# Patient Record
Sex: Male | Born: 1935 | Race: Black or African American | Hispanic: No | Smoking: Current some day smoker
Health system: Southern US, Community
[De-identification: ages and names within clinical notes are randomized; demographics above are authoritative.]

## PROBLEM LIST (undated history)

## (undated) DIAGNOSIS — E611 Iron deficiency: Secondary | ICD-10-CM

## (undated) DIAGNOSIS — J449 Chronic obstructive pulmonary disease, unspecified: Secondary | ICD-10-CM

## (undated) DIAGNOSIS — D649 Anemia, unspecified: Secondary | ICD-10-CM

## (undated) DIAGNOSIS — F32A Depression, unspecified: Secondary | ICD-10-CM

## (undated) DIAGNOSIS — M109 Gout, unspecified: Secondary | ICD-10-CM

## (undated) DIAGNOSIS — F329 Major depressive disorder, single episode, unspecified: Secondary | ICD-10-CM

## (undated) DIAGNOSIS — N189 Chronic kidney disease, unspecified: Secondary | ICD-10-CM

## (undated) DIAGNOSIS — I10 Essential (primary) hypertension: Secondary | ICD-10-CM

## (undated) HISTORY — DX: Chronic kidney disease, unspecified: N18.9

## (undated) HISTORY — DX: Gout, unspecified: M10.9

## (undated) HISTORY — DX: Essential (primary) hypertension: I10

## (undated) HISTORY — PX: THYROIDECTOMY: SHX17

## (undated) HISTORY — PX: NO PAST SURGERIES: SHX2092

## (undated) HISTORY — DX: Iron deficiency: E61.1

---

## 2005-07-08 ENCOUNTER — Ambulatory Visit: Payer: Self-pay | Admitting: Family Medicine

## 2012-05-19 DIAGNOSIS — Z720 Tobacco use: Secondary | ICD-10-CM | POA: Insufficient documentation

## 2012-05-19 DIAGNOSIS — I10 Essential (primary) hypertension: Secondary | ICD-10-CM | POA: Insufficient documentation

## 2013-08-14 ENCOUNTER — Emergency Department: Payer: Self-pay | Admitting: Emergency Medicine

## 2014-01-16 DIAGNOSIS — N183 Chronic kidney disease, stage 3 unspecified: Secondary | ICD-10-CM | POA: Insufficient documentation

## 2014-01-16 DIAGNOSIS — M109 Gout, unspecified: Secondary | ICD-10-CM | POA: Insufficient documentation

## 2014-02-28 ENCOUNTER — Ambulatory Visit: Payer: Self-pay | Admitting: Ophthalmology

## 2014-02-28 DIAGNOSIS — I10 Essential (primary) hypertension: Secondary | ICD-10-CM

## 2014-02-28 LAB — POTASSIUM: Potassium: 4 mmol/L (ref 3.5–5.1)

## 2014-03-19 ENCOUNTER — Ambulatory Visit: Payer: Self-pay | Admitting: Ophthalmology

## 2014-06-26 ENCOUNTER — Ambulatory Visit: Payer: Self-pay | Admitting: Ophthalmology

## 2014-06-26 LAB — POTASSIUM: POTASSIUM: 4 mmol/L (ref 3.5–5.1)

## 2014-06-26 LAB — HEMOGLOBIN: HGB: 11.5 g/dL — AB (ref 13.0–18.0)

## 2014-07-09 ENCOUNTER — Ambulatory Visit: Payer: Self-pay | Admitting: Ophthalmology

## 2014-12-08 ENCOUNTER — Emergency Department: Payer: Self-pay | Admitting: Emergency Medicine

## 2014-12-08 LAB — COMPREHENSIVE METABOLIC PANEL
Albumin: 3 g/dL — ABNORMAL LOW
Alkaline Phosphatase: 94 U/L
Anion Gap: 8 (ref 7–16)
BUN: 48 mg/dL — ABNORMAL HIGH
Bilirubin,Total: 0.7 mg/dL
CO2: 29 mmol/L
Calcium, Total: 8.1 mg/dL — ABNORMAL LOW
Chloride: 99 mmol/L — ABNORMAL LOW
Creatinine: 3.17 mg/dL — ABNORMAL HIGH
EGFR (African American): 21 — ABNORMAL LOW
GFR CALC NON AF AMER: 18 — AB
Glucose: 95 mg/dL
Potassium: 4.3 mmol/L
SGOT(AST): 28 U/L
SGPT (ALT): 16 U/L — ABNORMAL LOW
Sodium: 136 mmol/L
TOTAL PROTEIN: 7.2 g/dL

## 2014-12-08 LAB — CBC WITH DIFFERENTIAL/PLATELET
BASOS ABS: 0.1 10*3/uL (ref 0.0–0.1)
Basophil %: 0.9 %
EOS ABS: 0 10*3/uL (ref 0.0–0.7)
Eosinophil %: 0.4 %
HCT: 35.5 % — AB (ref 40.0–52.0)
HGB: 11.4 g/dL — AB (ref 13.0–18.0)
Lymphocyte #: 0.9 10*3/uL — ABNORMAL LOW (ref 1.0–3.6)
Lymphocyte %: 12 %
MCH: 28.3 pg (ref 26.0–34.0)
MCHC: 32 g/dL (ref 32.0–36.0)
MCV: 89 fL (ref 80–100)
Monocyte #: 0.5 x10 3/mm (ref 0.2–1.0)
Monocyte %: 6.6 %
NEUTROS ABS: 6.1 10*3/uL (ref 1.4–6.5)
NEUTROS PCT: 80.1 %
Platelet: 226 10*3/uL (ref 150–440)
RBC: 4.01 10*6/uL — AB (ref 4.40–5.90)
RDW: 14.4 % (ref 11.5–14.5)
WBC: 7.6 10*3/uL (ref 3.8–10.6)

## 2014-12-08 LAB — PRO B NATRIURETIC PEPTIDE: B-Type Natriuretic Peptide: 1048 pg/mL — ABNORMAL HIGH

## 2014-12-12 DIAGNOSIS — N184 Chronic kidney disease, stage 4 (severe): Secondary | ICD-10-CM | POA: Insufficient documentation

## 2014-12-26 DIAGNOSIS — I119 Hypertensive heart disease without heart failure: Secondary | ICD-10-CM | POA: Insufficient documentation

## 2015-01-04 NOTE — Op Note (Signed)
PATIENT NAME:  Eric Yu, Eric Yu MR#:  [id number] DATE OF BIRTH:  1934/07/14  DATE OF PROCEDURE:  07/09/2014  PREOPERATIVE DIAGNOSIS:  Nuclear sclerotic cataract of the right eye.   POSTOPERATIVE DIAGNOSIS:  Nuclear sclerotic cataract of the right eye.   OPERATIVE PROCEDURE:  Cataract extraction by phacoemulsification with implant of intraocular lens to right eye.   SURGEON:  Galen ManilaWilliam Adalynd Donahoe, MD.   ANESTHESIA:  1. Managed anesthesia care.  2. Topical tetracaine drops followed by 2% Xylocaine jelly applied in the preoperative holding area.   COMPLICATIONS:  None.   TECHNIQUE:   Stop and chop.   DESCRIPTION OF PROCEDURE:  The patient was examined and consented in the preoperative holding area where the aforementioned topical anesthesia was applied to the right eye and then brought back to the Operating Room where the right eye was prepped and draped in the usual sterile ophthalmic fashion and a lid speculum was placed. A paracentesis was created with the side port blade and the anterior chamber was filled with viscoelastic. A near clear corneal incision was performed with the steel keratome. A continuous curvilinear capsulorrhexis was performed with a cystotome followed by the capsulorrhexis forceps. Hydrodissection and hydrodelineation were carried out with BSS on a blunt cannula. The lens was removed in a stop and chop technique and the remaining cortical material was removed with the irrigation-aspiration handpiece. The capsular bag was inflated with viscoelastic and the Tecnis ZCB00 23.5-diopter lens, serial number [id number] was placed in the capsular bag without complication. The remaining viscoelastic was removed from the eye with the irrigation-aspiration handpiece. The wounds were hydrated. The anterior chamber was flushed with Miostat and the eye was inflated to physiologic pressure. 0.1 mL of cefuroxime concentration 10 mg/mL was placed in the anterior chamber. The wounds were found to be water  tight. The eye was dressed with Vigamox. The patient was given protective glasses to wear throughout the day and a shield with which to sleep tonight. The patient was also given drops with which to begin a drop regimen today and will follow up with me in one day.    ____________________________ Jerilee FieldWilliam L. Antwoin Lackey, MD wlp:nb D: 07/09/2014 15:37:14 ET T: 07/10/2014 03:43:19 ET JOB#: [id number]  cc: Andrew Soria L. Zaydan Papesh, MD, <Dictator> Jerilee FieldWILLIAM L Corderro Koloski MD ELECTRONICALLY SIGNED 07/10/2014 14:13

## 2015-01-04 NOTE — Op Note (Signed)
PATIENT NAME:  Eric Yu, Eric Yu MR#:  [id number] DATE OF BIRTH:  1934/04/09  DATE OF PROCEDURE:  03/19/2014  PREOPERATIVE DIAGNOSIS: Visually significant cataract of the left eye.   POSTOPERATIVE DIAGNOSIS: Visually significant cataract of the left eye.   OPERATIVE PROCEDURE: Cataract extraction by phacoemulsification with implant of intraocular lens to left eye.   SURGEON: Galen ManilaWilliam Kevonta Phariss, MD.   ANESTHESIA:  1. Managed anesthesia care.  2. Topical tetracaine drops followed by 2% Xylocaine jelly applied in the preoperative holding area.   COMPLICATIONS: None.   TECHNIQUE:  Stop and chop.  DESCRIPTION OF PROCEDURE: The patient was examined and consented in the preoperative holding area where the aforementioned topical anesthesia was applied to the left eye and then brought back to the Operating Room where the left eye was prepped and draped in the usual sterile ophthalmic fashion and a lid speculum was placed. A paracentesis was created with the side port blade and the anterior chamber was filled with viscoelastic. A near clear corneal incision was performed with the steel keratome. A continuous curvilinear capsulorrhexis was performed with a cystotome followed by the capsulorrhexis forceps. Hydrodissection and hydrodelineation were carried out with BSS on a blunt cannula. The lens was removed in a stop and chop technique and the remaining cortical material was removed with the irrigation-aspiration handpiece. The capsular bag was inflated with viscoelastic and the Alcon SN60WF Tecnis ZCB00 24.0-diopter lens, serial number [id number], was placed in the capsular bag without complication. The remaining viscoelastic was removed from the eye with the irrigation-aspiration handpiece. The wounds were hydrated. The anterior chamber was flushed with Miostat and the eye was inflated to physiologic pressure. 0.1 mL of cefuroxime concentration 10 mg/mL was placed in the anterior chamber. The wounds were found to  be water tight. The eye was dressed with Vigamox. The patient was given protective glasses to wear throughout the day and a shield with which to sleep tonight. The patient was also given drops with which to begin a drop regimen today and will follow-up with me in one day.     ____________________________ Jerilee FieldWilliam L. Kaidan Harpster, MD wlp:cg D: 03/19/2014 22:39:44 ET T: 03/20/2014 01:45:54 ET JOB#: [id number]  cc: Massimo Hartland L. Sanoe Hazan, MD, <Dictator> Jerilee FieldWILLIAM L Alabama Doig MD ELECTRONICALLY SIGNED 04/10/2014 17:05

## 2015-01-06 DIAGNOSIS — IMO0002 Reserved for concepts with insufficient information to code with codable children: Secondary | ICD-10-CM | POA: Insufficient documentation

## 2015-01-08 DIAGNOSIS — S37009A Unspecified injury of unspecified kidney, initial encounter: Secondary | ICD-10-CM | POA: Insufficient documentation

## 2015-05-22 ENCOUNTER — Other Ambulatory Visit: Payer: Self-pay | Admitting: Internal Medicine

## 2015-05-22 DIAGNOSIS — N289 Disorder of kidney and ureter, unspecified: Secondary | ICD-10-CM

## 2015-05-28 ENCOUNTER — Ambulatory Visit
Admission: RE | Admit: 2015-05-28 | Discharge: 2015-05-28 | Disposition: A | Discharge status: Home / Self Care | Payer: Medicare HMO | Source: Ambulatory Visit | Attending: Internal Medicine | Admitting: Internal Medicine

## 2015-05-28 DIAGNOSIS — R7989 Other specified abnormal findings of blood chemistry: Secondary | ICD-10-CM | POA: Diagnosis not present

## 2015-05-28 DIAGNOSIS — N289 Disorder of kidney and ureter, unspecified: Secondary | ICD-10-CM | POA: Insufficient documentation

## 2015-07-29 ENCOUNTER — Ambulatory Visit (INDEPENDENT_AMBULATORY_CARE_PROVIDER_SITE_OTHER): Payer: Medicare HMO | Admitting: Urology

## 2015-07-29 ENCOUNTER — Encounter: Payer: Self-pay | Admitting: Urology

## 2015-07-29 VITALS — BP 170/74 | HR 102 | Ht 68.0 in | Wt 165.8 lb

## 2015-07-29 DIAGNOSIS — N4889 Other specified disorders of penis: Secondary | ICD-10-CM | POA: Diagnosis not present

## 2015-07-29 DIAGNOSIS — N489 Disorder of penis, unspecified: Secondary | ICD-10-CM

## 2015-07-29 MED ORDER — CEPHALEXIN 500 MG PO CAPS: 500.0000 mg | ORAL_CAPSULE | Freq: Three times a day (TID) | ORAL | Status: AC

## 2015-07-29 MED ORDER — CEPHALEXIN 500 MG PO CAPS: 500.0000 mg | ORAL_CAPSULE | Freq: Four times a day (QID) | ORAL | Status: DC

## 2015-07-29 NOTE — Progress Notes (Signed)
81 year old male who presents today to clinic for evaluation and management of a penile lesion.   The patient was referred by Dr. Einar CrowMarshall Anderson, M.D.   The patient states that his lesion has been present for the past 7 days.  It is located on the dorsal aspect the corona with involvement of the foreskin in that area. The area has not enlarged over the past week.  He was started on fluconazole for 5 days and had little improvement in the size and character of the lesion. The patient denies any associated pain. He denies any purulence. He denies any associated trauma. The patient states that prior to this the area was normal in appearance. Denies any fevers or chills. Denies a progressive voiding symptoms.  The patient denies any inguinal lymph adenopathy or associated inguinal pain.The patient does have urinary incontinence baseline.  The patient also has chronic kidney disease. He is a nondiabetic.  No current outpatient prescriptions on file prior to visit.   No current facility-administered medications on file prior to visit.   Past Medical History  Diagnosis Date  . Hypertension   . Gout   . Iron deficiency   . Chronic kidney disease    Past Surgical History  Procedure Laterality Date  . No past surgeries    . Thyroidectomy     Social History  Substance Use Topics  . Smoking status: Current Every Day Smoker  . Smokeless tobacco: Never Used  . Alcohol Use: No   Family History  Problem Relation Age of Onset  . Cancer Sister   . Hypertension Sister   . Urolithiasis Neg Hx   . Prostate cancer Neg Hx   . Kidney disease Neg Hx   . Kidney cancer Neg Hx    NAD Filed Vitals:   07/29/15 1431  Height: 5\' 8"  (1.727 m)  Weight: 165 lb 12.8 oz (75.206 kg)   alert and oriented 3  Nonlabored breathing  No significant lower extremities swelling ,  Extremities well-perfused  abdomen is soft, nontender, nondistended  No inguinal lymphadenopathy  Patient has proximally 1 cm lesion  on the left side  On the dorsal tip of the phallus encroaching on the glans and extending into the foreskin. The skin around is indurated. There is some fibrinous  Exudate around the ulcerative lesion.   This is an isolated lesion, there are no additional lesions on the phallus within the genitalia. Scrotum is otherwise normal.  Extremities are symmetric without pitting edema  Skin is otherwise unremarkable  Neurologically, the patient appears to be intact grossly  The patient has a non-labile mood with a bright affect.  Imp:  The patient appears to have an infection along the distal shaft of the penis involving the foreskin and extending into the corona of the glans penis. He experienced no improvement on fluconazole. According to the patient, this is a new lesion. Recommendation: I recommended that the patient be treated with one week of Keflex every 8 hours. In addition, I instructed the patient on cleaning the area well twice daily and then applying Neosporin to the effected area. I'll plan to follow up with the patient 10 days. At that point, if  the patient's lesion is no better, he may need a biopsy or circumcision of the area. This would be both curative and diagnostic of any penile neoplasm.   Cc: Dr. Einar CrowMarshall Anderson, M.D.

## 2015-08-13 ENCOUNTER — Ambulatory Visit: Payer: Medicare HMO

## 2016-04-26 ENCOUNTER — Emergency Department: Payer: Medicare HMO

## 2016-04-26 ENCOUNTER — Inpatient Hospital Stay
Admission: EM | Admit: 2016-04-26 | Discharge: 2016-05-03 | DRG: 682 | Disposition: A | Discharge status: Home Health | Payer: Medicare HMO | Attending: Internal Medicine | Admitting: Internal Medicine

## 2016-04-26 ENCOUNTER — Encounter
Admission: EM | Disposition: A | Discharge status: Home Health | Payer: Self-pay | Source: Home / Self Care | Attending: Internal Medicine

## 2016-04-26 DIAGNOSIS — D631 Anemia in chronic kidney disease: Secondary | ICD-10-CM | POA: Diagnosis present

## 2016-04-26 DIAGNOSIS — I248 Other forms of acute ischemic heart disease: Secondary | ICD-10-CM | POA: Diagnosis present

## 2016-04-26 DIAGNOSIS — N179 Acute kidney failure, unspecified: Secondary | ICD-10-CM

## 2016-04-26 DIAGNOSIS — N186 End stage renal disease: Secondary | ICD-10-CM | POA: Diagnosis present

## 2016-04-26 DIAGNOSIS — M109 Gout, unspecified: Secondary | ICD-10-CM | POA: Diagnosis present

## 2016-04-26 DIAGNOSIS — Z8249 Family history of ischemic heart disease and other diseases of the circulatory system: Secondary | ICD-10-CM | POA: Diagnosis not present

## 2016-04-26 DIAGNOSIS — Z992 Dependence on renal dialysis: Secondary | ICD-10-CM | POA: Diagnosis not present

## 2016-04-26 DIAGNOSIS — I472 Ventricular tachycardia: Secondary | ICD-10-CM | POA: Diagnosis present

## 2016-04-26 DIAGNOSIS — Z87891 Personal history of nicotine dependence: Secondary | ICD-10-CM | POA: Diagnosis not present

## 2016-04-26 DIAGNOSIS — E872 Acidosis: Secondary | ICD-10-CM | POA: Diagnosis present

## 2016-04-26 DIAGNOSIS — N189 Chronic kidney disease, unspecified: Secondary | ICD-10-CM

## 2016-04-26 DIAGNOSIS — N2581 Secondary hyperparathyroidism of renal origin: Secondary | ICD-10-CM | POA: Diagnosis present

## 2016-04-26 DIAGNOSIS — R45851 Suicidal ideations: Secondary | ICD-10-CM | POA: Diagnosis not present

## 2016-04-26 DIAGNOSIS — G4700 Insomnia, unspecified: Secondary | ICD-10-CM | POA: Diagnosis present

## 2016-04-26 DIAGNOSIS — E8770 Fluid overload, unspecified: Secondary | ICD-10-CM | POA: Diagnosis present

## 2016-04-26 DIAGNOSIS — I120 Hypertensive chronic kidney disease with stage 5 chronic kidney disease or end stage renal disease: Principal | ICD-10-CM | POA: Diagnosis present

## 2016-04-26 DIAGNOSIS — E871 Hypo-osmolality and hyponatremia: Secondary | ICD-10-CM | POA: Diagnosis present

## 2016-04-26 DIAGNOSIS — Z79899 Other long term (current) drug therapy: Secondary | ICD-10-CM

## 2016-04-26 DIAGNOSIS — IMO0001 Reserved for inherently not codable concepts without codable children: Secondary | ICD-10-CM

## 2016-04-26 DIAGNOSIS — R531 Weakness: Secondary | ICD-10-CM

## 2016-04-26 DIAGNOSIS — E875 Hyperkalemia: Secondary | ICD-10-CM | POA: Diagnosis present

## 2016-04-26 DIAGNOSIS — F322 Major depressive disorder, single episode, severe without psychotic features: Secondary | ICD-10-CM | POA: Diagnosis present

## 2016-04-26 HISTORY — PX: PERIPHERAL VASCULAR CATHETERIZATION: SHX172C

## 2016-04-26 LAB — RENAL FUNCTION PANEL
ANION GAP: 13 (ref 5–15)
Albumin: 3.4 g/dL — ABNORMAL LOW (ref 3.5–5.0)
BUN: 82 mg/dL — ABNORMAL HIGH (ref 6–20)
CHLORIDE: 100 mmol/L — AB (ref 101–111)
CO2: 21 mmol/L — AB (ref 22–32)
Calcium: 8.9 mg/dL (ref 8.9–10.3)
Creatinine, Ser: 7.45 mg/dL — ABNORMAL HIGH (ref 0.61–1.24)
GFR calc non Af Amer: 6 mL/min — ABNORMAL LOW (ref >60)
GFR, EST AFRICAN AMERICAN: 7 mL/min — AB (ref >60)
Glucose, Bld: 83 mg/dL (ref 65–99)
Phosphorus: 5.4 mg/dL — ABNORMAL HIGH (ref 2.5–4.6)
Potassium: 4.8 mmol/L (ref 3.5–5.1)
Sodium: 134 mmol/L — ABNORMAL LOW (ref 135–145)

## 2016-04-26 LAB — CBC
HCT: 29.8 % — ABNORMAL LOW (ref 40.0–52.0)
HEMATOCRIT: 30.1 % — AB (ref 40.0–52.0)
HEMOGLOBIN: 10 g/dL — AB (ref 13.0–18.0)
HEMOGLOBIN: 9.8 g/dL — AB (ref 13.0–18.0)
MCH: 28 pg (ref 26.0–34.0)
MCH: 27.7 pg (ref 26.0–34.0)
MCHC: 33.3 g/dL (ref 32.0–36.0)
MCHC: 32.9 g/dL (ref 32.0–36.0)
MCV: 84 fL (ref 80.0–100.0)
MCV: 84.3 fL (ref 80.0–100.0)
PLATELETS: 239 10*3/uL (ref 150–440)
Platelets: 235 10*3/uL (ref 150–440)
RBC: 3.59 MIL/uL — ABNORMAL LOW (ref 4.40–5.90)
RBC: 3.54 MIL/uL — AB (ref 4.40–5.90)
RDW: 16.8 % — AB (ref 11.5–14.5)
RDW: 16.8 % — ABNORMAL HIGH (ref 11.5–14.5)
WBC: 4.6 10*3/uL (ref 3.8–10.6)
WBC: 7 10*3/uL (ref 3.8–10.6)

## 2016-04-26 LAB — BASIC METABOLIC PANEL
ANION GAP: 15 (ref 5–15)
BUN: 118 mg/dL — ABNORMAL HIGH (ref 6–20)
CHLORIDE: 96 mmol/L — AB (ref 101–111)
CO2: 19 mmol/L — AB (ref 22–32)
Calcium: 8.9 mg/dL (ref 8.9–10.3)
Creatinine, Ser: 9.43 mg/dL — ABNORMAL HIGH (ref 0.61–1.24)
GFR calc Af Amer: 5 mL/min — ABNORMAL LOW (ref >60)
GFR, EST NON AFRICAN AMERICAN: 5 mL/min — AB (ref >60)
GLUCOSE: 102 mg/dL — AB (ref 65–99)
POTASSIUM: 5.2 mmol/L — AB (ref 3.5–5.1)
Sodium: 130 mmol/L — ABNORMAL LOW (ref 135–145)

## 2016-04-26 LAB — URINALYSIS COMPLETE WITH MICROSCOPIC (ARMC ONLY)
BILIRUBIN URINE: NEGATIVE
Bacteria, UA: NONE SEEN
Glucose, UA: NEGATIVE mg/dL
Hgb urine dipstick: NEGATIVE
KETONES UR: NEGATIVE mg/dL
LEUKOCYTES UA: NEGATIVE
Nitrite: NEGATIVE
PH: 5 (ref 5.0–8.0)
PROTEIN: 100 mg/dL — AB
SPECIFIC GRAVITY, URINE: 1.008 (ref 1.005–1.030)
Squamous Epithelial / LPF: NONE SEEN

## 2016-04-26 LAB — TROPONIN I: Troponin I: 0.09 ng/mL (ref <0.03)

## 2016-04-26 LAB — CREATININE, SERUM
Creatinine, Ser: 7.45 mg/dL — ABNORMAL HIGH (ref 0.61–1.24)
GFR calc Af Amer: 7 mL/min — ABNORMAL LOW (ref >60)
GFR calc non Af Amer: 6 mL/min — ABNORMAL LOW (ref >60)

## 2016-04-26 LAB — GLUCOSE, CAPILLARY: Glucose-Capillary: 99 mg/dL (ref 65–99)

## 2016-04-26 SURGERY — DIALYSIS/PERMA CATHETER INSERTION
Anesthesia: Moderate Sedation

## 2016-04-26 MED ORDER — NIFEDIPINE ER OSMOTIC RELEASE 30 MG PO TB24
30.0000 mg | ORAL_TABLET | Freq: Every day | ORAL | Status: DC
  Administered 2016-04-26 – 2016-05-03 (×7): 30 mg via ORAL
  Filled 2016-04-26 (×10): qty 1

## 2016-04-26 MED ORDER — SODIUM CHLORIDE 0.9 % IV SOLN: 250.0000 mL | INTRAVENOUS | Status: DC | PRN

## 2016-04-26 MED ORDER — HEPARIN SODIUM (PORCINE) 5000 UNIT/ML IJ SOLN
5000.0000 [IU] | Freq: Three times a day (TID) | INTRAMUSCULAR | Status: DC
  Administered 2016-04-26 – 2016-05-02 (×17): 5000 [IU] via SUBCUTANEOUS
  Filled 2016-04-26 (×17): qty 1

## 2016-04-26 MED ORDER — ALTEPLASE 2 MG IJ SOLR
2.0000 mg | Freq: Once | INTRAMUSCULAR | Status: DC | PRN
  Filled 2016-04-26: qty 2

## 2016-04-26 MED ORDER — SODIUM CHLORIDE 0.9% FLUSH
3.0000 mL | Freq: Two times a day (BID) | INTRAVENOUS | Status: DC
  Administered 2016-04-26 – 2016-05-03 (×12): 3 mL via INTRAVENOUS

## 2016-04-26 MED ORDER — SENNOSIDES-DOCUSATE SODIUM 8.6-50 MG PO TABS: 1.0000 | ORAL_TABLET | Freq: Every evening | ORAL | Status: DC | PRN

## 2016-04-26 MED ORDER — ACETAMINOPHEN 650 MG RE SUPP: 650.0000 mg | Freq: Four times a day (QID) | RECTAL | Status: DC | PRN

## 2016-04-26 MED ORDER — ACETAMINOPHEN 325 MG PO TABS: 650.0000 mg | ORAL_TABLET | Freq: Four times a day (QID) | ORAL | Status: DC | PRN

## 2016-04-26 MED ORDER — HYDRALAZINE HCL 50 MG PO TABS
100.0000 mg | ORAL_TABLET | Freq: Three times a day (TID) | ORAL | Status: DC
  Administered 2016-04-26 – 2016-05-03 (×16): 100 mg via ORAL
  Filled 2016-04-26 (×17): qty 2

## 2016-04-26 MED ORDER — SODIUM CHLORIDE 0.9 % IV SOLN: INTRAVENOUS | Status: DC

## 2016-04-26 MED ORDER — HEPARIN SODIUM (PORCINE) 10000 UNIT/ML IJ SOLN
INTRAMUSCULAR | Status: AC
Start: 2016-04-26 — End: 2016-04-26
  Filled 2016-04-26: qty 1

## 2016-04-26 MED ORDER — ALLOPURINOL 100 MG PO TABS
100.0000 mg | ORAL_TABLET | Freq: Every day | ORAL | Status: DC
  Administered 2016-04-26 – 2016-05-03 (×6): 100 mg via ORAL
  Filled 2016-04-26 (×6): qty 1

## 2016-04-26 MED ORDER — LISINOPRIL 20 MG PO TABS
20.0000 mg | ORAL_TABLET | Freq: Every day | ORAL | Status: DC
  Administered 2016-04-26 – 2016-04-27 (×2): 20 mg via ORAL
  Filled 2016-04-26 (×2): qty 1

## 2016-04-26 MED ORDER — ONDANSETRON HCL 4 MG PO TABS: 4.0000 mg | ORAL_TABLET | Freq: Four times a day (QID) | ORAL | Status: DC | PRN

## 2016-04-26 MED ORDER — SODIUM CHLORIDE 0.9 % IV SOLN
Freq: Once | INTRAVENOUS | Status: AC
  Administered 2016-04-26: 11:00:00 via INTRAVENOUS

## 2016-04-26 MED ORDER — FENTANYL CITRATE (PF) 100 MCG/2ML IJ SOLN
INTRAMUSCULAR | Status: DC | PRN
  Administered 2016-04-26: 50 ug via INTRAVENOUS

## 2016-04-26 MED ORDER — VITAMIN D 1000 UNITS PO TABS
1000.0000 [IU] | ORAL_TABLET | Freq: Every day | ORAL | Status: DC
  Administered 2016-04-26 – 2016-05-03 (×6): 1000 [IU] via ORAL
  Filled 2016-04-26 (×6): qty 1

## 2016-04-26 MED ORDER — SODIUM CHLORIDE 0.9 % IV SOLN: 100.0000 mL | INTRAVENOUS | Status: DC | PRN

## 2016-04-26 MED ORDER — AMLODIPINE BESYLATE 10 MG PO TABS
10.0000 mg | ORAL_TABLET | Freq: Every day | ORAL | Status: DC
  Administered 2016-04-26 – 2016-05-03 (×7): 10 mg via ORAL
  Filled 2016-04-26 (×7): qty 1

## 2016-04-26 MED ORDER — PENTAFLUOROPROP-TETRAFLUOROETH EX AERO
1.0000 "application " | INHALATION_SPRAY | CUTANEOUS | Status: DC | PRN
  Filled 2016-04-26: qty 30

## 2016-04-26 MED ORDER — ASPIRIN EC 81 MG PO TBEC
81.0000 mg | DELAYED_RELEASE_TABLET | Freq: Every day | ORAL | Status: DC
  Administered 2016-04-26 – 2016-05-03 (×6): 81 mg via ORAL
  Filled 2016-04-26 (×6): qty 1

## 2016-04-26 MED ORDER — LIDOCAINE HCL (PF) 1 % IJ SOLN
5.0000 mL | INTRAMUSCULAR | Status: DC | PRN
  Filled 2016-04-26: qty 5

## 2016-04-26 MED ORDER — SODIUM CHLORIDE 0.9 % IV SOLN
INTRAVENOUS | Status: DC
  Administered 2016-04-26: 14:00:00 via INTRAVENOUS

## 2016-04-26 MED ORDER — ALBUTEROL SULFATE (2.5 MG/3ML) 0.083% IN NEBU
2.5000 mg | INHALATION_SOLUTION | Freq: Every day | RESPIRATORY_TRACT | Status: DC
  Administered 2016-04-26 – 2016-05-03 (×7): 2.5 mg via RESPIRATORY_TRACT
  Filled 2016-04-26 (×8): qty 3

## 2016-04-26 MED ORDER — ACETAMINOPHEN 325 MG PO TABS: 325.0000 mg | ORAL_TABLET | Freq: Every day | ORAL | Status: DC

## 2016-04-26 MED ORDER — LIDOCAINE-PRILOCAINE 2.5-2.5 % EX CREA
1.0000 "application " | TOPICAL_CREAM | CUTANEOUS | Status: DC | PRN
  Filled 2016-04-26: qty 5

## 2016-04-26 MED ORDER — FERROUS SULFATE 325 (65 FE) MG PO TABS
325.0000 mg | ORAL_TABLET | Freq: Every day | ORAL | Status: DC
  Administered 2016-04-26 – 2016-05-03 (×7): 325 mg via ORAL
  Filled 2016-04-26 (×7): qty 1

## 2016-04-26 MED ORDER — FENTANYL CITRATE (PF) 100 MCG/2ML IJ SOLN
INTRAMUSCULAR | Status: AC
  Filled 2016-04-26: qty 2

## 2016-04-26 MED ORDER — CEFUROXIME SODIUM 1.5 G IJ SOLR: 1.5000 g | Freq: Three times a day (TID) | INTRAMUSCULAR | Status: DC

## 2016-04-26 MED ORDER — CEFUROXIME SODIUM 1.5 G IJ SOLR
1.5000 g | Freq: Three times a day (TID) | INTRAMUSCULAR | Status: DC
  Administered 2016-04-26: 1.5 g via INTRAVENOUS

## 2016-04-26 MED ORDER — ONDANSETRON HCL 4 MG/2ML IJ SOLN
4.0000 mg | Freq: Four times a day (QID) | INTRAMUSCULAR | Status: DC | PRN
  Administered 2016-04-27: 4 mg via INTRAVENOUS
  Filled 2016-04-26: qty 2

## 2016-04-26 MED ORDER — HEPARIN SODIUM (PORCINE) 1000 UNIT/ML DIALYSIS
1000.0000 [IU] | INTRAMUSCULAR | Status: DC | PRN
  Filled 2016-04-26: qty 1

## 2016-04-26 MED ORDER — CLONIDINE HCL 0.1 MG PO TABS
0.2000 mg | ORAL_TABLET | Freq: Two times a day (BID) | ORAL | Status: DC
  Administered 2016-04-26 – 2016-05-03 (×13): 0.2 mg via ORAL
  Filled 2016-04-26 (×13): qty 2

## 2016-04-26 MED ORDER — MIDAZOLAM HCL 2 MG/2ML IJ SOLN
INTRAMUSCULAR | Status: DC | PRN
  Administered 2016-04-26: 2 mg via INTRAVENOUS

## 2016-04-26 MED ORDER — MIDAZOLAM HCL 2 MG/2ML IJ SOLN
INTRAMUSCULAR | Status: AC
  Filled 2016-04-26: qty 4

## 2016-04-26 MED ORDER — SODIUM CHLORIDE 0.9% FLUSH: 3.0000 mL | INTRAVENOUS | Status: DC | PRN

## 2016-04-26 SURGICAL SUPPLY — 9 items
BIOPATCH RED 1 DISK 7.0 (GAUZE/BANDAGES/DRESSINGS) ×2 IMPLANT
BIOPATCH RED 1IN DISK 7.0MM (GAUZE/BANDAGES/DRESSINGS) ×1
CATH PALINDROME RT-P 15FX19CM (CATHETERS) ×3 IMPLANT
DERMABOND ADVANCED (GAUZE/BANDAGES/DRESSINGS) ×2
DERMABOND ADVANCED .7 DNX12 (GAUZE/BANDAGES/DRESSINGS) ×1 IMPLANT
PACK ANGIOGRAPHY (CUSTOM PROCEDURE TRAY) ×3 IMPLANT
SUT MNCRL AB 4-0 PS2 18 (SUTURE) ×3 IMPLANT
SUT PROLENE 0 CT 1 30 (SUTURE) ×3 IMPLANT
TOWEL OR 17X26 4PK STRL BLUE (TOWEL DISPOSABLE) ×3 IMPLANT

## 2016-04-26 NOTE — ED Notes (Signed)
Pt's sister is stepping out and left her cell number: [phone] Jeanelle MallingErma Lloyd

## 2016-04-26 NOTE — Progress Notes (Signed)
END OF HD 

## 2016-04-26 NOTE — ED Notes (Signed)
Pt transported to specials. 

## 2016-04-26 NOTE — Progress Notes (Signed)
HD START 

## 2016-04-26 NOTE — ED Notes (Signed)
Pt transported to dialysis

## 2016-04-26 NOTE — Op Note (Signed)
OPERATIVE NOTE    PRE-OPERATIVE DIAGNOSIS: 1. ESRD  POST-OPERATIVE DIAGNOSIS: same as above  PROCEDURE: 1. Ultrasound guidance for vascular access to the right internal jugular vein 2. Fluoroscopic guidance for placement of catheter 3. Placement of a 19 cm tip to cuff tunneled hemodialysis catheter via the right internal jugular vein  SURGEON: Festus BarrenJason Clarabel Marion, MD  ANESTHESIA:  Local with Moderate conscious sedation for approximately 20 minutes using 2 mg of Versed and 50 mcg of Fentanyl  ESTIMATED BLOOD LOSS: 20 cc  FLUORO TIME: 0.1 minutes  CONTRAST: 0 cc  FINDING(S): 1.  Patent right internal jugular vein  SPECIMEN(S):  None  INDICATIONS:   Eric Yu is a 82 y.o. male who presents with uremic symptoms and known CKD now progressed to ESRD.  He needs to start HD today.  The patient needs long term dialysis access for their ESRD, and a Permcath is necessary.  Risks and benefits are discussed and informed consent is obtained.    DESCRIPTION: After obtaining full informed written consent, the patient was brought back to the vascular suited. The patient's right neck and chest were sterilely prepped and draped in a sterile surgical field was created. Moderate conscious sedation was administered during a face to face encounter with the patient throughout the procedure with my supervision of the RN administering medicines and monitoring the patient's vital signs, pulse oximetry, telemetry and mental status throughout from the start of the procedure until the patient was taken to the recovery room.  The right internal jugular vein was visualized with ultrasound and found to be patent. It was then accessed under direct ultrasound guidance and a permanent image was recorded. A wire was placed. After skin nick and dilatation, the peel-away sheath was placed over the wire. I then turned my attention to an area under the clavicle. Approximately 1-2 fingerbreadths below the clavicle a small  counterincision was created and tunneled from the subclavicular incision to the access site. Using fluoroscopic guidance, a 19 centimeter tip to cuff tunneled hemodialysis catheter was selected, and tunneled from the subclavicular incision to the access site. It was then placed through the peel-away sheath and the peel-away sheath was removed. Using fluoroscopic guidance the catheter tips were parked in the right atrium. The appropriate distal connectors were placed. It withdrew blood well and flushed easily with heparinized saline and a concentrated heparin solution was then placed. It was secured to the chest wall with 2 Prolene sutures. The access incision was closed single 4-0 Monocryl. A 4-0 Monocryl pursestring suture was placed around the exit site. Sterile dressings were placed. The patient tolerated the procedure well and was taken to the recovery room in stable condition.  COMPLICATIONS: None  CONDITION: Stable  Faizah Kandler  04/26/2016, 2:29 PM

## 2016-04-26 NOTE — Progress Notes (Signed)
POST HD VITALS 

## 2016-04-26 NOTE — ED Notes (Signed)
[facility], pt was started on Lasix and a Nicotine patch, pt woke up this morning stating he " felt not right", pt unable to verbalize symptoms, pt denies pain

## 2016-04-26 NOTE — ED Provider Notes (Addendum)
[facility] Emergency Department Provider Note        Time seen: ----------------------------------------- 9:23 AM on 04/26/2016 -----------------------------------------    I have reviewed the triage vital signs and the nursing notes.   HISTORY  Chief Complaint Weakness    HPI Eric Yu is a 82 y.o. male who presents to ER being brought by EMS from home complaining of generalized ill feeling.Patient states he's had poor by mouth intake, has had some shortness of breath and diarrhea. He states recently his doctor has changed some of his medicines. He is not sure which. He denies fevers, chills, chest pain, vomiting or other complaints at this time.   Past Medical History:  Diagnosis Date  . Chronic kidney disease   . Gout   . Hypertension   . Iron deficiency     Patient Active Problem List   Diagnosis Date Noted  . Injury of kidney 01/08/2015  . Blood loss, postoperative 01/06/2015  . Hypertensive left ventricular hypertrophy 12/26/2014  . Chronic kidney disease, stage IV (severe) (HCC) 12/12/2014  . Chronic kidney disease (CKD), stage III (moderate) 01/16/2014  . Gout 01/16/2014  . Benign hypertension 05/19/2012  . Current tobacco use 05/19/2012    Past Surgical History:  Procedure Laterality Date  . NO PAST SURGERIES    . THYROIDECTOMY      Allergies Review of patient's allergies indicates no known allergies.  Social History Social History  Substance Use Topics  . Smoking status: Former Smoker    Quit date: 04/16/2016  . Smokeless tobacco: Never Used     Comment: pt is currently using nicotine patches  . Alcohol use No    Review of Systems Constitutional: Negative for fever. Cardiovascular: Negative for chest pain. Respiratory: Positive shortness of breath Gastrointestinal: Negative for abdominal pain, Positive for diarrhea Genitourinary: Negative for dysuria. Musculoskeletal: Negative for back pain. Skin: Negative for  rash. Neurological: Negative for headaches, Positive for weakness  10-point ROS otherwise negative.  ____________________________________________   PHYSICAL EXAM:  VITAL SIGNS: ED Triage Vitals  Enc Vitals Group     BP 04/26/16 0909 (!) 180/57     Pulse Rate 04/26/16 0909 61     Resp 04/26/16 0909 18     Temp 04/26/16 0909 97.9 F (36.6 C)     Temp Source 04/26/16 0909 Oral     SpO2 04/26/16 0909 96 %     Weight 04/26/16 0911 159 lb (72.1 kg)     Height 04/26/16 0911 5\' 8"  (1.727 m)     Head Circumference --      Peak Flow --      Pain Score --      Pain Loc --      Pain Edu? --      Excl. in GC? --     Constitutional: Alert and oriented. No acute distress Eyes: Conjunctivae are normal. PERRL. Normal extraocular movements. ENT   Head: Normocephalic and atraumatic.   Nose: No congestion/rhinnorhea.   Mouth/Throat: Mucous membranes are moist.   Neck: No stridor. Cardiovascular: Normal rate, regular rhythm. No murmurs, rubs, or gallops. Respiratory: Normal respiratory effort without tachypnea nor retractions. Breath sounds are clear and equal bilaterally. No wheezes/rales/rhonchi. Gastrointestinal: Soft and nontender. Normal bowel sounds Musculoskeletal: Nontender with normal range of motion in all extremities. No lower extremity tenderness nor edema. Neurologic:  Normal speech and language. No gross focal neurologic deficits are appreciated. Generalized weakness, nothing focal. Skin:  Skin is warm, dry and intact. No rash noted.  Psychiatric: Mood and affect are normal. Speech and behavior are normal.  ____________________________________________  EKG: Interpreted by me.Sinus rhythm with a rate of 59 bpm, first-degree AV block, normal QRS size, normal QT interval. Possible old anterior infarct, nonspecific T wave inversion  ____________________________________________  ED COURSE:  Pertinent labs & imaging results that were available during my care of the  patient were reviewed by me and considered in my medical decision making (see chart for details). Clinical Course  Patient with vague weakness complaint. We will check basic labs and imaging.  Procedures ____________________________________________   LABS (pertinent positives/negatives)  Labs Reviewed  BASIC METABOLIC PANEL - Abnormal; Notable for the following:       Result Value   Sodium 130 (*)    Potassium 5.2 (*)    Chloride 96 (*)    CO2 19 (*)    Glucose, Bld 102 (*)    BUN 118 (*)    Creatinine, Ser 9.43 (*)    GFR calc non Af Amer 5 (*)    GFR calc Af Amer 5 (*)    All other components within normal limits  CBC - Abnormal; Notable for the following:    RBC 3.59 (*)    Hemoglobin 10.0 (*)    HCT 30.1 (*)    RDW 16.8 (*)    All other components within normal limits  GLUCOSE, CAPILLARY  URINALYSIS COMPLETEWITH MICROSCOPIC ([facility] ONLY)  TROPONIN I  CBG MONITORING, ED    RADIOLOGY Images were viewed by me  CT head, chest x-ray IMPRESSION: Nodular density overlying the right mid lung. Further evaluation by means of CT of the chest is recommended. IMPRESSION: Nodular density overlying the right mid lung. Further evaluation by means of CT of the chest is recommended.  ____________________________________________  FINAL ASSESSMENT AND PLAN  Weakness, Acute on chronic renal failure, Elevated troponin  Plan: Patient with labs and imaging as dictated above. Patient presents with weakness likely from his renal failure. I will discuss with nephrology and the hospitalist for admission. Patient will likely need dialysis if this does not improve. I have started him on a gentle saline infusion. Patient's troponin elevation is likely due to renal failure. He has not had chest pain.   Emily FilbertWilliams, Dana Dorner E, MD   Note: This dictation was prepared with Dragon dictation. Any transcriptional errors that result from this process are unintentional    Emily FilbertJonathan E Salinda Snedeker,  MD 04/26/16 1105    Emily FilbertJonathan E Brettany Sydney, MD 04/26/16 1116

## 2016-04-26 NOTE — ED Triage Notes (Signed)
Pt comes into the ED via EMS from home with c/o generalized feeling of just not feeling well today.. Denies any pain , pt is in NAD, SR noted with PVC..Marland Kitchen

## 2016-04-26 NOTE — Consult Note (Signed)
[facility] Vascular Consult Note  MRN : [id number]  Eric Yu is a 82 y.o. (08-14-1934) male who presents with chief complaint of  Chief Complaint  Patient presents with  . Weakness  .  History of Present Illness: I am asked by Dr. Cherylann Ratel to see the patient for dialysis access.  The patient presents with shortness of breath and mental status changes. He has known chronic kidney disease but has now progressed to what appears to be end-stage renal failure. His creatinine clearance is only 5 and his BUN is just over 100. He has chronic hypertension as an underlying cause and is quite hypertensive today. He has shortness of breath even at rest and this has progressed steadily over several days. He is also become more confused and lethargic. He has no fever or chills. He does not have chest pain.  Current Facility-Administered Medications  Medication Dose Route Frequency Provider Last Rate Last Dose  . 0.9 %  sodium chloride infusion   Intravenous Continuous Annice Needy, MD      . cefUROXime (ZINACEF) injection 1.5 g  1.5 g Intravenous Q8H Annice Needy, MD   1.5 g at 04/26/16 1401  . fentaNYL (SUBLIMAZE) injection    PRN Annice Needy, MD   50 mcg at 04/26/16 1401  . midazolam (VERSED) injection    PRN Annice Needy, MD   2 mg at 04/26/16 1402    Past Medical History:  Diagnosis Date  . Chronic kidney disease   . Gout   . Hypertension   . Iron deficiency     Past Surgical History:  Procedure Laterality Date  . NO PAST SURGERIES    . THYROIDECTOMY      Social History Social History  Substance Use Topics  . Smoking status: Former Smoker    Quit date: 04/16/2016  . Smokeless tobacco: Never Used     Comment: pt is currently using nicotine patches  . Alcohol use No  No IV drug use  Family History Family History  Problem Relation Age of Onset  . Cancer Sister   . Hypertension Sister   . Urolithiasis Neg Hx   . Prostate cancer Neg Hx   . Kidney disease Neg  Hx   . Kidney cancer Neg Hx   No bleeding or clotting disorders  No Known Allergies   REVIEW OF SYSTEMS (Negative unless checked)  Constitutional: [] Weight loss  [] Fever  [] Chills Cardiac: [] Chest pain   [] Chest pressure   [] Palpitations   [x] Shortness of breath when laying flat   [] Shortness of breath at rest   [x] Shortness of breath with exertion. Vascular:  [] Pain in legs with walking   [] Pain in legs at rest   [] Pain in legs when laying flat   [] Claudication   [] Pain in feet when walking  [] Pain in feet at rest  [] Pain in feet when laying flat   [] History of DVT   [] Phlebitis   [x] Swelling in legs   [] Varicose veins   [] Non-healing ulcers Pulmonary:   [] Uses home oxygen   [] Productive cough   [] Hemoptysis   [] Wheeze  [] COPD   [] Asthma Neurologic:  [] Dizziness  [] Blackouts   [] Seizures   [] History of stroke   [] History of TIA  [] Aphasia   [] Temporary blindness   [] Dysphagia   [] Weakness or numbness in arms   [] Weakness or numbness in legs Musculoskeletal:  [] Arthritis   [] Joint swelling   [] Joint pain   [] Low back pain Hematologic:  [] Easy  bruising  [] Easy bleeding   [] Hypercoagulable state   [] Anemic  [] Hepatitis Gastrointestinal:  [] Blood in stool   [] Vomiting blood  [] Gastroesophageal reflux/heartburn   [] Difficulty swallowing. Genitourinary:  [x] Chronic kidney disease   [] Difficult urination  [] Frequent urination  [] Burning with urination   [] Blood in urine Skin:  [] Rashes   [] Ulcers   [] Wounds Psychological:  [] History of anxiety   []  History of major depression.  Physical Examination  Vitals:   04/26/16 1220 04/26/16 1230 04/26/16 1300 04/26/16 1332  BP:  (!) 164/62 (!) 163/65 (!) 161/60  Pulse: (!) 55 (!) 54 (!) 56 (!) 57  Resp: 14 15 16 15   Temp:    97.9 F (36.6 C)  TempSrc:    Oral  SpO2: 97% 97% 94% 96%  Weight:      Height:       Body mass index is 24.18 kg/m. Gen:  WD/WN, NAD Head: [place]/AT, No temporalis wasting. Prominent temp pulse not noted. Ear/Nose/Throat:  Hearing grossly intact, nares w/o erythema or drainage, oropharynx w/o Erythema/Exudate Eyes: PERRLA, EOMI.  Neck: Supple, no nuchal rigidity.  No JVD.  Pulmonary:  Good air movement, Diminished with crackles bilaterally. Using some accessory muscles with inspiration Cardiac: RRR, normal S1, S2 Vascular:  Vessel Right Left  Radial Palpable Palpable                                   Gastrointestinal: soft, non-tender/non-distended. No guarding/reflex. No masses, surgical incisions, or scars. Musculoskeletal: M/S 5/5 throughout.  Extremities without ischemic changes.  No deformity or atrophy. Mild to moderate lower extremity edema. Neurologic: CN 2-12 intact. Pain and light touch intact in extremities.  Symmetrical.  Speech is fluent. Motor exam as listed above. Psychiatric: Difficult to assess but he has a little bit somnolent. He does respond some questioning appropriately. Dermatologic: No rashes or ulcers noted.  No cellulitis or open wounds. Lymph : No Cervical, Axillary, or Inguinal lymphadenopathy.     CBC Lab Results  Component Value Date   WBC 4.6 04/26/2016   HGB 10.0 (L) 04/26/2016   HCT 30.1 (L) 04/26/2016   MCV 84.0 04/26/2016   PLT 239 04/26/2016    BMET    Component Value Date/Time   NA 130 (L) 04/26/2016 0919   NA 136 12/08/2014 1207   K 5.2 (H) 04/26/2016 0919   K 4.3 12/08/2014 1207   CL 96 (L) 04/26/2016 0919   CL 99 (L) 12/08/2014 1207   CO2 19 (L) 04/26/2016 0919   CO2 29 12/08/2014 1207   GLUCOSE 102 (H) 04/26/2016 0919   GLUCOSE 95 12/08/2014 1207   BUN 118 (H) 04/26/2016 0919   BUN 48 (H) 12/08/2014 1207   CREATININE 9.43 (H) 04/26/2016 0919   CREATININE 3.17 (H) 12/08/2014 1207   CALCIUM 8.9 04/26/2016 0919   CALCIUM 8.1 (L) 12/08/2014 1207   GFRNONAA 5 (L) 04/26/2016 0919   GFRNONAA 18 (L) 12/08/2014 1207   GFRAA 5 (L) 04/26/2016 0919   GFRAA 21 (L) 12/08/2014 1207   Estimated Creatinine Clearance: 6.1 mL/min (by C-G formula based  on SCr of 9.43 mg/dL).  COAG No results found for: INR, PROTIME  Radiology Dg Chest 1 View  Result Date: 04/26/2016 CLINICAL DATA:  Weakness EXAM: CHEST 1 VIEW COMPARISON:  12/08/2014 FINDINGS: Cardiac shadow is again enlarged. Mild aortic calcifications are noted. The lungs are well aerated bilaterally. There is a 15 mm nodular appearing density overlying  the right mid lung. This likely represents a calcified granuloma although further workup is recommended. No bony abnormality is seen. IMPRESSION: Nodular density overlying the right mid lung. Further evaluation by means of CT of the chest is recommended. Electronically Signed   By: Alcide CleverMark  Lukens M.D.   On: 04/26/2016 09:58   Ct Head Wo Contrast  Result Date: 04/26/2016 CLINICAL DATA:  Generalized weakness EXAM: CT HEAD WITHOUT CONTRAST TECHNIQUE: Contiguous axial images were obtained from the base of the skull through the vertex without intravenous contrast. COMPARISON:  None. FINDINGS: Bony calvarium is intact. Diffuse atrophic changes are noted. Scattered areas of chronic white matter ischemic change are noted. Prior lacunar infarct is noted within the right thalamus. No acute hemorrhage, acute infarction or space-occupying mass lesion is noted. IMPRESSION: Chronic atrophic in ischemic changes. Electronically Signed   By: Alcide CleverMark  Lukens M.D.   On: 04/26/2016 10:19     Assessment/Plan 1. Renal failure. Now with end-stage renal disease and need for dialysis today. He has volume overload and uremic symptoms with a BUN just over 100. Discussed the case with Dr. Cherylann RatelLateef from nephrology and we will place a PermCath today. Discussed with the patient either a temporary catheter today and PermCath placement in 2-3 days versus immediate PermCath placement and he prefers the latter. Also discussed that he will need outpatient workup for long-term dialysis access versus placement while he is in the hospital if he is stable enough. 2. Hypertension. Significant.  Likely an underlying cause of his renal failure. Should be improved with dialysis. 3. Anemia of chronic disease. Likely from his underlying renal insufficiency and chronic illness. Procrit with dialysis and iron supplements as needed.   Mckennon Zwart, MD  04/26/2016 2:02 PM

## 2016-04-26 NOTE — Progress Notes (Signed)
PRE HD ASSESSMENT 

## 2016-04-26 NOTE — H&P (Signed)
[facility] [place] [facility]   PATIENT NAME: Eric Yu    MR#:  [id number]  DATE OF BIRTH:  12/06/1933  DATE OF ADMISSION:  04/26/2016  PRIMARY CARE PHYSICIAN: Lauro RegulusANDERSON,MARSHALL W., MD   REQUESTING/REFERRING PHYSICIAN: Dr. Mayford KnifeWilliams  CHIEF COMPLAINT:    Generalized weakness HISTORY OF PRESENT ILLNESS:  Eric Yu  is a 82 y.o. male with a known history of Chronic kidney disease stage IV with baseline creatinine of 5 who presents with above complaint. Patient reports over the past few days he has not been feeling well. He denies shortness of breath however does state that he has been more confused than usual. He denies lower extremity edema or crease in abdominal girth. He denies chest pain, nausea or vomiting. In the emergency room creatinine is noted to be 9.43.   PAST MEDICAL HISTORY:   Past Medical History:  Diagnosis Date  . Chronic kidney disease   . Gout   . Hypertension   . Iron deficiency     PAST SURGICAL HISTORY:   Past Surgical History:  Procedure Laterality Date  . NO PAST SURGERIES    . THYROIDECTOMY      SOCIAL HISTORY:   Social History  Substance Use Topics  . Smoking status: Former Smoker    Quit date: 04/16/2016  . Smokeless tobacco: Never Used     Comment: pt is currently using nicotine patches  . Alcohol use No    FAMILY HISTORY:   Family History  Problem Relation Age of Onset  . Cancer Sister   . Hypertension Sister   . Urolithiasis Neg Hx   . Prostate cancer Neg Hx   . Kidney disease Neg Hx   . Kidney cancer Neg Hx     DRUG ALLERGIES:  No Known Allergies  REVIEW OF SYSTEMS:   Review of Systems  Constitutional: Positive for malaise/fatigue. Negative for chills and fever.  HENT: Negative.  Negative for ear discharge, ear pain, hearing loss, nosebleeds and sore throat.   Eyes: Negative.  Negative for blurred vision and pain.  Respiratory: Negative.  Negative for cough, hemoptysis, shortness of breath and  wheezing.   Cardiovascular: Negative.  Negative for chest pain, palpitations and leg swelling.  Gastrointestinal: Negative.  Negative for abdominal pain, blood in stool, diarrhea, nausea and vomiting.  Genitourinary: Negative.  Negative for dysuria.  Musculoskeletal: Negative.  Negative for back pain.  Skin: Negative.   Neurological: Positive for weakness. Negative for dizziness, tremors, speech change, focal weakness, seizures and headaches.  Endo/Heme/Allergies: Negative.  Does not bruise/bleed easily.  Psychiatric/Behavioral: Negative.  Negative for depression, hallucinations and suicidal ideas.    MEDICATIONS AT HOME:   Prior to Admission medications   Medication Sig Start Date End Date Taking? Authorizing Provider  acetaminophen (TYLENOL) 325 MG tablet Take 650 mg by mouth. 01/10/15   Historical Provider, MD  allopurinol (ZYLOPRIM) 100 MG tablet Take 100 mg by mouth.    Historical Provider, MD  cloNIDine (CATAPRES) 0.3 MG tablet Take 0.3 mg by mouth. 04/09/15   Historical Provider, MD  ferrous sulfate 325 (65 FE) MG tablet Take 325 mg by mouth daily with breakfast.    Historical Provider, MD  lisinopril (PRINIVIL,ZESTRIL) 20 MG tablet Take 20 mg by mouth. 07/04/14   Historical Provider, MD  NIFEdipine (PROCARDIA-XL/ADALAT CC) 60 MG 24 hr tablet  12/19/13   Historical Provider, MD  torsemide (DEMADEX) 20 MG tablet Take 20 mg by mouth. 05/20/15   Historical Provider, MD  VITAL SIGNS:  Blood pressure (!) 162/63, pulse (!) 55, temperature 97.9 F (36.6 C), temperature source Oral, resp. rate 14, height 5\' 8"  (1.727 m), weight 72.1 kg (159 lb), SpO2 97 %.  PHYSICAL EXAMINATION:   Physical Exam  Constitutional: He is oriented to person, place, and time and well-developed, well-nourished, and in no distress. No distress.  HENT:  Head: Normocephalic.  Pulsating carotid artery  Eyes: No scleral icterus.  Neck: Normal range of motion. Neck supple. No JVD present. No tracheal deviation  present.  Cardiovascular: Normal rate, regular rhythm and normal heart sounds.  Exam reveals no gallop and no friction rub.   No murmur heard. Pulmonary/Chest: Effort normal and breath sounds normal. No respiratory distress. He has no wheezes. He has no rales. He exhibits no tenderness.  Abdominal: Soft. Bowel sounds are normal. He exhibits no distension and no mass. There is no tenderness. There is no rebound and no guarding.  Musculoskeletal: Normal range of motion. He exhibits no edema.  Neurological: He is alert and oriented to person, place, and time.  Skin: Skin is warm. No rash noted. No erythema.  Psychiatric: Affect and judgment normal.      LABORATORY PANEL:   CBC  Recent Labs Lab 04/26/16 0919  WBC 4.6  HGB 10.0*  HCT 30.1*  PLT 239   ------------------------------------------------------------------------------------------------------------------  Chemistries   Recent Labs Lab 04/26/16 0919  NA 130*  K 5.2*  CL 96*  CO2 19*  GLUCOSE 102*  BUN 118*  CREATININE 9.43*  CALCIUM 8.9   ------------------------------------------------------------------------------------------------------------------  Cardiac Enzymes  Recent Labs Lab 04/26/16 0919  TROPONINI 0.09*   ------------------------------------------------------------------------------------------------------------------  RADIOLOGY:  Dg Chest 1 View  Result Date: 04/26/2016 CLINICAL DATA:  Weakness EXAM: CHEST 1 VIEW COMPARISON:  12/08/2014 FINDINGS: Cardiac shadow is again enlarged. Mild aortic calcifications are noted. The lungs are well aerated bilaterally. There is a 15 mm nodular appearing density overlying the right mid lung. This likely represents a calcified granuloma although further workup is recommended. No bony abnormality is seen. IMPRESSION: Nodular density overlying the right mid lung. Further evaluation by means of CT of the chest is recommended. Electronically Signed   By: Alcide Clever M.D.   On: 04/26/2016 09:58   Ct Head Wo Contrast  Result Date: 04/26/2016 CLINICAL DATA:  Generalized weakness EXAM: CT HEAD WITHOUT CONTRAST TECHNIQUE: Contiguous axial images were obtained from the base of the skull through the vertex without intravenous contrast. COMPARISON:  None. FINDINGS: Bony calvarium is intact. Diffuse atrophic changes are noted. Scattered areas of chronic white matter ischemic change are noted. Prior lacunar infarct is noted within the right thalamus. No acute hemorrhage, acute infarction or space-occupying mass lesion is noted. IMPRESSION: Chronic atrophic in ischemic changes. Electronically Signed   By: Alcide Clever M.D.   On: 04/26/2016 10:19    EKG:  Sinus rhythm heart rate 59 for severe AV block no ST elevation. There is old anterior infarct  IMPRESSION AND PLAN:   82 year old male with chronic kidney disease stage IV seen by [facility] nephrology presents with generalized weakness and found to have acute on chronic kidney injury.  1. Acute on chronic kidney failure: Patient has now approached dialysis. Patient will have permacath placed and will start hemodialysis. Discussed case with Dr. Cherylann Ratel.  2. Hyponatremia: Stop torsemide. 3. Hyperkalemia: This is in the setting of increased creatinine.  4. Elevated troponin: This is due to poor renal clearance and unlikely ACS. Follow troponins.  5. Essential hypertension: Continue clonidine,  lisinopril and nifedipine.   All the records are reviewed and case discussed with ED provider. Management plans discussed with the patient and he in agreement  CODE STATUS: FULL  TOTAL TIME TAKING CARE OF THIS PATIENT: 50 minutes.    Iyanla Eilers M.D on 04/26/2016 at 12:25 PM  Between 7am to 6pm - Pager - [phone]  After 6pm go to [email or web address] - password [email or web address]  [facility]  Office  [phone]  CC: Primary care physician; Lauro RegulusANDERSON,MARSHALL W., MD

## 2016-04-26 NOTE — Progress Notes (Signed)
Post HD Assessment 

## 2016-04-26 NOTE — H&P (Signed)
  [facility] History & Physical Update  The patient was interviewed and re-examined.  The patient's previous History and Physical has been reviewed and is unchanged.  There is no change in the plan of care. We plan to proceed with the scheduled procedure.  DEW,JASON, MD  04/26/2016, 1:58 PM

## 2016-04-26 NOTE — Consult Note (Signed)
[facility] CONSULT NOTE    Date: 04/26/2016                  Patient Name:  Eric Yu  MRN: [id number]  DOB: November 01, 1933  Age / Sex: 82 y.o., male         PCP: Lauro Regulus., MD                 Service Requesting Consult: Emergency Department                 Reason for Consult: Suspected ESRD            History of Present Illness: Patient is a 82 y.o. male with a PMHx of Hypertension, gout, chronic kidney disease stage V, anemia of chronic kidney disease, secondary hyperparathyroidism, metabolic acidosis who was admitted to [facility] on 04/26/2016 for evaluation of end-stage renal disease. He was at home with malaise today. He notified his wife of the symptoms and wanted to come in for evaluation. He also has had increasing shortness of breath with minimal exertion. He states that he is maintaining appetite and denies persistent nausea or vomiting.  He is very closely followed by [facility] nephrology. They have been trying to get him into place at tunnel dialysis catheter to initiate dialysis however patient was unable to make it in. He does not have a dialysis access in place. He has significant azotemia with a BUN of 118 and creatinine of 9.4. There is also evidence for hyperkalemia with a serum potassium of 5.2 and metabolic acidosis with a serum bicarbonate of 19. He also has associated anemia of chronic kidney disease with a hemoglobin of 10.0. I discussed the case in depth with the patient and his family members. They're willing to proceed with renal replacement therapy. I've also discussed the case with Dr. Cherly Hensen of [facility] nephrology who was agreeable with this plan.   Medications: Outpatient medications:  (Not in a hospital admission)  Current medications: Current Facility-Administered Medications  Medication Dose Route Frequency Provider Last Rate Last Dose  . 0.9 %  sodium chloride infusion   Intravenous Once [name], MD       Current Outpatient  Prescriptions  Medication Sig Dispense Refill  . acetaminophen (TYLENOL) 325 MG tablet Take 650 mg by mouth.    Marland Kitchen allopurinol (ZYLOPRIM) 100 MG tablet Take 100 mg by mouth.    . cloNIDine (CATAPRES) 0.3 MG tablet Take 0.3 mg by mouth.    . ferrous sulfate 325 (65 FE) MG tablet Take 325 mg by mouth daily with breakfast.    . lisinopril (PRINIVIL,ZESTRIL) 20 MG tablet Take 20 mg by mouth.    Marland Kitchen NIFEdipine (PROCARDIA-XL/ADALAT CC) 60 MG 24 hr tablet     . torsemide (DEMADEX) 20 MG tablet Take 20 mg by mouth.        Allergies: No Known Allergies    Past Medical History: Past Medical History:  Diagnosis Date  . Chronic kidney disease   . Gout   . Hypertension   . Iron deficiency      Past Surgical History: Past Surgical History:  Procedure Laterality Date  . NO PAST SURGERIES    . THYROIDECTOMY       Family History: Family History  Problem Relation Age of Onset  . Cancer Sister   . Hypertension Sister   . Urolithiasis Neg Hx   . Prostate cancer Neg Hx   . Kidney disease Neg Hx   . Kidney  cancer Neg Hx      Social History: Social History   Social History  . Marital status: Widowed    Spouse name: N/A  . Number of children: N/A  . Years of education: N/A   Occupational History  . Not on file.   Social History Main Topics  . Smoking status: Former Smoker    Quit date: 04/16/2016  . Smokeless tobacco: Never Used     Comment: pt is currently using nicotine patches  . Alcohol use No  . Drug use: No  . Sexual activity: Not Currently   Other Topics Concern  . Not on file   Social History Narrative  . No narrative on file     Review of Systems: Review of Systems  Constitutional: Positive for malaise/fatigue. Negative for chills, fever and weight loss.  HENT: Negative for ear pain, hearing loss and tinnitus.   Eyes: Negative for blurred vision and double vision.  Respiratory: Positive for shortness of breath. Negative for cough, hemoptysis and sputum  production.   Cardiovascular: Positive for orthopnea and leg swelling. Negative for chest pain and palpitations.  Gastrointestinal: Negative for heartburn, nausea and vomiting.  Genitourinary: Negative for dysuria, frequency and urgency.  Musculoskeletal: Negative for myalgias and neck pain.  Skin: Positive for rash. Negative for itching.  Neurological: Positive for focal weakness and weakness. Negative for dizziness and headaches.  Endo/Heme/Allergies: Negative for polydipsia.  Psychiatric/Behavioral: Negative for depression. The patient is not nervous/anxious.      Vital Signs: Blood pressure (!) 169/56, pulse (!) 56, temperature 97.9 F (36.6 C), temperature source Oral, resp. rate 16, height 5\' 8"  (1.727 m), weight 72.1 kg (159 lb), SpO2 93 %.  Weight trends: Filed Weights   04/26/16 0911  Weight: 72.1 kg (159 lb)    Physical Exam: General: NAD,   Head: Normocephalic, atraumatic.  Eyes: Anicteric, EOMI  Nose: Mucous membranes moist, not inflammed, nonerythematous.  Throat: Oropharynx nonerythematous, no exudate appreciated.   Neck: Supple, trachea midline.  Lungs:  Normal respiratory effort. Clear to auscultation BL without crackles or wheezes.  Heart: RRR. S1 and S2 normal without gallop, murmur, or rubs.  Abdomen:  BS normoactive. Soft, Nondistended, non-tender.  No masses or organomegaly.  Extremities: No pretibial edema.  Neurologic: A&O X3, Motor strength is 5/5 in the all 4 extremities  Skin: No visible rashes, scars.    Lab results: Basic Metabolic Panel:  Recent Labs Lab 04/26/16 0919  NA 130*  K 5.2*  CL 96*  CO2 19*  GLUCOSE 102*  BUN 118*  CREATININE 9.43*  CALCIUM 8.9    Liver Function Tests: No results for input(s): AST, ALT, ALKPHOS, BILITOT, PROT, ALBUMIN in the last 168 hours. No results for input(s): LIPASE, AMYLASE in the last 168 hours. No results for input(s): AMMONIA in the last 168 hours.  CBC:  Recent Labs Lab 04/26/16 0919   WBC 4.6  HGB 10.0*  HCT 30.1*  MCV 84.0  PLT 239    Cardiac Enzymes:  Recent Labs Lab 04/26/16 0919  TROPONINI 0.09*    BNP: Invalid input(s): POCBNP  CBG:  Recent Labs Lab 04/26/16 0948  GLUCAP 99    Microbiology: No results found for this or any previous visit.  Coagulation Studies: No results for input(s): LABPROT, INR in the last 72 hours.  Urinalysis:  Recent Labs  04/26/16 0919  COLORURINE STRAW*  LABSPEC 1.008  PHURINE 5.0  GLUCOSEU NEGATIVE  HGBUR NEGATIVE  BILIRUBINUR NEGATIVE  KETONESUR NEGATIVE  PROTEINUR 100*  NITRITE NEGATIVE  LEUKOCYTESUR NEGATIVE      Imaging: Dg Chest 1 View  Result Date: 04/26/2016 CLINICAL DATA:  Weakness EXAM: CHEST 1 VIEW COMPARISON:  12/08/2014 FINDINGS: Cardiac shadow is again enlarged. Mild aortic calcifications are noted. The lungs are well aerated bilaterally. There is a 15 mm nodular appearing density overlying the right mid lung. This likely represents a calcified granuloma although further workup is recommended. No bony abnormality is seen. IMPRESSION: Nodular density overlying the right mid lung. Further evaluation by means of CT of the chest is recommended. Electronically Signed   By: Alcide CleverMark  Lukens M.D.   On: 04/26/2016 09:58   Ct Head Wo Contrast  Result Date: 04/26/2016 CLINICAL DATA:  Generalized weakness EXAM: CT HEAD WITHOUT CONTRAST TECHNIQUE: Contiguous axial images were obtained from the base of the skull through the vertex without intravenous contrast. COMPARISON:  None. FINDINGS: Bony calvarium is intact. Diffuse atrophic changes are noted. Scattered areas of chronic white matter ischemic change are noted. Prior lacunar infarct is noted within the right thalamus. No acute hemorrhage, acute infarction or space-occupying mass lesion is noted. IMPRESSION: Chronic atrophic in ischemic changes. Electronically Signed   By: Alcide CleverMark  Lukens M.D.   On: 04/26/2016 10:19      Assessment & Plan: Pt is a 82 y.o.  male with a PMHx of Hypertension, gout, chronic kidney disease stage V, anemia of chronic kidney disease, secondary hyperparathyroidism, metabolic acidosis who was [facility] 04[facility]26[facility]2016 for evaluation of end-stage renal disease.   1. End-stage renal disease [facility]. It appears that the patient has approached end-stage renal disease. [facility]ly [facility]hang of [facility] has been attempting to place a tunneled dialysis catheter and the patient however family had some [facility]. Therefore they brought him here. I have discussed the case with the patient, family, and Dr. Cherly Hensenhang. We will proceed with renal replacement therapy initiation. We will have a temperature dialysis catheter placed given the azotemia now and transition him to a PermCath. He will need more longer term access as well.  2. Anemia of chronic kidney disease. Hemoglobin currently 10.0. We may need to consider starting Procrit but will follow hemoglobin trend for now.  3. Secondary hyperparathyroidism. We will check intact PTH and phosphorus with dialysis.  4. Metabolic acidosis. Serum bicarbonate currently 19. This should be corrected with the initiation of hemodialysis.  5. Hypertension.  The patient was maintained on amlodipine, carvedilol, clonidine, and lisinopril as an outpatient. We recommend restarting these medications.

## 2016-04-26 NOTE — Progress Notes (Signed)
PRE HD INFO 

## 2016-04-27 ENCOUNTER — Encounter: Payer: Self-pay | Admitting: Vascular Surgery

## 2016-04-27 LAB — BASIC METABOLIC PANEL WITH GFR
Anion gap: 12 (ref 5–15)
BUN: 87 mg/dL — ABNORMAL HIGH (ref 6–20)
CO2: 23 mmol/L (ref 22–32)
Calcium: 8.6 mg/dL — ABNORMAL LOW (ref 8.9–10.3)
Chloride: 101 mmol/L (ref 101–111)
Creatinine, Ser: 7.62 mg/dL — ABNORMAL HIGH (ref 0.61–1.24)
GFR calc Af Amer: 7 mL/min — ABNORMAL LOW
GFR calc non Af Amer: 6 mL/min — ABNORMAL LOW
Glucose, Bld: 86 mg/dL (ref 65–99)
Potassium: 4.8 mmol/L (ref 3.5–5.1)
Sodium: 136 mmol/L (ref 135–145)

## 2016-04-27 LAB — HEPATITIS B SURFACE ANTIGEN: Hepatitis B Surface Ag: NEGATIVE

## 2016-04-27 LAB — HEPATITIS B SURFACE ANTIBODY,QUALITATIVE: Hep B S Ab: NONREACTIVE

## 2016-04-27 LAB — MRSA PCR SCREENING: MRSA by PCR: NEGATIVE

## 2016-04-27 MED ORDER — NICOTINE 21 MG/24HR TD PT24
21.0000 mg | MEDICATED_PATCH | Freq: Every day | TRANSDERMAL | Status: DC
  Administered 2016-04-27 – 2016-05-03 (×7): 21 mg via TRANSDERMAL
  Filled 2016-04-27 (×7): qty 1

## 2016-04-27 MED ORDER — LISINOPRIL 20 MG PO TABS
20.0000 mg | ORAL_TABLET | Freq: Every day | ORAL | Status: DC
  Administered 2016-04-28 – 2016-05-02 (×5): 20 mg via ORAL
  Filled 2016-04-27 (×5): qty 1

## 2016-04-27 NOTE — Progress Notes (Signed)
PRE HD INFO 

## 2016-04-27 NOTE — Progress Notes (Signed)
Post hd assessment 

## 2016-04-27 NOTE — Progress Notes (Signed)
PRE HD ASSESSMENT 

## 2016-04-27 NOTE — Care Management (Signed)
Notified Ivor ReiningKim Riddle HD liaison of new HD

## 2016-04-27 NOTE — Progress Notes (Signed)
Initial Nutrition Assessment  DOCUMENTATION CODES:   Not applicable  INTERVENTION:  -Pt is edentulous, family planning on bringing dentures from home. Recommend chopped meats in the interim.  -If po intake inadequate on follow-up, recommend addition of nutritional supplement -Provided written and basic verbal introduction to dietary guidelines for patient's starting on dialysis with focus on protein and sodium intake. Also briefly reviewed phosphorus and potassium restriction. Encouraged pt to follow-up with outpatient RD at dialysis center. If able, will provide education to pt's sister and pt's friend as they are ones who cook for the pt   NUTRITION DIAGNOSIS:   Food and nutrition related knowledge deficit related to  (dialysis diet) as evidenced by  (new initiation of dialysis).  GOAL:   Patient will meet greater than or equal to 90% of their needs  MONITOR:   PO intake, Labs, Weight trends  REASON FOR ASSESSMENT:   Malnutrition Screening Tool    ASSESSMENT:    82 yo male admitted with acute on CKD V requiring initiation of dialysis, permcath placed and pt received first dialysis session on 8/14, 2nd session 8/15  Pt reports good appetite at home, eating at least 3 meals per day. Pt unsure if he has had any changes in weight recently. Pt reports his sister and his friend cook all of his meals. Pt reports he uses a cane to ambulate and gets around his house fairly well.  Pt is edentulous, has dentures but not currently. Family planning on bringing in. Pt could not eat pork chop sent on lunch tray today, writer called down for meat loaf instead  Nutrition-Focused physical exam completed. Findings are no to mild fat depletion, no to mildmuscle depletion, and no edema.    Past Medical History:  Diagnosis Date  . Chronic kidney disease   . Gout   . Hypertension   . Iron deficiency      Diet Order:  Diet renal with fluid restriction Fluid restriction: 1200 mL Fluid; Room  service appropriate? Yes; Fluid consistency: Thin  Energy Intake: recorded po intake 100% at breakfast this AM  Skin:  Reviewed, no issues  Last BM:  8/13   Labs: phosphorus 5.4, potassium wdl (5.2 on admission), sodium wdl (130 on admission, BUN 87, Creatinine 7.62  Meds: reviewed  Height:   Ht Readings from Last 1 Encounters:  04/26/16 5\' 8"  (1.727 m)    Weight:   Wt Readings from Last 1 Encounters:  04/27/16 157 lb 13.6 oz (71.6 kg)    BMI:  Body mass index is 24 kg/m.  Estimated Nutritional Needs:   Kcal:  2100-2500 kcals   Protein:  86-108 g  Fluid:  1000 mL plus UOP  EDUCATION NEEDS:   Education needs addressed  Romelle Starcherate Azai Gaffin MS, RD, LDN [phone] Pager  [phone] Weekend/On-Call Pager

## 2016-04-27 NOTE — Progress Notes (Signed)
HD START 

## 2016-04-27 NOTE — Progress Notes (Signed)
[employer] [facility] [place] [facility]   PATIENT NAME: Eric Yu    MR#:  [id number]  DATE OF BIRTH:  1934-07-09  SUBJECTIVE:  CHIEF COMPLAINT:   Chief Complaint  Patient presents with  . Weakness   Came with weakness, have Ac on CH renal failure- now ESRD. Started on HD 04/26/16. Feels better.  REVIEW OF SYSTEMS:  CONSTITUTIONAL: No fever, fatigue or weakness.  EYES: No blurred or double vision.  EARS, NOSE, AND THROAT: No tinnitus or ear pain.  RESPIRATORY: No cough, shortness of breath, wheezing or hemoptysis.  CARDIOVASCULAR: No chest pain, orthopnea, edema.  GASTROINTESTINAL: No nausea, vomiting, diarrhea or abdominal pain.  GENITOURINARY: No dysuria, hematuria.  ENDOCRINE: No polyuria, nocturia,  HEMATOLOGY: No anemia, easy bruising or bleeding SKIN: No rash or lesion. MUSCULOSKELETAL: No joint pain or arthritis.   NEUROLOGIC: No tingling, numbness, weakness.  PSYCHIATRY: No anxiety or depression.   ROS  DRUG ALLERGIES:  No Known Allergies  VITALS:  Blood pressure (!) 150/70, pulse 96, temperature 97.7 F (36.5 C), temperature source Oral, resp. rate 20, height 5\' 8"  (1.727 m), weight 71.6 kg (157 lb 13.6 oz), SpO2 99 %.  PHYSICAL EXAMINATION:  GENERAL:  82 y.o.-year-old patient lying in the bed with no acute distress.  EYES: Pupils equal, round, reactive to light and accommodation. No scleral icterus. Extraocular muscles intact.  HEENT: Head atraumatic, normocephalic. Oropharynx and nasopharynx clear.  NECK:  Supple, no jugular venous distention. No thyroid enlargement, no tenderness.  LUNGS: Normal breath sounds bilaterally, no wheezing, rales,rhonchi or crepitation. No use of accessory muscles of respiration. Right upper Chest dialysis catheter present CARDIOVASCULAR: S1, S2 normal. No murmurs, rubs, or gallops.  ABDOMEN: Soft, nontender, nondistended. Bowel sounds present. No organomegaly or mass.  EXTREMITIES: No pedal edema, cyanosis, or  clubbing.  NEUROLOGIC: Cranial nerves II through XII are intact. Muscle strength 5/5 in all extremities. Sensation intact. Gait not checked.  PSYCHIATRIC: The patient is alert and oriented x 3.  SKIN: No obvious rash, lesion, or ulcer.   Physical Exam LABORATORY PANEL:   CBC  Recent Labs Lab 04/26/16 1917  WBC 7.0  HGB 9.8*  HCT 29.8*  PLT 235   ------------------------------------------------------------------------------------------------------------------  Chemistries   Recent Labs Lab 04/27/16 0450  NA 136  K 4.8  CL 101  CO2 23  GLUCOSE 86  BUN 87*  CREATININE 7.62*  CALCIUM 8.6*   ------------------------------------------------------------------------------------------------------------------  Cardiac Enzymes  Recent Labs Lab 04/26/16 0919  TROPONINI 0.09*   ------------------------------------------------------------------------------------------------------------------  RADIOLOGY:  Dg Chest 1 View  Result Date: 04/26/2016 CLINICAL DATA:  Weakness EXAM: CHEST 1 VIEW COMPARISON:  12/08/2014 FINDINGS: Cardiac shadow is again enlarged. Mild aortic calcifications are noted. The lungs are well aerated bilaterally. There is a 15 mm nodular appearing density overlying the right mid lung. This likely represents a calcified granuloma although further workup is recommended. No bony abnormality is seen. IMPRESSION: Nodular density overlying the right mid lung. Further evaluation by means of CT of the chest is recommended. Electronically Signed   By: Alcide CleverMark  Lukens M.D.   On: 04/26/2016 09:58   Ct Head Wo Contrast  Result Date: 04/26/2016 CLINICAL DATA:  Generalized weakness EXAM: CT HEAD WITHOUT CONTRAST TECHNIQUE: Contiguous axial images were obtained from the base of the skull through the vertex without intravenous contrast. COMPARISON:  None. FINDINGS: Bony calvarium is intact. Diffuse atrophic changes are noted. Scattered areas of chronic white matter ischemic  change are noted. Prior lacunar infarct is noted within the  right thalamus. No acute hemorrhage, acute infarction or space-occupying mass lesion is noted. IMPRESSION: Chronic atrophic in ischemic changes. Electronically Signed   By: Alcide CleverMark  Lukens M.D.   On: 04/26/2016 10:19    ASSESSMENT AND PLAN:   Active Problems:   Acute renal failure (ARF) (HCC)  1. ESRD: Patient has now approached dialysis. Discussed case with Dr. Cherylann RatelLateef. s/p permacath and HD started.  2. Hyponatremia: Stop torsemide. 3. Hyperkalemia: This is in the setting of increased creatinine.  4. Elevated troponin: This is due to poor renal clearance and unlikely ACS. Follow troponins.  5. Essential hypertension: Continue clonidine, lisinopril and nifedipine.   All the records are reviewed and case discussed with Care Management/Social Workerr. Management plans discussed with the patient, family and they are in agreement.  CODE STATUS: full.  TOTAL TIME TAKING CARE OF THIS PATIENT: 35 minutes.     POSSIBLE D/C IN 1-2 DAYS, DEPENDING ON CLINICAL CONDITION.   Altamese DillingVACHHANI, Lenaya Pietsch M.D on 04/27/2016   Between 7am to 6pm - Pager - [phone]  After 6pm go to [email or web address] - password [email or web address]  [facility]  Office  [phone]  CC: Primary care physician; Lauro RegulusANDERSON,MARSHALL W., MD  Note: This dictation was prepared with Dragon dictation along with smaller phrase technology. Any transcriptional errors that result from this process are unintentional.

## 2016-04-27 NOTE — Progress Notes (Signed)
POST HD VITALS 

## 2016-04-27 NOTE — Progress Notes (Signed)
[name]  ROUNDING NOTE   Subjective:  Patient seen and evaluated during second dialysis treatment. Vascular surgery decided to go ahead and place PermCath. He appears to be tolerating the procedure quite well thus far. Overall he feels better as compared to yesterday.   Objective:  Vital signs in last 24 hours:  Temp:  [97.9 F (36.6 C)-98.1 F (36.7 C)] 98.1 F (36.7 C) (08/15 1015) Pulse Rate:  [47-96] 93 (08/15 1050) Resp:  [12-20] 14 (08/15 1050) BP: (144-183)/(60-85) 149/75 (08/15 1050) SpO2:  [93 %-100 %] 99 % (08/15 1050) Weight:  [71.8 kg (158 lb 4.6 oz)] 71.8 kg (158 lb 4.6 oz) (08/15 1015)  Weight change:  Filed Weights   04/26/16 0911 04/27/16 1015  Weight: 72.1 kg (159 lb) 71.8 kg (158 lb 4.6 oz)    Intake/Output: I/O last 3 completed shifts: In: 3 [I.V.:3] Out: 0    Intake/Output this shift:  Total I/O In: 240 [P.O.:240] Out: 0   Physical Exam: General: No acute distress  Head: Normocephalic, atraumatic. Moist oral mucosal membranes  Eyes: Anicteric  Neck: Supple, trachea midline  Lungs:  Clear to auscultation, normal effort  Heart: S1S2 no rubs  Abdomen:  Soft, nontender,   Extremities: No peripheral edema.  Neurologic: Nonfocal, moving all four extremities  Skin: No lesions  Access: R IJ permcath placed 04/26/16    Basic Metabolic Panel:  Recent Labs Lab 04/26/16 0919 04/26/16 1917 04/27/16 0450  NA 130* 134* 136  K 5.2* 4.8 4.8  CL 96* 100* 101  CO2 19* 21* 23  GLUCOSE 102* 83 86  BUN 118* 82* 87*  CREATININE 9.43* 7.45*  7.45* 7.62*  CALCIUM 8.9 8.9 8.6*  PHOS  --  5.4*  --     Liver Function Tests:  Recent Labs Lab 04/26/16 1917  ALBUMIN 3.4*   No results for input(s): LIPASE, AMYLASE in the last 168 hours. No results for input(s): AMMONIA in the last 168 hours.  CBC:  Recent Labs Lab 04/26/16 0919 04/26/16 1917  WBC 4.6 7.0  HGB 10.0* 9.8*  HCT 30.1* 29.8*  MCV 84.0 84.3  PLT 239 235     Cardiac Enzymes:  Recent Labs Lab 04/26/16 0919  TROPONINI 0.09*    BNP: Invalid input(s): POCBNP  CBG:  Recent Labs Lab 04/26/16 0948  GLUCAP 99    Microbiology: Results for orders placed or performed during the hospital encounter of 04/26/16  MRSA PCR Screening     Status: None   Collection Time: 04/26/16  7:05 PM  Result Value Ref Range Status   MRSA by PCR NEGATIVE NEGATIVE Final    Comment:        The GeneXpert MRSA Assay (FDA approved for NASAL specimens only), is one component of a comprehensive MRSA colonization surveillance program. It is not intended to diagnose MRSA infection nor to guide or monitor treatment for MRSA infections.     Coagulation Studies: No results for input(s): LABPROT, INR in the last 72 hours.  Urinalysis:  Recent Labs  04/26/16 0919  COLORURINE STRAW*  LABSPEC 1.008  PHURINE 5.0  GLUCOSEU NEGATIVE  HGBUR NEGATIVE  BILIRUBINUR NEGATIVE  KETONESUR NEGATIVE  PROTEINUR 100*  NITRITE NEGATIVE  LEUKOCYTESUR NEGATIVE      Imaging: Dg Chest 1 View  Result Date: 04/26/2016 CLINICAL DATA:  Weakness EXAM: CHEST 1 VIEW COMPARISON:  12/08/2014 FINDINGS: Cardiac shadow is again enlarged. Mild aortic calcifications are noted. The lungs are well aerated bilaterally. There is a 15 mm nodular  appearing density overlying the right mid lung. This likely represents a calcified granuloma although further workup is recommended. No bony abnormality is seen. IMPRESSION: Nodular density overlying the right mid lung. Further evaluation by means of CT of the chest is recommended. Electronically Signed   By: Alcide CleverMark  Lukens M.D.   On: 04/26/2016 09:58   Ct Head Wo Contrast  Result Date: 04/26/2016 CLINICAL DATA:  Generalized weakness EXAM: CT HEAD WITHOUT CONTRAST TECHNIQUE: Contiguous axial images were obtained from the base of the skull through the vertex without intravenous contrast. COMPARISON:  None. FINDINGS: Bony calvarium is intact.  Diffuse atrophic changes are noted. Scattered areas of chronic white matter ischemic change are noted. Prior lacunar infarct is noted within the right thalamus. No acute hemorrhage, acute infarction or space-occupying mass lesion is noted. IMPRESSION: Chronic atrophic in ischemic changes. Electronically Signed   By: Alcide CleverMark  Lukens M.D.   On: 04/26/2016 10:19     Medications:     . albuterol  2.5 mg Inhalation Daily  . allopurinol  100 mg Oral Daily  . amLODipine  10 mg Oral Daily  . aspirin EC  81 mg Oral Daily  . cholecalciferol  1,000 Units Oral Daily  . cloNIDine  0.2 mg Oral BID  . ferrous sulfate  325 mg Oral Daily  . heparin  5,000 Units Subcutaneous Q8H  . hydrALAZINE  100 mg Oral TID  . lisinopril  20 mg Oral Daily  . NIFEdipine  30 mg Oral Daily  . sodium chloride flush  3 mL Intravenous Q12H   sodium chloride, sodium chloride, sodium chloride, acetaminophen **OR** acetaminophen, alteplase, heparin, lidocaine (PF), lidocaine-prilocaine, ondansetron **OR** ondansetron (ZOFRAN) IV, pentafluoroprop-tetrafluoroeth, senna-docusate, sodium chloride flush  Assessment/ Plan:  82 y.o. male with a PMHx of Hypertension, gout, chronic kidney disease stage V, anemia of chronic kidney disease, secondary hyperparathyroidism, metabolic acidosis who [facility] for evaluation of end-stage renal disease.   1. End-stage renal disease [facility]. It appears that the patient has approached end-stage renal disease. [facility] has been attempting to place a tunneled dialysis catheter, however the patient's family had some difficulty in getting [facility].  - Right internal jugular PermCath was placed yesterday. Patient is seen and evaluated during second dialysis treatment and he appears to be tolerating this well. He [facility] be [facility] as an [facility] and they have requested that patient be placed on [address]  2. Anemia of chronic kidney disease. Hemoglobin currently 9.8. Continue to monitor. We will consider Epogen as an outpatient.  3. Secondary hyperparathyroidism. Phosphorus currently 5.4. This should continue to come down with dialysis. 4. Metabolic acidosis. Serum bicarbonate now normalized at 23 with dialysis. Continue to monitor.  5. Hypertension.  Blood pressure control improved. Blood pressure currently 149/75. Continue hydralazine, lisinopril, nifedipine, and amlodipine.   LOS: 1 Clariza Sickman 8/15/201711:30 AM

## 2016-04-27 NOTE — Progress Notes (Signed)
END OF HD 

## 2016-04-28 DIAGNOSIS — F322 Major depressive disorder, single episode, severe without psychotic features: Secondary | ICD-10-CM

## 2016-04-28 LAB — PARATHYROID HORMONE, INTACT (NO CA): PTH: 310 pg/mL — AB (ref 15–65)

## 2016-04-28 LAB — HEPATITIS B CORE ANTIBODY, TOTAL: HEP B C TOTAL AB: POSITIVE — AB

## 2016-04-28 LAB — HEPATITIS B SURFACE ANTIGEN: HEP B S AG: NEGATIVE

## 2016-04-28 MED ORDER — LORAZEPAM 0.5 MG PO TABS
0.5000 mg | ORAL_TABLET | Freq: Once | ORAL | Status: AC
  Administered 2016-04-28: 0.5 mg via ORAL
  Filled 2016-04-28: qty 1

## 2016-04-28 MED ORDER — EPOETIN ALFA 10000 UNIT/ML IJ SOLN
4000.0000 [IU] | INTRAMUSCULAR | Status: DC
  Administered 2016-04-28: 4000 [IU] via INTRAVENOUS

## 2016-04-28 MED ORDER — FLUOXETINE HCL 10 MG PO CAPS
10.0000 mg | ORAL_CAPSULE | Freq: Every day | ORAL | Status: DC
  Administered 2016-04-28 – 2016-05-03 (×5): 10 mg via ORAL
  Filled 2016-04-28 (×8): qty 1

## 2016-04-28 MED ORDER — EPOETIN ALFA 10000 UNIT/ML IJ SOLN
4000.0000 [IU] | INTRAMUSCULAR | Status: DC
  Administered 2016-04-30 – 2016-05-03 (×2): 4000 [IU] via INTRAVENOUS
  Filled 2016-04-28: qty 1

## 2016-04-28 NOTE — Progress Notes (Signed)
[name]  ROUNDING NOTE   Subjective:  Patient with significantly depressed mood today. Today he states that he no longer wants to live like this. Currently a sitter is with the patient. Thus far he is tolerated initiation of dialysis well. He is due for his third dialysis treatment today. I have also discussed the case with Dr. Cherly Hensenhang his outpatient nephrologist.  Objective:  Vital signs in last 24 hours:  Temp:  [97.2 F (36.2 C)-97.7 F (36.5 C)] 97.2 F (36.2 C) (08/16 1150) Pulse Rate:  [85-98] 93 (08/16 1220) Resp:  [13-20] 18 (08/16 1220) BP: (149-170)/(68-84) 149/84 (08/16 1220) SpO2:  [96 %-99 %] 97 % (08/16 1220) Weight:  [69.6 kg (153 lb 7 oz)-71.6 kg (157 lb 13.6 oz)] 69.6 kg (153 lb 7 oz) (08/16 1150)  Weight change: -0.322 kg (-11.4 oz) Filed Weights   04/27/16 1015 04/27/16 1250 04/28/16 1150  Weight: 71.8 kg (158 lb 4.6 oz) 71.6 kg (157 lb 13.6 oz) 69.6 kg (153 lb 7 oz)    Intake/Output: I/O last 3 completed shifts: In: 243 [P.O.:240; I.V.:3] Out: 600 [Urine:100; Other:500]   Intake/Output this shift:  Total I/O In: 0  Out: 2 [Urine:1; Stool:1]  Physical Exam: General: No acute distress  Head: Normocephalic, atraumatic. Moist oral mucosal membranes  Eyes: Anicteric  Neck: Supple, trachea midline  Lungs:  Clear to auscultation, normal effort  Heart: S1S2 no rubs  Abdomen:  Soft, nontender,   Extremities: No peripheral edema.  Neurologic: Nonfocal, moving all four extremities, depressed mood  Skin: No lesions  Access: R IJ permcath placed 04/26/16    Basic Metabolic Panel:  Recent Labs Lab 04/26/16 0919 04/26/16 1917 04/27/16 0450  NA 130* 134* 136  K 5.2* 4.8 4.8  CL 96* 100* 101  CO2 19* 21* 23  GLUCOSE 102* 83 86  BUN 118* 82* 87*  CREATININE 9.43* 7.45*  7.45* 7.62*  CALCIUM 8.9 8.9 8.6*  PHOS  --  5.4*  --     Liver Function Tests:  Recent Labs Lab 04/26/16 1917  ALBUMIN 3.4*   No results for input(s):  LIPASE, AMYLASE in the last 168 hours. No results for input(s): AMMONIA in the last 168 hours.  CBC:  Recent Labs Lab 04/26/16 0919 04/26/16 1917  WBC 4.6 7.0  HGB 10.0* 9.8*  HCT 30.1* 29.8*  MCV 84.0 84.3  PLT 239 235    Cardiac Enzymes:  Recent Labs Lab 04/26/16 0919  TROPONINI 0.09*    BNP: Invalid input(s): POCBNP  CBG:  Recent Labs Lab 04/26/16 0948  GLUCAP 99    Microbiology: Results for orders placed or performed during the hospital encounter of 04/26/16  MRSA PCR Screening     Status: None   Collection Time: 04/26/16  7:05 PM  Result Value Ref Range Status   MRSA by PCR NEGATIVE NEGATIVE Final    Comment:        The GeneXpert MRSA Assay (FDA approved for NASAL specimens only), is one component of a comprehensive MRSA colonization surveillance program. It is not intended to diagnose MRSA infection nor to guide or monitor treatment for MRSA infections.     Coagulation Studies: No results for input(s): LABPROT, INR in the last 72 hours.  Urinalysis:  Recent Labs  04/26/16 0919  COLORURINE STRAW*  LABSPEC 1.008  PHURINE 5.0  GLUCOSEU NEGATIVE  HGBUR NEGATIVE  BILIRUBINUR NEGATIVE  KETONESUR NEGATIVE  PROTEINUR 100*  NITRITE NEGATIVE  LEUKOCYTESUR NEGATIVE      Imaging: No  results found.   Medications:     . albuterol  2.5 mg Inhalation Daily  . allopurinol  100 mg Oral Daily  . amLODipine  10 mg Oral Daily  . aspirin EC  81 mg Oral Daily  . cholecalciferol  1,000 Units Oral Daily  . cloNIDine  0.2 mg Oral BID  . ferrous sulfate  325 mg Oral Daily  . heparin  5,000 Units Subcutaneous Q8H  . hydrALAZINE  100 mg Oral TID  . lisinopril  20 mg Oral QHS  . nicotine  21 mg Transdermal Daily  . NIFEdipine  30 mg Oral Daily  . sodium chloride flush  3 mL Intravenous Q12H   sodium chloride, sodium chloride, sodium chloride, acetaminophen **OR** acetaminophen, alteplase, heparin, lidocaine (PF), lidocaine-prilocaine,  ondansetron **OR** ondansetron (ZOFRAN) IV, pentafluoroprop-tetrafluoroeth, senna-docusate, sodium chloride flush  Assessment/ Plan:  82 y.o. male with a PMHx of Hypertension, gout, chronic kidney disease stage V, anemia of chronic kidney disease, secondary hyperparathyroidism, metabolic acidosis who was [facility] for evaluation of end-stage renal disease.   1. End-stage renal disease [facility]. It appears that the patient has approached end-stage renal disease. [facility] of [facility] has been attempting to place a tunneled dialysis catheter, however the patient's family had some difficulty in getting the [facility].  - patient has done well with the initiation of dialysis.  However he appears to have significantly depressed mood and states that he does not want to live like this.  A one-to-one sitter has been provided for him at the moment.  I advised the patient that it may take some time for his condition to improve and to give the dialysis chance.  I've also discussed the case with his outpatient nephrologist Dr. Cherly Hensenhang.  She will be by tomorrow to talk with the patient.  The patient prefers to be placed at [facility].  2. Anemia of chronic kidney disease. we will start the patient on Epogen 4000 units IV with dialysis.  3. Secondary hyperparathyroidism.  Most recent phosphorus was 5.4 and is currently acceptable.  Hold off on adding binder therapy at the moment.  4. Metabolic acidosis. Improved with dialysis.  Continue to monitor serum bicarbonate.  5. Hypertension.  Blood pressure currently 149/84. Continue hydralazine, lisinopril, nifedipine, and amlodipine.   LOS: 2 Ustin Cruickshank 8/16/201712:30 PM

## 2016-04-28 NOTE — Progress Notes (Signed)
END OF HD 

## 2016-04-28 NOTE — Progress Notes (Signed)
PRE HD INFO 

## 2016-04-28 NOTE — Progress Notes (Signed)
[employer] [facility] [facility] [place] [facility]   PATIENT NAME: Eric HalsJohn Yu    MR#:  [id number]  DATE OF BIRTH:  [birth year]  SUBJECTIVE:   Depressed had SI earlier this am Does not want to live like this  REVIEW OF SYSTEMS:    Review of Systems  Constitutional: Negative.  Negative for chills, fever and malaise/fatigue.  HENT: Negative.  Negative for ear discharge, ear pain, hearing loss, nosebleeds and sore throat.   Eyes: Negative.  Negative for blurred vision and pain.  Respiratory: Negative.  Negative for cough, hemoptysis, shortness of breath and wheezing.   Cardiovascular: Negative.  Negative for chest pain, palpitations and leg swelling.  Gastrointestinal: Negative.  Negative for abdominal pain, blood in stool, diarrhea, nausea and vomiting.  Genitourinary: Negative.  Negative for dysuria.  Musculoskeletal: Negative.  Negative for back pain.  Skin: Negative.   Neurological: Negative for dizziness, tremors, speech change, focal weakness, seizures and headaches.  Endo/Heme/Allergies: Negative.  Does not bruise/bleed easily.  Psychiatric/Behavioral: Positive for depression and suicidal ideas. Negative for hallucinations.    Tolerating Diet:yes      DRUG ALLERGIES:  No Known Allergies  VITALS:  Blood pressure 140/74, pulse 90, temperature 97.2 F (36.2 C), temperature source Oral, resp. rate 18, height 5\' 8"  (1.727 m), weight 69.6 kg (153 lb 7 oz), SpO2 96 %.  PHYSICAL EXAMINATION:   Physical Exam  Constitutional: He is oriented to person, place, and time and well-developed, well-nourished, and in no distress. No distress.  HENT:  Head: Normocephalic.  Eyes: No scleral icterus.  Neck: Normal range of motion. Neck supple. No JVD present. No tracheal deviation present.  Cardiovascular: Normal rate and regular rhythm.  Exam reveals no gallop and no friction rub.   Murmur heard. Pulmonary/Chest: Effort normal and breath sounds normal. No respiratory distress. He  has no wheezes. He has no rales. He exhibits no tenderness.  Abdominal: Soft. Bowel sounds are normal. He exhibits no distension and no mass. There is no tenderness. There is no rebound and no guarding.  Musculoskeletal: Normal range of motion. He exhibits no edema.  Neurological: He is alert and oriented to person, place, and time.  Skin: Skin is warm. No rash noted. No erythema.  Psychiatric:  Flat depressed      LABORATORY PANEL:   CBC  Recent Labs Lab 04/26/16 1917  WBC 7.0  HGB 9.8*  HCT 29.8*  PLT 235   ------------------------------------------------------------------------------------------------------------------  Chemistries   Recent Labs Lab 04/27/16 0450  NA 136  K 4.8  CL 101  CO2 23  GLUCOSE 86  BUN 87*  CREATININE 7.62*  CALCIUM 8.6*   ------------------------------------------------------------------------------------------------------------------  Cardiac Enzymes  Recent Labs Lab 04/26/16 0919  TROPONINI 0.09*   ------------------------------------------------------------------------------------------------------------------  RADIOLOGY:  No results found.   ASSESSMENT AND PLAN:    82 year old male with a history of chronic kidney disease stage V now with end-stage renal disease [facility] and hypertension who presented for evaluation for ESRD.  1. ESRD: Patient has done well with initiation of dialysis. Today is his third dialysis today. He will have evaluation by psychiatry due to depression and suicidal thoughts. If he will continue dialysis then case management will assist with placement.   2. Depression with suicidal ideation: Corporate investment bankerContinue sitter and obtain psychiatry consult.  3. Anemia chronic disease: Patient started on Neupogen.  4. Essential hypertension: Continue hydralazine, lisinopril, nifedipine and Norvasc.  5. Elevated troponin: This is due to poor renal clearance and not ACS.  6.  Hyponatremia: This is improved  with dialysis and discontinuation of torsemide.   Management plans discussed with the patient and he is in agreement.  CODE STATUS: FULL  TOTAL TIME TAKING CARE OF THIS PATIENT: 30 minutes.     POSSIBLE D/C 2-3 days, DEPENDING ON CLINICAL CONDITION.   Norabelle Kondo M.D on 04/28/2016 at 1:15 PM  Between 7am to 6pm - Pager - [phone] After 6pm go to [email or web address] - password [email or web address]  [facility]  Office  [phone]  CC: Primary care physician; Lauro RegulusANDERSON,MARSHALL W., MD  Note: This dictation was prepared with Dragon dictation along with smaller phrase technology. Any transcriptional errors that result from this process are unintentional.

## 2016-04-28 NOTE — Consult Note (Signed)
[facility] Consult   Reason for Consult:  Consult for 82 year old man with end-stage renal disease now on dialysis. Concern about depression. Referring Physician:  Mody Patient Identification: Eric Yu MRN:  [id number] Principal Diagnosis: Severe major depression, single episode [facility]) Diagnosis:   Patient Active Problem List   Diagnosis Date Noted  . Severe major depression, single episode ([place]) [F32.2] 04/28/2016  . Acute renal failure (ARF) ([place]) [N17.9] 04/26/2016  . Injury of kidney [S37.009A] 01/08/2015  . Blood loss, postoperative [T88.8XXA] 01/06/2015  . Hypertensive left ventricular hypertrophy [I11.9] 12/26/2014  . Chronic kidney disease, stage IV (severe) ([place]) [N18.4] 12/12/2014  . Chronic kidney disease (CKD), stage III (moderate) [N18.3] 01/16/2014  . Gout [M10.9] 01/16/2014  . Benign hypertension [I10] 05/19/2012  . Current tobacco use [Z72.0] 05/19/2012    Total Time spent with patient: 1 hour  Subjective:   Eric Yu is a 82 y.o. male patient admitted with "I'm in pain".  HPI:  Patient interviewed. Chart reviewed. Information also from his sister and from the nursing staff. This 45 year old man was admitted to the hospital with worsening weakness and worsening renal function and is now on dialysis. History of hypertension history of gout. Patient is complaining of pain all over especially in his chest. Appetite is poor. Sleep is poor and frequently interrupted. He states that his mood is very bad. He has little optimism. Patient stated that he did have suicidal thoughts. Collateral information from nursing is that he has several times said that he would like to shoot himself and that he does in fact own a firearm at home. Patient tells me that he has had some visual hallucinations today of things moving around the room but he doesn't make very much of it. In any other psychotic symptoms. He denies any history of alcohol or drug abuse. He is not currently  on any psychiatric medicine for depression.  Social history: Lives by himself. No children. Sister was present and seems to be probably his closest relative. Retired. Seems to have not much going on at home.  Medical history: Long-standing end-stage renal disease but finally now needing to go on dialysis. History of hypertension and gout now some complications after surgery with some blood loss.  Substance abuse history: Denies any alcohol or drug abuse anytime recently denies any long-standing alcohol or drug problems  Past Psychiatric History: Evidently no prior psychiatric history. No psychiatric hospitalization. No reports of any prior suicide attempts. Doesn't appear to of ever been on psychiatric medicine for depression.  Risk to Self: Is patient at risk for suicide?: Yes Risk to Others:   Prior Inpatient Therapy:   Prior Outpatient Therapy:    Past Medical History:  Past Medical History:  Diagnosis Date  . Chronic kidney disease   . Gout   . Hypertension   . Iron deficiency     Past Surgical History:  Procedure Laterality Date  . NO PAST SURGERIES    . PERIPHERAL VASCULAR CATHETERIZATION N/A 04/26/2016   Procedure: Dialysis/Perma Catheter Insertion;  Surgeon: Algernon Huxley, MD;  Location: [facility];  Service: Cardiovascular;  Laterality: N/A;  . THYROIDECTOMY     Family History:  Family History  Problem Relation Age of Onset  . Cancer Sister   . Hypertension Sister   . Urolithiasis Neg Hx   . Prostate cancer Neg Hx   . Kidney disease Neg Hx   . Kidney cancer Neg Hx    Family Psychiatric  History: Family history is  noncontributory. Patient does not know of any family history of mental health problems Social History:  History  Alcohol Use No     History  Drug Use No    Social History   Social History  . Marital status: Widowed    Spouse name: N/A  . Number of children: N/A  . Years of education: N/A   Social History Main Topics  . Smoking status:  Former Smoker    Quit date: 04/16/2016  . Smokeless tobacco: Never Used     Comment: pt is currently using nicotine patches  . Alcohol use No  . Drug use: No  . Sexual activity: Not Currently   Other Topics Concern  . None   Social History Narrative  . None   Additional Social History:    Allergies:  No Known Allergies  Labs:  Results for orders placed or performed during the hospital encounter of 04/26/16 (from the past 48 hour(s))  Basic metabolic panel     Status: Abnormal   Collection Time: 04/27/16  4:50 AM  Result Value Ref Range   Sodium 136 135 - 145 mmol/L   Potassium 4.8 3.5 - 5.1 mmol/L   Chloride 101 101 - 111 mmol/L   CO2 23 22 - 32 mmol/L   Glucose, Bld 86 65 - 99 mg/dL   BUN 87 (H) 6 - 20 mg/dL   Creatinine, Ser 7.62 (H) 0.61 - 1.24 mg/dL   Calcium 8.6 (L) 8.9 - 10.3 mg/dL   GFR calc non Af Amer 6 (L) >60 mL/min   GFR calc Af Amer 7 (L) >60 mL/min    Comment: (NOTE) The eGFR has been calculated using the CKD EPI equation. This calculation has not been validated in all clinical situations. eGFR's persistently <60 mL/min signify possible Chronic Kidney Disease.    Anion gap 12 5 - 15    Current Facility-Administered Medications  Medication Dose Route Frequency Provider Last Rate Last Dose  . 0.9 %  sodium chloride infusion  250 mL Intravenous PRN Bettey Costa, MD      . acetaminophen (TYLENOL) tablet 650 mg  650 mg Oral Q6H PRN Bettey Costa, MD       Or  . acetaminophen (TYLENOL) suppository 650 mg  650 mg Rectal Q6H PRN Sital Mody, MD      . albuterol (PROVENTIL) (2.5 MG/3ML) 0.083% nebulizer solution 2.5 mg  2.5 mg Inhalation Daily Bettey Costa, MD   2.5 mg at 04/27/16 0725  . allopurinol (ZYLOPRIM) tablet 100 mg  100 mg Oral Daily Bettey Costa, MD   100 mg at 04/28/16 1839  . amLODipine (NORVASC) tablet 10 mg  10 mg Oral Daily Bettey Costa, MD   10 mg at 04/28/16 1837  . aspirin EC tablet 81 mg  81 mg Oral Daily Bettey Costa, MD   81 mg at 04/28/16 1839  .  cholecalciferol (VITAMIN D) tablet 1,000 Units  1,000 Units Oral Daily Bettey Costa, MD   1,000 Units at 04/28/16 1838  . cloNIDine (CATAPRES) tablet 0.2 mg  0.2 mg Oral BID Bettey Costa, MD   0.2 mg at 04/28/16 1837  . epoetin alfa (EPOGEN,PROCRIT) injection 4,000 Units  4,000 Units Intravenous Q M,W,F-HD Sital Mody, MD      . ferrous sulfate tablet 325 mg  325 mg Oral Daily Bettey Costa, MD   325 mg at 04/28/16 1838  . FLUoxetine (PROZAC) capsule 10 mg  10 mg Oral Daily Gonzella Lex, MD      .  heparin injection 5,000 Units  5,000 Units Subcutaneous Q8H Bettey Costa, MD   5,000 Units at 04/28/16 0610  . hydrALAZINE (APRESOLINE) tablet 100 mg  100 mg Oral TID Bettey Costa, MD   100 mg at 04/27/16 2111  . lisinopril (PRINIVIL,ZESTRIL) tablet 20 mg  20 mg Oral QHS Vaughan Basta, MD      . nicotine (NICODERM CQ - dosed in mg/24 hours) patch 21 mg  21 mg Transdermal Daily Gladstone Lighter, MD   21 mg at 04/28/16 1847  . NIFEdipine (PROCARDIA-XL/ADALAT CC) 24 hr tablet 30 mg  30 mg Oral Daily Bettey Costa, MD   30 mg at 04/28/16 1836  . ondansetron (ZOFRAN) tablet 4 mg  4 mg Oral Q6H PRN Bettey Costa, MD       Or  . ondansetron (ZOFRAN) injection 4 mg  4 mg Intravenous Q6H PRN Bettey Costa, MD   4 mg at 04/27/16 2022  . senna-docusate (Senokot-S) tablet 1 tablet  1 tablet Oral QHS PRN Bettey Costa, MD      . sodium chloride flush (NS) 0.9 % injection 3 mL  3 mL Intravenous Q12H Bettey Costa, MD   3 mL at 04/27/16 2110  . sodium chloride flush (NS) 0.9 % injection 3 mL  3 mL Intravenous PRN Bettey Costa, MD        Musculoskeletal: Strength & Muscle Tone: decreased Gait & Station: unable to stand Patient leans: N/A  Psychiatric Specialty Exam: Physical Exam  Nursing note and vitals reviewed. Constitutional: He appears well-developed. He appears cachectic.  HENT:  Head: Normocephalic and atraumatic.  Eyes: Conjunctivae are normal. Pupils are equal, round, and reactive to light.  Neck: Normal range of  motion.  Cardiovascular: Normal heart sounds.   Respiratory: He is in respiratory distress.  GI: Soft.  Musculoskeletal: Normal range of motion.  Neurological: He is alert.  Skin: Skin is warm and dry.  Psychiatric: His speech is delayed. He is slowed. Cognition and memory are normal. He expresses impulsivity. He exhibits a depressed mood. He expresses suicidal ideation. He expresses suicidal plans.    Review of Systems  Constitutional: Positive for malaise/fatigue and weight loss.  HENT: Negative.   Eyes: Negative.   Respiratory: Negative.   Cardiovascular: Positive for chest pain.  Gastrointestinal: Positive for nausea.  Musculoskeletal: Positive for myalgias.  Skin: Negative.   Neurological: Positive for weakness.  Psychiatric/Behavioral: Positive for depression, hallucinations and suicidal ideas. Negative for memory loss and substance abuse. The patient is nervous/anxious and has insomnia.     Blood pressure (!) 163/83, pulse 97, temperature 98.1 F (36.7 C), temperature source Oral, resp. rate 16, height 5' 8"  (1.727 m), weight 68.7 kg (151 lb 7.3 oz), SpO2 97 %.Body mass index is 23.03 kg/m.  General Appearance: Disheveled  Eye Contact:  Minimal  Speech:  Slow and Slurred  Volume:  Decreased  Mood:  Depressed, Dysphoric and Hopeless  Affect:  Constricted and Depressed  Thought Process:  Coherent  Orientation:  Full (Time, Place, and Person)  Thought Content:  Logical  Suicidal Thoughts:  Yes.  with intent/plan  Homicidal Thoughts:  No  Memory:  Immediate;   Good Recent;   Fair Remote;   Fair  Judgement:  Impaired  Insight:  Shallow  Psychomotor Activity:  Decreased  Concentration:  Concentration: Fair  Recall:  [employer] of Knowledge:  Fair  Language:  Fair  Akathisia:  No  Handed:  Right  AIMS (if indicated):     Assets:  Housing  ADL's:  Impaired  Cognition:  WNL  Sleep:        Treatment Plan Summary: Daily contact with patient to assess and evaluate  symptoms and progress in treatment, Medication management and Plan This 82 year old man with multiple medical problems now on dialysis is reporting active suicidal ideation. Affect is flat and blunted. Speech is slow and slurred and difficult to understand but it was clear that he was saying that he was having active suicidal thoughts. Appears very depressed. Major stresses that he reports are his pain and going on to dialysis. Patient should continue having the suicide precautions sitter. I am putting in an order to start fluoxetine 10 mg a day for depression. I will follow up daily. We will continue to assess as his course proceeds. If he continues to have this degree of suicidal ideation and he certainly should not go home although in his current medical condition it would be difficult to find a psychiatric bed for him.  Disposition: Supportive therapy provided about ongoing stressors.  Alethia Berthold, MD 04/28/2016 7:41 PM

## 2016-04-28 NOTE — Progress Notes (Signed)
Hd start 

## 2016-04-28 NOTE — Progress Notes (Signed)
POST HD ASSESSMENT 

## 2016-04-28 NOTE — Progress Notes (Signed)
Pt had VTach on monitor. Asymptomatic. CCMD called. RN and sitter present with pt.

## 2016-04-28 NOTE — Plan of Care (Signed)
Problem: Safety: Goal: Ability to remain free from injury will improve Outcome: Progressing Patient has safety sitter present

## 2016-04-28 NOTE — Progress Notes (Signed)
Pt stated to one of the NTs this AM that he felt like he was dying. I went to his room to ask him how he was feeling and he stated "Im ready to die". I asked him was he thinking of hurting himself and he replied "yea, If I had a gun right now I would kill myself". MD notified and orders placed for 1:1 sitter and psych consult.

## 2016-04-28 NOTE — Progress Notes (Signed)
PRE HD ASSESSMENT 

## 2016-04-29 ENCOUNTER — Inpatient Hospital Stay: Payer: Medicare HMO

## 2016-04-29 LAB — BASIC METABOLIC PANEL
Anion gap: 9 (ref 5–15)
BUN: 23 mg/dL — ABNORMAL HIGH (ref 6–20)
CALCIUM: 8.4 mg/dL — AB (ref 8.9–10.3)
CHLORIDE: 98 mmol/L — AB (ref 101–111)
CO2: 30 mmol/L (ref 22–32)
CREATININE: 3.55 mg/dL — AB (ref 0.61–1.24)
GFR, EST AFRICAN AMERICAN: 17 mL/min — AB (ref >60)
GFR, EST NON AFRICAN AMERICAN: 15 mL/min — AB (ref >60)
Glucose, Bld: 78 mg/dL (ref 65–99)
Potassium: 3.6 mmol/L (ref 3.5–5.1)
SODIUM: 137 mmol/L (ref 135–145)

## 2016-04-29 LAB — MAGNESIUM: MAGNESIUM: 1.8 mg/dL (ref 1.7–2.4)

## 2016-04-29 NOTE — Care Management Important Message (Signed)
Important Message  Patient Details  Name: Eric Yu MRN: [id number] Date of Birth: 11-09-1933   Medicare Important Message Given:  Yes    Chapman FitchBOWEN, Tocarra Gassen T, RN 04/29/2016, 4:15 PM

## 2016-04-29 NOTE — Progress Notes (Signed)
MD notified of sinus pause on tele.

## 2016-04-29 NOTE — Progress Notes (Signed)
MD notified of 8 beat SVT run. Orders to draw BMP and Magnesium level this am. Pt is asymptomatic. VSS. Will continue to monitor.

## 2016-04-29 NOTE — Progress Notes (Signed)
Md Dr. Imogene Burnhen notified of sinus pause.

## 2016-04-29 NOTE — Progress Notes (Signed)
[employer] - [place] [facility]   PATIENT NAME: Eric Yu    MR#:  [id number]  DATE OF BIRTH:  Jan 01, 1934  SUBJECTIVE:   Patient's mood is better this am denies SI  REVIEW OF SYSTEMS:    Review of Systems  Constitutional: Negative.  Negative for chills, fever and malaise/fatigue.  HENT: Negative.  Negative for ear discharge, ear pain, hearing loss, nosebleeds and sore throat.   Eyes: Negative.  Negative for blurred vision and pain.  Respiratory: Negative.  Negative for cough, hemoptysis, shortness of breath and wheezing.   Cardiovascular: Negative.  Negative for chest pain, palpitations and leg swelling.  Gastrointestinal: Negative.  Negative for abdominal pain, blood in stool, diarrhea, nausea and vomiting.  Genitourinary: Negative.  Negative for dysuria.  Musculoskeletal: Negative.  Negative for back pain.  Skin: Negative.   Neurological: Negative for dizziness, tremors, speech change, focal weakness, seizures and headaches.  Endo/Heme/Allergies: Negative.  Does not bruise/bleed easily.  Psychiatric/Behavioral: Positive for depression (better). Negative for hallucinations and suicidal ideas.    Tolerating Diet:yes      DRUG ALLERGIES:  No Known Allergies  VITALS:  Blood pressure 140/70, pulse 84, temperature 98.4 F (36.9 C), temperature source Oral, resp. rate 20, height 5\' 8"  (1.727 m), weight 68.7 kg (151 lb 7.3 oz), SpO2 98 %.  PHYSICAL EXAMINATION:   Physical Exam  Constitutional: He is oriented to person, place, and time and well-developed, well-nourished, and in no distress. No distress.  HENT:  Head: Normocephalic.  Eyes: No scleral icterus.  Neck: Normal range of motion. Neck supple. No JVD present. No tracheal deviation present.  Cardiovascular: Normal rate and regular rhythm.  Exam reveals no gallop and no friction rub.   Murmur heard. Pulmonary/Chest: Effort normal and breath sounds normal. No respiratory distress. He has no  wheezes. He has no rales. He exhibits no tenderness.  Abdominal: Soft. Bowel sounds are normal. He exhibits no distension and no mass. There is no tenderness. There is no rebound and no guarding.  Musculoskeletal: Normal range of motion. He exhibits no edema.  Neurological: He is alert and oriented to person, place, and time.  Skin: Skin is warm. No rash noted. No erythema.  Psychiatric:  Affect improved      LABORATORY PANEL:   CBC  Recent Labs Lab 04/26/16 1917  WBC 7.0  HGB 9.8*  HCT 29.8*  PLT 235   ------------------------------------------------------------------------------------------------------------------  Chemistries   Recent Labs Lab 04/29/16 0513  NA 137  K 3.6  CL 98*  CO2 30  GLUCOSE 78  BUN 23*  CREATININE 3.55*  CALCIUM 8.4*  MG 1.8   ------------------------------------------------------------------------------------------------------------------  Cardiac Enzymes  Recent Labs Lab 04/26/16 0919  TROPONINI 0.09*   ------------------------------------------------------------------------------------------------------------------  RADIOLOGY:  Dg Chest 1 View  Result Date: 04/29/2016 CLINICAL DATA:  Congestion. EXAM: CHEST 1 VIEW COMPARISON:  04/26/2016 . FINDINGS: Right IJ sheath a cavoatrial junction. Cardiomegaly. No evidence of overt congestive heart failure. No focal infiltrate. Previously identified nodular density over the right mid lung is not identified on today's exam. Continued follow-up suggested. Small left pleural effusion cannot be excluded. No pneumothorax . IMPRESSION: 1. Right IJ sheath noted at the cavoatrial junction. 2.  Stable cardiomegaly. 3. Previously identified nodular density over the right mid lung is not identified on today's exam. Continued follow-up suggested. Electronically Signed   By: Maisie Fushomas  Register   On: 04/29/2016 07:18     ASSESSMENT AND PLAN:    82 year old male with a history of  chronic kidney disease  stage V now with end-stage renal [facility] and hypertension who presented for evaluation for ESRD.  1. ESRD: Patient has done well with initiation of dialysis. He has had 3 HD treatments. We are waiting for outpatient HD placement. I have called HD CM but no response. He was evaluated by Dr Cherly Hensenhang and as per her note, consider referral to [facility] to place access.  2. Depression with suicidal ideation: Patient is now currently denying suicidal ideation. His mood seems to have dramatically improved. He is about by psychiatry yesterday. He is started on Prozac. I will await for Dr Toni Amendlapacs to d/c sitter.  3. Anemia chronic disease: Patient started on Neupogen.  4. Essential hypertension: Continue hydralazine, lisinopril, nifedipine and Norvasc.  5. Elevated troponin: This is due to poor renal clearance and not ACS.  6. Hyponatremia: This is improved with dialysis and discontinuation of torsemide.   Management plans discussed with the patient and he is in agreement.  CODE STATUS: FULL  TOTAL TIME TAKING CARE OF THIS PATIENT: 25 minutes.     Patient ready for discharge once cleared by psychiatry and he has an outpatient for dialysis.  Ade Stmarie M.D on 04/29/2016 at 12:13 PM  Between 7am to 6pm - Pager - [phone] After 6pm go to [email or web address] - password [email or web address]  [facility]  Office  [phone]  CC: Primary care physician; Lauro RegulusANDERSON,MARSHALL W., MD  Note: This dictation was prepared with Dragon dictation along with smaller phrase technology. Any transcriptional errors that result from this process are unintentional.

## 2016-04-29 NOTE — Consult Note (Signed)
[place] [facility] Consult   Reason for Consult:  Consult for 82 year old man with end-stage renal disease now on dialysis. Concern about depression. Referring Physician:  Mody Patient Identification: Eric Yu MRN:  [id number] Principal Diagnosis: Severe major depression, single episode [facility]) Diagnosis:   Patient Active Problem List   Diagnosis Date Noted  . Severe major depression, single episode ([place]) [F32.2] 04/28/2016  . Acute renal failure (ARF) ([place]) [N17.9] 04/26/2016  . Injury of kidney [S37.009A] 01/08/2015  . Blood loss, postoperative [T88.8XXA] 01/06/2015  . Hypertensive left ventricular hypertrophy [I11.9] 12/26/2014  . Chronic kidney disease, stage IV (severe) ([place]) [N18.4] 12/12/2014  . Chronic kidney disease (CKD), stage III (moderate) [N18.3] 01/16/2014  . Gout [M10.9] 01/16/2014  . Benign hypertension [I10] 05/19/2012  . Current tobacco use [Z72.0] 05/19/2012    Total Time spent with patient: 20 minutes  Subjective:   Eric Yu is a 82 y.o. male patient admitted with "I'm in pain".  Follow-up for this 82 year old man seen yesterday because of depression. Patient states he is feeling much better today. His pain is under control. He has been able to eat during the day and is feeling stronger. Doesn't feel like he's having respiratory distress. He tells me that he is having no suicidal thoughts at all. I spoke with nursing as well who confirm that this is been his statement all day is that he no longer has any wish to die.  HPI:  Patient interviewed. Chart reviewed. Information also from his sister and from the nursing staff. This 31 year old man was admitted to the hospital with worsening weakness and worsening renal function and is now on dialysis. History of hypertension history of gout. Patient is complaining of pain all over especially in his chest. Appetite is poor. Sleep is poor and frequently interrupted. He states that his mood is very bad. He has little  optimism. Patient stated that he did have suicidal thoughts. Collateral information from nursing is that he has several times said that he would like to shoot himself and that he does in fact own a firearm at home. Patient tells me that he has had some visual hallucinations today of things moving around the room but he doesn't make very much of it. In any other psychotic symptoms. He denies any history of alcohol or drug abuse. He is not currently on any psychiatric medicine for depression.  Social history: Lives by himself. No children. Sister was present and seems to be probably his closest relative. Retired. Seems to have not much going on at home.  Medical history: Long-standing end-stage renal disease but finally now needing to go on dialysis. History of hypertension and gout now some complications after surgery with some blood loss.  Substance abuse history: Denies any alcohol or drug abuse anytime recently denies any long-standing alcohol or drug problems  Past Psychiatric History: Evidently no prior psychiatric history. No psychiatric hospitalization. No reports of any prior suicide attempts. Doesn't appear to of ever been on psychiatric medicine for depression.  Risk to Self: Is patient at risk for suicide?: Yes Risk to Others:   Prior Inpatient Therapy:   Prior Outpatient Therapy:    Past Medical History:  Past Medical History:  Diagnosis Date  . Chronic kidney disease   . Gout   . Hypertension   . Iron deficiency     Past Surgical History:  Procedure Laterality Date  . NO PAST SURGERIES    . PERIPHERAL VASCULAR CATHETERIZATION N/A 04/26/2016   Procedure: Dialysis/Perma Catheter Insertion;  Surgeon: Algernon Huxley, MD;  Location: [place] [facility];  Service: Cardiovascular;  Laterality: N/A;  . THYROIDECTOMY     Family History:  Family History  Problem Relation Age of Onset  . Cancer Sister   . Hypertension Sister   . Urolithiasis Neg Hx   . Prostate cancer Neg Hx   .  Kidney disease Neg Hx   . Kidney cancer Neg Hx    Family Psychiatric  History: Family history is noncontributory. Patient does not know of any family history of mental health problems Social History:  History  Alcohol Use No     History  Drug Use No    Social History   Social History  . Marital status: Widowed    Spouse name: N/A  . Number of children: N/A  . Years of education: N/A   Social History Main Topics  . Smoking status: Former Smoker    Quit date: 04/16/2016  . Smokeless tobacco: Never Used     Comment: pt is currently using nicotine patches  . Alcohol use No  . Drug use: No  . Sexual activity: Not Currently   Other Topics Concern  . None   Social History Narrative  . None   Additional Social History:    Allergies:  No Known Allergies  Labs:  Results for orders placed or performed during the hospital encounter of 04/26/16 (from the past 48 hour(s))  Basic metabolic panel     Status: Abnormal   Collection Time: 04/29/16  5:13 AM  Result Value Ref Range   Sodium 137 135 - 145 mmol/L   Potassium 3.6 3.5 - 5.1 mmol/L   Chloride 98 (L) 101 - 111 mmol/L   CO2 30 22 - 32 mmol/L   Glucose, Bld 78 65 - 99 mg/dL   BUN 23 (H) 6 - 20 mg/dL   Creatinine, Ser 3.55 (H) 0.61 - 1.24 mg/dL   Calcium 8.4 (L) 8.9 - 10.3 mg/dL   GFR calc non Af Amer 15 (L) >60 mL/min   GFR calc Af Amer 17 (L) >60 mL/min    Comment: (NOTE) The eGFR has been calculated using the CKD EPI equation. This calculation has not been validated in all clinical situations. eGFR's persistently <60 mL/min signify possible Chronic Kidney Disease.    Anion gap 9 5 - 15  Magnesium     Status: None   Collection Time: 04/29/16  5:13 AM  Result Value Ref Range   Magnesium 1.8 1.7 - 2.4 mg/dL    Current Facility-Administered Medications  Medication Dose Route Frequency Provider Last Rate Last Dose  . 0.9 %  sodium chloride infusion  250 mL Intravenous PRN Bettey Costa, MD      . acetaminophen  (TYLENOL) tablet 650 mg  650 mg Oral Q6H PRN Bettey Costa, MD       Or  . acetaminophen (TYLENOL) suppository 650 mg  650 mg Rectal Q6H PRN Sital Mody, MD      . albuterol (PROVENTIL) (2.5 MG/3ML) 0.083% nebulizer solution 2.5 mg  2.5 mg Inhalation Daily Bettey Costa, MD   2.5 mg at 04/29/16 0752  . allopurinol (ZYLOPRIM) tablet 100 mg  100 mg Oral Daily Bettey Costa, MD   100 mg at 04/29/16 0909  . amLODipine (NORVASC) tablet 10 mg  10 mg Oral Daily Bettey Costa, MD   10 mg at 04/29/16 0909  . aspirin EC tablet 81 mg  81 mg Oral Daily Bettey Costa, MD   81 mg at  04/29/16 0910  . cholecalciferol (VITAMIN D) tablet 1,000 Units  1,000 Units Oral Daily Bettey Costa, MD   1,000 Units at 04/29/16 0910  . cloNIDine (CATAPRES) tablet 0.2 mg  0.2 mg Oral BID Bettey Costa, MD   0.2 mg at 04/29/16 0910  . epoetin alfa (EPOGEN,PROCRIT) injection 4,000 Units  4,000 Units Intravenous Q M,W,F-HD Sital Mody, MD      . ferrous sulfate tablet 325 mg  325 mg Oral Daily Bettey Costa, MD   325 mg at 04/29/16 0909  . FLUoxetine (PROZAC) capsule 10 mg  10 mg Oral Daily Gonzella Lex, MD   10 mg at 04/29/16 0909  . heparin injection 5,000 Units  5,000 Units Subcutaneous Q8H Bettey Costa, MD   5,000 Units at 04/29/16 1642  . hydrALAZINE (APRESOLINE) tablet 100 mg  100 mg Oral TID Bettey Costa, MD   100 mg at 04/29/16 1815  . lisinopril (PRINIVIL,ZESTRIL) tablet 20 mg  20 mg Oral QHS Vaughan Basta, MD   20 mg at 04/28/16 2152  . nicotine (NICODERM CQ - dosed in mg/24 hours) patch 21 mg  21 mg Transdermal Daily Gladstone Lighter, MD   21 mg at 04/29/16 0908  . NIFEdipine (PROCARDIA-XL/ADALAT CC) 24 hr tablet 30 mg  30 mg Oral Daily Bettey Costa, MD   30 mg at 04/29/16 0910  . ondansetron (ZOFRAN) tablet 4 mg  4 mg Oral Q6H PRN Bettey Costa, MD       Or  . ondansetron (ZOFRAN) injection 4 mg  4 mg Intravenous Q6H PRN Bettey Costa, MD   4 mg at 04/27/16 2022  . senna-docusate (Senokot-S) tablet 1 tablet  1 tablet Oral QHS PRN Bettey Costa, MD       . sodium chloride flush (NS) 0.9 % injection 3 mL  3 mL Intravenous Q12H Bettey Costa, MD   3 mL at 04/29/16 0911  . sodium chloride flush (NS) 0.9 % injection 3 mL  3 mL Intravenous PRN Bettey Costa, MD        Musculoskeletal: Strength & Muscle Tone: decreased Gait & Station: unable to stand Patient leans: N/A  Psychiatric Specialty Exam: Physical Exam  Nursing note and vitals reviewed. Constitutional: He appears well-developed. He appears cachectic.  HENT:  Head: Normocephalic and atraumatic.  Eyes: Conjunctivae are normal. Pupils are equal, round, and reactive to light.  Neck: Normal range of motion.  Cardiovascular: Normal heart sounds.   Respiratory: No respiratory distress.  GI: Soft.  Musculoskeletal: Normal range of motion.  Neurological: He is alert.  Skin: Skin is warm and dry.  Psychiatric: Thought content normal. His speech is delayed. He is slowed. Cognition and memory are normal. He does not express impulsivity. He does not exhibit a depressed mood. He expresses no suicidal ideation.    Review of Systems  Constitutional: Positive for malaise/fatigue and weight loss.  HENT: Negative.   Eyes: Negative.   Respiratory: Negative.   Cardiovascular: Positive for chest pain.  Gastrointestinal: Positive for nausea.  Musculoskeletal: Positive for myalgias.  Skin: Negative.   Neurological: Positive for weakness.  Psychiatric/Behavioral: Positive for depression, hallucinations and suicidal ideas. Negative for memory loss and substance abuse. The patient is nervous/anxious and has insomnia.     Blood pressure (!) 141/72, pulse 72, temperature 98.6 F (37 C), temperature source Oral, resp. rate 18, height 5' 8"  (1.727 m), weight 68.7 kg (151 lb 7.3 oz), SpO2 100 %.Body mass index is 23.03 kg/m.  General Appearance: Casual  Eye Contact:  Good  Speech:  Clear and Coherent  Volume:  Decreased  Mood:  Euthymic  Affect:  Appropriate  Thought Process:  Coherent   Orientation:  Full (Time, Place, and Person)  Thought Content:  Logical  Suicidal Thoughts:  No  Homicidal Thoughts:  No  Memory:  Immediate;   Good Recent;   Fair Remote;   Fair  Judgement:  Fair  Insight:  Present  Psychomotor Activity:  Decreased  Concentration:  Concentration: Fair  Recall:  [employer] of Knowledge:  Fair  Language:  Fair  Akathisia:  No  Handed:  Right  AIMS (if indicated):     Assets:  Housing  ADL's:  Impaired  Cognition:  WNL  Sleep:        Treatment Plan Summary: Daily contact with patient to assess and evaluate symptoms and progress in treatment, Medication management and Plan Patient seen for follow-up. He is remarkably better. He says he is no longer in pain so he no longer has suicidal thoughts. Apparently he has been in good spirits and calm throughout the day. Energy is better. I confirmed with nursing that this is been his presentation all day. I'm not yet discontinuing suicide precautions although we can probably do that tomorrow. Still feeling well. Supportive counseling no change to medicine.  Disposition: Supportive therapy provided about ongoing stressors.  Alethia Berthold, MD 04/29/2016 6:39 PM

## 2016-04-29 NOTE — Treatment Plan (Signed)
Outpatient Nephrology visit:  Reason for visit Came by to see [name] this AM after alerted by inpatient nephrology service that he was severely depressed with SI and not wanting to continue with dialysis. He was in pain and concerned about the burden he was placing on others.  Assessment: [name] states he is feeling better today. He came in initially because he felt very bad with nausea and pain. Tunneled catheter was placed, and he has undergone 3 treatments. Nausea is resolved. Yesterday he was complaining of significant pain. Due to this, he was expressing SI. He was seen by psychiatry who initiated fluoxetine and placed with a sitter.  Today he is brighter and denies SI. He acknowledges that he was in pain and despondent yesterday but says today he is better and is discussing outpatient dialysis plans with me. He knows he will go to [facility] [address] for dialysis.  Recommendations: Appreciate care by Dr. Cherylann RatelLateef. Have notified our NP, Kennith GainLucy Wessinger, that he will soon be ready to start at [facility] [address] dialysis. Currently dialyzing through [facility]. Due to the expressed burden of [facility], we will consider referral to [facility] to place access.  I will communicate this to [facility].

## 2016-04-30 MED ORDER — FLUOXETINE HCL 10 MG PO CAPS: 10.0000 mg | ORAL_CAPSULE | Freq: Every day | ORAL | 0 refills | Status: DC

## 2016-04-30 NOTE — Consult Note (Signed)
[place] Psychiat[facility] Consult   Reason for Consult:  Consult for 82 year old man with end-stage renal disease now on dialysis. Concern about depression. Referring Physician:  Mody Patient Identification: Eric Yu MRN:  [id number] Principal Diagnosis: Severe major depression, single episode [facility]) Diagnosis:   Patient Active Problem List   Diagnosis Date Noted  . Severe major depression, single episode ([place]) [F32.2] 04/28/2016  . Acute renal failure (ARF) ([place]) [N17.9] 04/26/2016  . Injury of kidney [S37.009A] 01/08/2015  . Blood loss, postoperative [T88.8XXA] 01/06/2015  . Hypertensive left ventricular hypertrophy [I11.9] 12/26/2014  . Chronic kidney disease, stage IV (severe) ([place]) [N18.4] 12/12/2014  . Chronic kidney disease (CKD), stage III (moderate) [N18.3] 01/16/2014  . Gout [M10.9] 01/16/2014  . Benign hypertension [I10] 05/19/2012  . Current tobacco use [Z72.0] 05/19/2012    Total Time spent with patient: 20 minutes  Subjective:   Eric Yu is a 82 y.o. male patient admitted with "I'm in pain".  Follow-up for this patient. Friday the 18th. Patient continues to say he is feeling better. He denies having any thoughts about wanting to die. He says he is having less pain. Feels more optimistic in general about discharge. Patient has not had any acting out behavior or shown any aggression. Tolerating medicine adequately.  HPI:  Patient interviewed. Chart reviewed. Information also from his sister and from the nursing staff. This 63 year old man was admitted to the hospital with worsening weakness and worsening renal function and is now on dialysis. History of hypertension history of gout. Patient is complaining of pain all over especially in his chest. Appetite is poor. Sleep is poor and frequently interrupted. He states that his mood is very bad. He has little optimism. Patient stated that he did have suicidal thoughts. Collateral information from nursing is that he has  several times said that he would like to shoot himself and that he does in fact own a firearm at home. Patient tells me that he has had some visual hallucinations today of things moving around the room but he doesn't make very much of it. In any other psychotic symptoms. He denies any history of alcohol or drug abuse. He is not currently on any psychiatric medicine for depression.  Social history: Lives by himself. No children. Sister was present and seems to be probably his closest relative. Retired. Seems to have not much going on at home.  Medical history: Long-standing end-stage renal disease but finally now needing to go on dialysis. History of hypertension and gout now some complications after surgery with some blood loss.  Substance abuse history: Denies any alcohol or drug abuse anytime recently denies any long-standing alcohol or drug problems  Past Psychiatric History: Evidently no prior psychiatric history. No psychiatric hospitalization. No reports of any prior suicide attempts. Doesn't appear to of ever been on psychiatric medicine for depression.  Risk to Self: Is patient at risk for suicide?: Yes Risk to Others:   Prior Inpatient Therapy:   Prior Outpatient Therapy:    Past Medical History:  Past Medical History:  Diagnosis Date  . Chronic kidney disease   . Gout   . Hypertension   . Iron deficiency     Past Surgical History:  Procedure Laterality Date  . NO PAST SURGERIES    . PERIPHERAL VASCULAR CATHETERIZATION N/A 04/26/2016   Procedure: Dialysis/Perma Catheter Insertion;  Surgeon: Algernon Huxley, MD;  Location: [place] [facility];  Service: Cardiovascular;  Laterality: N/A;  . THYROIDECTOMY     Family History:  Family History  Problem Relation Age of Onset  . Cancer Sister   . Hypertension Sister   . Urolithiasis Neg Hx   . Prostate cancer Neg Hx   . Kidney disease Neg Hx   . Kidney cancer Neg Hx    Family Psychiatric  History: Family history is  noncontributory. Patient does not know of any family history of mental health problems Social History:  History  Alcohol Use No     History  Drug Use No    Social History   Social History  . Marital status: Widowed    Spouse name: N/A  . Number of children: N/A  . Years of education: N/A   Social History Main Topics  . Smoking status: Former Smoker    Quit date: 04/16/2016  . Smokeless tobacco: Never Used     Comment: pt is currently using nicotine patches  . Alcohol use No  . Drug use: No  . Sexual activity: Not Currently   Other Topics Concern  . None   Social History Narrative  . None   Additional Social History:    Allergies:  No Known Allergies  Labs:  Results for orders placed or performed during the hospital encounter of 04/26/16 (from the past 48 hour(s))  Basic metabolic panel     Status: Abnormal   Collection Time: 04/29/16  5:13 AM  Result Value Ref Range   Sodium 137 135 - 145 mmol/L   Potassium 3.6 3.5 - 5.1 mmol/L   Chloride 98 (L) 101 - 111 mmol/L   CO2 30 22 - 32 mmol/L   Glucose, Bld 78 65 - 99 mg/dL   BUN 23 (H) 6 - 20 mg/dL   Creatinine, Ser 3.55 (H) 0.61 - 1.24 mg/dL   Calcium 8.4 (L) 8.9 - 10.3 mg/dL   GFR calc non Af Amer 15 (L) >60 mL/min   GFR calc Af Amer 17 (L) >60 mL/min    Comment: (NOTE) The eGFR has been calculated using the CKD EPI equation. This calculation has not been validated in all clinical situations. eGFR's persistently <60 mL/min signify possible Chronic Kidney Disease.    Anion gap 9 5 - 15  Magnesium     Status: None   Collection Time: 04/29/16  5:13 AM  Result Value Ref Range   Magnesium 1.8 1.7 - 2.4 mg/dL    Current Facility-Administered Medications  Medication Dose Route Frequency Provider Last Rate Last Dose  . 0.9 %  sodium chloride infusion  250 mL Intravenous PRN Bettey Costa, MD      . acetaminophen (TYLENOL) tablet 650 mg  650 mg Oral Q6H PRN Bettey Costa, MD       Or  . acetaminophen (TYLENOL)  suppository 650 mg  650 mg Rectal Q6H PRN Sital Mody, MD      . albuterol (PROVENTIL) (2.5 MG/3ML) 0.083% nebulizer solution 2.5 mg  2.5 mg Inhalation Daily Bettey Costa, MD   2.5 mg at 04/30/16 0736  . allopurinol (ZYLOPRIM) tablet 100 mg  100 mg Oral Daily Bettey Costa, MD   Stopped at 04/30/16 1000  . amLODipine (NORVASC) tablet 10 mg  10 mg Oral Daily Bettey Costa, MD   Stopped at 04/30/16 1000  . aspirin EC tablet 81 mg  81 mg Oral Daily Bettey Costa, MD   Stopped at 04/30/16 1000  . cholecalciferol (VITAMIN D) tablet 1,000 Units  1,000 Units Oral Daily Bettey Costa, MD   Stopped at 04/30/16 1000  . cloNIDine (CATAPRES) tablet 0.2  mg  0.2 mg Oral BID Bettey Costa, MD   Stopped at 04/30/16 1000  . epoetin alfa (EPOGEN,PROCRIT) injection 4,000 Units  4,000 Units Intravenous Q M,W,F-HD Bettey Costa, MD   4,000 Units at 04/30/16 1003  . ferrous sulfate tablet 325 mg  325 mg Oral Daily Bettey Costa, MD   Stopped at 04/30/16 1000  . FLUoxetine (PROZAC) capsule 10 mg  10 mg Oral Daily Gonzella Lex, MD   Stopped at 04/30/16 1000  . heparin injection 5,000 Units  5,000 Units Subcutaneous Q8H Bettey Costa, MD   5,000 Units at 04/30/16 1514  . hydrALAZINE (APRESOLINE) tablet 100 mg  100 mg Oral TID Bettey Costa, MD   100 mg at 04/30/16 1515  . lisinopril (PRINIVIL,ZESTRIL) tablet 20 mg  20 mg Oral QHS Vaughan Basta, MD   20 mg at 04/29/16 2111  . nicotine (NICODERM CQ - dosed in mg/24 hours) patch 21 mg  21 mg Transdermal Daily Gladstone Lighter, MD   Stopped at 04/30/16 1000  . NIFEdipine (PROCARDIA-XL/ADALAT CC) 24 hr tablet 30 mg  30 mg Oral Daily Bettey Costa, MD   Stopped at 04/30/16 1000  . ondansetron (ZOFRAN) tablet 4 mg  4 mg Oral Q6H PRN Bettey Costa, MD       Or  . ondansetron (ZOFRAN) injection 4 mg  4 mg Intravenous Q6H PRN Bettey Costa, MD   4 mg at 04/27/16 2022  . senna-docusate (Senokot-S) tablet 1 tablet  1 tablet Oral QHS PRN Bettey Costa, MD      . sodium chloride flush (NS) 0.9 % injection 3 mL  3 mL  Intravenous Q12H Bettey Costa, MD   Stopped at 04/30/16 1000  . sodium chloride flush (NS) 0.9 % injection 3 mL  3 mL Intravenous PRN Bettey Costa, MD        Musculoskeletal: Strength & Muscle Tone: decreased Gait & Station: unable to stand Patient leans: N/A  Psychiatric Specialty Exam: Physical Exam  Nursing note and vitals reviewed. Constitutional: He appears well-developed. He appears cachectic.  HENT:  Head: Normocephalic and atraumatic.  Eyes: Conjunctivae are normal. Pupils are equal, round, and reactive to light.  Neck: Normal range of motion.  Cardiovascular: Normal heart sounds.   Respiratory: No respiratory distress.  GI: Soft.  Musculoskeletal: Normal range of motion.  Neurological: He is alert.  Skin: Skin is warm and dry.  Psychiatric: Thought content normal. His speech is delayed. He is slowed. Cognition and memory are normal. He does not express impulsivity. He does not exhibit a depressed mood. He expresses no suicidal ideation.    Review of Systems  Constitutional: Positive for malaise/fatigue and weight loss.  HENT: Negative.   Eyes: Negative.   Respiratory: Negative.   Cardiovascular: Positive for chest pain.  Gastrointestinal: Positive for nausea.  Musculoskeletal: Positive for myalgias.  Skin: Negative.   Neurological: Positive for weakness.  Psychiatric/Behavioral: Positive for depression, hallucinations and suicidal ideas. Negative for memory loss and substance abuse. The patient is nervous/anxious and has insomnia.     Blood pressure (!) 158/69, pulse (!) 107, temperature 98.9 F (37.2 C), temperature source Oral, resp. rate 18, height _0  (1.727 m), weight 70 kg (154 lb 5.2 oz), SpO2 97 %.Body mass index is 23.46 kg/m.  General Appearance: Casual  Eye Contact:  Good  Speech:  Clear and Coherent  Volume:  Decreased  Mood:  Euthymic  Affect:  Appropriate  Thought Process:  Coherent  Orientation:  Full (Time, Place, and Person)  Thought  Content:   Logical  Suicidal Thoughts:  No  Homicidal Thoughts:  No  Memory:  Immediate;   Good Recent;   Fair Remote;   Fair  Judgement:  Fair  Insight:  Present  Psychomotor Activity:  Decreased  Concentration:  Concentration: Fair  Recall:  [employer] of Knowledge:  Fair  Language:  Fair  Akathisia:  No  Handed:  Right  AIMS (if indicated):     Assets:  Housing  ADL's:  Impaired  Cognition:  WNL  Sleep:        Treatment Plan Summary: Daily contact with patient to assess and evaluate symptoms and progress in treatment, Medication management and Plan Patient will be taken off of suicide precautions. No change to medicine. Reviewed with nursing.   Disposition: Supportive therapy provided about ongoing stressors.  Alethia Berthold, MD 04/30/2016 5:41 PM

## 2016-04-30 NOTE — Progress Notes (Signed)
Patient is alert and oriented, withdrawn affect, took over care after patient returned from dialysis, family at bedside, on room air, resting in between care, good appetite, sitter at bedside, uneventful shift.

## 2016-04-30 NOTE — Progress Notes (Signed)
POST HD VITALS 

## 2016-04-30 NOTE — Care Management (Signed)
Patient lives at home alone.  Admitted with ESRD.  Patient states that his sister lives 7 miles away and is available for support.  Sister provides transportation.  Patient states that he uses a walker and cane at home for ambulation.    Awaiting outpatient dialysis arrangements.  Patient has requested TTS 2nd shift at [facility] on [address].  I have spoke with the Central admissions coordinator this morning.  She stated that it could be as early has this afternoon that we receive placement.  However it may be tomorrow.  I spoke with Lattie CornsJeannette at [phone], who is to contact me when seat is confirmed.    Patient states that his sister is to provided transportation to outpatient dialysis.  I spoke with his sister Kara Meadmma [phone], who states that she is not sure she will be able to commit to transporting.  She states that a niece will be moving in with the patient to assist with care the 1st week of September.  I informed Kara Meadmma that she will need to provide transportation in the meantime unless she would like to use a service like [facility].  Information on [facility] provided.   Patient and family needs to be updated when outpatient seat is confirmed.

## 2016-04-30 NOTE — Progress Notes (Signed)
[name]  ROUNDING NOTE   Subjective:  Patient seen and evaluated during hemodialysis. He appears to be tolerating well. States he is feeling better as compared to admission. Still appears depressed however.  Objective:  Vital signs in last 24 hours:  Temp:  [98.6 F (37 C)-99.4 F (37.4 C)] 99.4 F (37.4 C) (08/18 0915) Pulse Rate:  [71-87] 86 (08/18 0930) Resp:  [14-19] 19 (08/18 0930) BP: (137-145)/(60-72) 142/68 (08/18 0930) SpO2:  [96 %-100 %] 100 % (08/18 0930) Weight:  [71.2 kg (156 lb 15.5 oz)] 71.2 kg (156 lb 15.5 oz) (08/18 0915)  Weight change:  Filed Weights   04/28/16 1150 04/28/16 1528 04/30/16 0915  Weight: 69.6 kg (153 lb 7 oz) 68.7 kg (151 lb 7.3 oz) 71.2 kg (156 lb 15.5 oz)    Intake/Output: I/O last 3 completed shifts: In: 720 [P.O.:720] Out: 250 [Urine:250]   Intake/Output this shift:  No intake/output data recorded.  Physical Exam: General: No acute distress  Head: Normocephalic, atraumatic. Moist oral mucosal membranes  Eyes: Anicteric  Neck: Supple, trachea midline  Lungs:  Clear to auscultation, normal effort  Heart: S1S2 no rubs  Abdomen:  Soft, nontender,   Extremities: No peripheral edema.  Neurologic: Nonfocal, moving all four extremities, depressed mood  Skin: No lesions  Access: R IJ permcath placed 04/26/16    Basic Metabolic Panel:  Recent Labs Lab 04/26/16 0919 04/26/16 1917 04/27/16 0450 04/29/16 0513  NA 130* 134* 136 137  K 5.2* 4.8 4.8 3.6  CL 96* 100* 101 98*  CO2 19* 21* 23 30  GLUCOSE 102* 83 86 78  BUN 118* 82* 87* 23*  CREATININE 9.43* 7.45*  7.45* 7.62* 3.55*  CALCIUM 8.9 8.9 8.6* 8.4*  MG  --   --   --  1.8  PHOS  --  5.4*  --   --     Liver Function Tests:  Recent Labs Lab 04/26/16 1917  ALBUMIN 3.4*   No results for input(s): LIPASE, AMYLASE in the last 168 hours. No results for input(s): AMMONIA in the last 168 hours.  CBC:  Recent Labs Lab 04/26/16 0919 04/26/16 1917   WBC 4.6 7.0  HGB 10.0* 9.8*  HCT 30.1* 29.8*  MCV 84.0 84.3  PLT 239 235    Cardiac Enzymes:  Recent Labs Lab 04/26/16 0919  TROPONINI 0.09*    BNP: Invalid input(s): POCBNP  CBG:  Recent Labs Lab 04/26/16 0948  GLUCAP 99    Microbiology: Results for orders placed or performed during the hospital encounter of 04/26/16  MRSA PCR Screening     Status: None   Collection Time: 04/26/16  7:05 PM  Result Value Ref Range Status   MRSA by PCR NEGATIVE NEGATIVE Final    Comment:        The GeneXpert MRSA Assay (FDA approved for NASAL specimens only), is one component of a comprehensive MRSA colonization surveillance program. It is not intended to diagnose MRSA infection nor to guide or monitor treatment for MRSA infections.     Coagulation Studies: No results for input(s): LABPROT, INR in the last 72 hours.  Urinalysis: No results for input(s): COLORURINE, LABSPEC, PHURINE, GLUCOSEU, HGBUR, BILIRUBINUR, KETONESUR, PROTEINUR, UROBILINOGEN, NITRITE, LEUKOCYTESUR in the last 72 hours.  Invalid input(s): APPERANCEUR    Imaging: Dg Chest 1 View  Addendum Date: 04/29/2016   ADDENDUM REPORT: 04/29/2016 16:10 ADDENDUM: The question has been raised regarding the presence of changes suggesting active TB. No evidence of active TB noted. Again  follow-up chest x-ray is suggested however to ensure the absence of previously identified right lung pulmonary nodule . A PA and lateral chest x-ray should be obtained. Electronically Signed   By: Maisie Fushomas  Register   On: 04/29/2016 16:10   Result Date: 04/29/2016 CLINICAL DATA:  Congestion. EXAM: CHEST 1 VIEW COMPARISON:  04/26/2016 . FINDINGS: Right IJ sheath a cavoatrial junction. Cardiomegaly. No evidence of overt congestive heart failure. No focal infiltrate. Previously identified nodular density over the right mid lung is not identified on today's exam. Continued follow-up suggested. Small left pleural effusion cannot be excluded. No  pneumothorax . IMPRESSION: 1. Right IJ sheath noted at the cavoatrial junction. 2.  Stable cardiomegaly. 3. Previously identified nodular density over the right mid lung is not identified on today's exam. Continued follow-up suggested. Electronically Signed: ByMaisie Fus: Thomas  Register On: 04/29/2016 07:18     Medications:     . albuterol  2.5 mg Inhalation Daily  . allopurinol  100 mg Oral Daily  . amLODipine  10 mg Oral Daily  . aspirin EC  81 mg Oral Daily  . cholecalciferol  1,000 Units Oral Daily  . cloNIDine  0.2 mg Oral BID  . epoetin (EPOGEN/PROCRIT) injection  4,000 Units Intravenous Q M,W,F-HD  . ferrous sulfate  325 mg Oral Daily  . FLUoxetine  10 mg Oral Daily  . heparin  5,000 Units Subcutaneous Q8H  . hydrALAZINE  100 mg Oral TID  . lisinopril  20 mg Oral QHS  . nicotine  21 mg Transdermal Daily  . NIFEdipine  30 mg Oral Daily  . sodium chloride flush  3 mL Intravenous Q12H   sodium chloride, acetaminophen **OR** acetaminophen, ondansetron **OR** ondansetron (ZOFRAN) IV, senna-docusate, sodium chloride flush  Assessment/ Plan:  82 y.o. male with a PMHx of Hypertension, gout, chronic kidney disease stage V, anemia of chronic kidney disease, secondary hyperparathyroidism, metabolic acidosis who was admitted to [facility] on 04/26/2016 for evaluation of end-stage renal disease.   1. End-stage renal disease [facility]. It appears that the patient has approached end-stage renal disease. Dr. Cherly Hensenhang of [facility] has been attempting to place a tunneled dialysis catheter, however the patient's family had some difficulty in getting the patient [facility].  Had suicidal ideation shortly after starting dialysis. - Patient seen and evaluated during fourth dialysis treatment. He appears to be tolerating well.  Outpatient dialysis planning on going. He will be placed at [address] [facility].  2. Anemia of chronic kidney disease. Continue Epogen  4000 units IV with dialysis.  3. Secondary hyperparathyroidism.  Phosphorus has come down with dialysis and is currently 5.4. Continue to monitor.  4. Metabolic acidosis. Resolved with dialysis. Continue to monitor serum bicarbonate.  5. Hypertension.  Blood pressure 142/68. Continue hydralazine, lisinopril, nifedipine, and amlodipine.   LOS: 4 Autumne Kallio 8/18/20179:50 AM

## 2016-04-30 NOTE — Progress Notes (Signed)
Pre hd assessment  

## 2016-04-30 NOTE — Progress Notes (Signed)
[employer] [facility]   PATIENT NAME: Eric HalsJohn Yu    MR#:  [id number]  DATE OF BIRTH:  May 11, 1934  SUBJECTIVE:  Seen downstairs Mood improved  REVIEW OF SYSTEMS:    Review of Systems  Constitutional: Negative.  Negative for chills, fever and malaise/fatigue.  HENT: Negative.  Negative for ear discharge, ear pain, hearing loss, nosebleeds and sore throat.   Eyes: Negative.  Negative for blurred vision and pain.  Respiratory: Negative.  Negative for cough, hemoptysis, shortness of breath and wheezing.   Cardiovascular: Negative.  Negative for chest pain, palpitations and leg swelling.  Gastrointestinal: Negative.  Negative for abdominal pain, blood in stool, diarrhea, nausea and vomiting.  Genitourinary: Negative.  Negative for dysuria.  Musculoskeletal: Negative.  Negative for back pain.  Skin: Negative.   Neurological: Negative for dizziness, tremors, speech change, focal weakness, seizures and headaches.  Endo/Heme/Allergies: Negative.  Does not bruise/bleed easily.  Psychiatric/Behavioral: Positive for depression (better). Negative for hallucinations and suicidal ideas.    Tolerating Diet:yes      DRUG ALLERGIES:  No Known Allergies  VITALS:  Blood pressure (!) 144/79, pulse 82, temperature 99.4 F (37.4 C), temperature source Oral, resp. rate 16, height 5\' 8"  (1.727 m), weight 71.2 kg (156 lb 15.5 oz), SpO2 100 %.  PHYSICAL EXAMINATION:   Physical Exam  Constitutional: He is oriented to person, place, and time and well-developed, well-nourished, and in no distress. No distress.  HENT:  Head: Normocephalic.  Eyes: No scleral icterus.  Neck: Normal range of motion. Neck supple. No JVD present. No tracheal deviation present.  Cardiovascular: Normal rate and regular rhythm.  Exam reveals no gallop and no friction rub.   Murmur heard. Pulmonary/Chest: Effort normal and breath sounds normal. No respiratory distress. He has no wheezes. He  has no rales. He exhibits no tenderness.  Abdominal: Soft. Bowel sounds are normal. He exhibits no distension and no mass. There is no tenderness. There is no rebound and no guarding.  Musculoskeletal: Normal range of motion. He exhibits no edema.  Neurological: He is alert and oriented to person, place, and time.  Skin: Skin is warm. No rash noted. No erythema.  Psychiatric:  Affect improved      LABORATORY PANEL:   CBC  Recent Labs Lab 04/26/16 1917  WBC 7.0  HGB 9.8*  HCT 29.8*  PLT 235   ------------------------------------------------------------------------------------------------------------------  Chemistries   Recent Labs Lab 04/29/16 0513  NA 137  K 3.6  CL 98*  CO2 30  GLUCOSE 78  BUN 23*  CREATININE 3.55*  CALCIUM 8.4*  MG 1.8   ------------------------------------------------------------------------------------------------------------------  Cardiac Enzymes  Recent Labs Lab 04/26/16 0919  TROPONINI 0.09*   ------------------------------------------------------------------------------------------------------------------  RADIOLOGY:  Dg Chest 1 View  Addendum Date: 04/29/2016   ADDENDUM REPORT: 04/29/2016 16:10 ADDENDUM: The question has been raised regarding the presence of changes suggesting active TB. No evidence of active TB noted. Again follow-up chest x-ray is suggested however to ensure the absence of previously identified right lung pulmonary nodule . A PA and lateral chest x-ray should be obtained. Electronically Signed   By: Maisie Fushomas  Register   On: 04/29/2016 16:10   Result Date: 04/29/2016 CLINICAL DATA:  Congestion. EXAM: CHEST 1 VIEW COMPARISON:  04/26/2016 . FINDINGS: Right IJ sheath a cavoatrial junction. Cardiomegaly. No evidence of overt congestive heart failure. No focal infiltrate. Previously identified nodular density over the right mid lung is not identified on today's exam. Continued follow-up suggested. Small left pleural  effusion cannot be excluded. No pneumothorax . IMPRESSION: 1. Right IJ sheath noted at the cavoatrial junction. 2.  Stable cardiomegaly. 3. Previously identified nodular density over the right mid lung is not identified on today's exam. Continued follow-up suggested. Electronically Signed: By: Maisie Fushomas  Register On: 04/29/2016 07:18     ASSESSMENT AND PLAN:    82 year old male with a history of chronic kidney disease stage V now with end-stage renal disease [facility] and hypertension who presented for evaluation for ESRD.  1. ESRD: Patient has done well with initiation of dialysis. He has had 3 HD treatments. We are waiting for outpatient HD placement. He was evaluated by Dr Cherly Hensenhang (his outpatient Nephrologist0 and as per her note, consider referral to [facility] to place access (outpatient)  2. Depression with suicidal ideation: Patient is now currently denying suicidal ideation. His mood seems to have dramatically improved.  Continue antidepressants as this seems to have really helped. I will await for Dr Toni Amendlapacs to d/c sitter.  3. Anemia chronic disease: Patient started on EPOGEN.  4. Essential hypertension: Continue hydralazine, lisinopril, nifedipine and Norvasc.  5. Elevated troponin: This is due to poor renal clearance and not ACS.  6. Hyponatremia: This is improved with dialysis and discontinuation of torsemide.   7. Former smoker: Patient stopped smoking in the beginning of August. Continue nicotine patch.  Management plans discussed with the patient and he is in agreement.  CODE STATUS: FULL  TOTAL TIME TAKING CARE OF THIS PATIENT: 25 minutes.     Patient ready for discharge once cleared by psychiatry and he has an outpatient for dialysis.  Sylus Stgermain M.D on 04/30/2016 at 12:00 PM  Between 7am to 6pm - Pager - [phone] After 6pm go to [email or web address] - password [email or web address]  [facility]  Office  [phone]  CC: Primary care physician;  Lauro RegulusANDERSON,MARSHALL W., MD  Note: This dictation was prepared with Dragon dictation along with smaller phrase technology. Any transcriptional errors that result from this process are unintentional.

## 2016-04-30 NOTE — Progress Notes (Signed)
This note also relates to the following rows which could not be included: Pulse Rate - Cannot attach notes to unvalidated device data Resp - Cannot attach notes to unvalidated device data SpO2 - Cannot attach notes to unvalidated device data  END OF HD 

## 2016-04-30 NOTE — Progress Notes (Signed)
PRE HD INFO 

## 2016-05-01 LAB — MAGNESIUM: Magnesium: 2 mg/dL (ref 1.7–2.4)

## 2016-05-01 LAB — PHOSPHORUS: Phosphorus: 2.8 mg/dL (ref 2.5–4.6)

## 2016-05-01 MED ORDER — FLUOXETINE HCL 10 MG PO CAPS: 10.0000 mg | ORAL_CAPSULE | Freq: Every day | ORAL | 2 refills | Status: DC

## 2016-05-01 MED ORDER — METOPROLOL TARTRATE 5 MG/5ML IV SOLN
2.5000 mg | Freq: Once | INTRAVENOUS | Status: AC
  Administered 2016-05-01: 2.5 mg via INTRAVENOUS
  Filled 2016-05-01: qty 5

## 2016-05-01 NOTE — Progress Notes (Signed)
POST TREATMENT ASSESSMENT 

## 2016-05-01 NOTE — Progress Notes (Signed)
PRE DIALYSIS ASSESSMENT 

## 2016-05-01 NOTE — Progress Notes (Signed)
[name]  ROUNDING NOTE   Subjective:  Patient in good spirits this a.m. He is due for another dialysis session today. He is likely for discharge after dialysis today.  Objective:  Vital signs in last 24 hours:  Temp:  [98.3 F (36.8 C)-99.1 F (37.3 C)] 99.1 F (37.3 C) (08/18 2031) Pulse Rate:  [33-107] 84 (08/19 0952) Resp:  [13-21] 18 (08/18 2031) BP: (141-168)/(51-79) 168/63 (08/19 0952) SpO2:  [97 %-100 %] 97 % (08/19 0747) Weight:  [70 kg (154 lb 5.2 oz)] 70 kg (154 lb 5.2 oz) (08/18 1250)  Weight change:  Filed Weights   04/28/16 1528 04/30/16 0915 04/30/16 1250  Weight: 68.7 kg (151 lb 7.3 oz) 71.2 kg (156 lb 15.5 oz) 70 kg (154 lb 5.2 oz)    Intake/Output: I/O last 3 completed shifts: In: 800 [P.O.:800] Out: 1250 [Urine:250; Other:1000]   Intake/Output this shift:  No intake/output data recorded.  Physical Exam: General: No acute distress  Head: Normocephalic, atraumatic. Moist oral mucosal membranes  Eyes: Anicteric  Neck: Supple, trachea midline  Lungs:  Clear to auscultation, normal effort  Heart: S1S2 no rubs  Abdomen:  Soft, nontender, Bowel sounds present   Extremities: No peripheral edema.  Neurologic: Nonfocal, moving all four extremities, depressed mood  Skin: No lesions  Access: R IJ permcath placed 04/26/16    Basic Metabolic Panel:  Recent Labs Lab 04/26/16 0919 04/26/16 1917 04/27/16 0450 04/29/16 0513  NA 130* 134* 136 137  K 5.2* 4.8 4.8 3.6  CL 96* 100* 101 98*  CO2 19* 21* 23 30  GLUCOSE 102* 83 86 78  BUN 118* 82* 87* 23*  CREATININE 9.43* 7.45*  7.45* 7.62* 3.55*  CALCIUM 8.9 8.9 8.6* 8.4*  MG  --   --   --  1.8  PHOS  --  5.4*  --   --     Liver Function Tests:  Recent Labs Lab 04/26/16 1917  ALBUMIN 3.4*   No results for input(s): LIPASE, AMYLASE in the last 168 hours. No results for input(s): AMMONIA in the last 168 hours.  CBC:  Recent Labs Lab 04/26/16 0919 04/26/16 1917  WBC 4.6 7.0   HGB 10.0* 9.8*  HCT 30.1* 29.8*  MCV 84.0 84.3  PLT 239 235    Cardiac Enzymes:  Recent Labs Lab 04/26/16 0919  TROPONINI 0.09*    BNP: Invalid input(s): POCBNP  CBG:  Recent Labs Lab 04/26/16 0948  GLUCAP 99    Microbiology: Results for orders placed or performed during the hospital encounter of 04/26/16  MRSA PCR Screening     Status: None   Collection Time: 04/26/16  7:05 PM  Result Value Ref Range Status   MRSA by PCR NEGATIVE NEGATIVE Final    Comment:        The GeneXpert MRSA Assay (FDA approved for NASAL specimens only), is one component of a comprehensive MRSA colonization surveillance program. It is not intended to diagnose MRSA infection nor to guide or monitor treatment for MRSA infections.     Coagulation Studies: No results for input(s): LABPROT, INR in the last 72 hours.  Urinalysis: No results for input(s): COLORURINE, LABSPEC, PHURINE, GLUCOSEU, HGBUR, BILIRUBINUR, KETONESUR, PROTEINUR, UROBILINOGEN, NITRITE, LEUKOCYTESUR in the last 72 hours.  Invalid input(s): APPERANCEUR    Imaging: No results found.   Medications:     . albuterol  2.5 mg Inhalation Daily  . allopurinol  100 mg Oral Daily  . amLODipine  10 mg Oral Daily  .  aspirin EC  81 mg Oral Daily  . cholecalciferol  1,000 Units Oral Daily  . cloNIDine  0.2 mg Oral BID  . epoetin (EPOGEN/PROCRIT) injection  4,000 Units Intravenous Q M,W,F-HD  . ferrous sulfate  325 mg Oral Daily  . FLUoxetine  10 mg Oral Daily  . heparin  5,000 Units Subcutaneous Q8H  . hydrALAZINE  100 mg Oral TID  . lisinopril  20 mg Oral QHS  . nicotine  21 mg Transdermal Daily  . NIFEdipine  30 mg Oral Daily  . sodium chloride flush  3 mL Intravenous Q12H   sodium chloride, acetaminophen **OR** acetaminophen, ondansetron **OR** ondansetron (ZOFRAN) IV, senna-docusate, sodium chloride flush  Assessment/ Plan:  82 y.o. male with a PMHx of Hypertension, gout, chronic kidney disease stage V,  anemia of chronic kidney disease, secondary hyperparathyroidism, metabolic acidosis who was [facility] for evaluation of end-stage renal disease.   1. End-stage renal disease [facility] by [facility]. It appears that the patient has approached end-stage renal disease. [facility]ly [facility] of [facility] has been attempting to place a tunneled dialysis catheter, however the patient's family had some difficulty in getting the [facility].  Had suicidal ideation shortly after starting dialysis. - We will plan for dialysis today as the patient will be on a Tuesday, Thursday, Saturday schedule as an outpatient.  He is being placed at [address] dialysis under [facility].  2. Anemia of chronic kidney disease. Patient to continue Epogen with dialysis here. He would benefit from this as an outpatient as well.  3. Secondary hyperparathyroidism.  Continue to monitor phosphorus as an outpatient.   4. Metabolic acidosis. This has resolved with successive dialysis treatments. Most recent serum bicarbonate was 30.  5. Hypertension.  Blood pressure a bit high this a.m. at 168/63.  However overall control has improved significantly.. Continue hydralazine, lisinopril, nifedipine, and amlodipine.   LOS: 5 Canisha Issac 8/19/201710:22 AM

## 2016-05-01 NOTE — Progress Notes (Signed)
[facility] - [place] at [facility]   PATIENT NAME: Eric Yu    MR#:  [id number]  DATE OF BIRTH:  July 11, 1934  SUBJECTIVE:  CHIEF COMPLAINT:   Chief Complaint  Patient presents with  . Weakness   -Awaiting to schedule outpatient dialysis. Patient denies any complaints. Due for dialysis today.  REVIEW OF SYSTEMS:  Review of Systems  Constitutional: Negative for chills and fever.  HENT: Negative for ear discharge, ear pain and nosebleeds.   Eyes: Negative for blurred vision and double vision.  Respiratory: Negative for cough, shortness of breath and wheezing.   Cardiovascular: Negative for chest pain, palpitations and leg swelling.  Gastrointestinal: Negative for abdominal pain, constipation, diarrhea, nausea and vomiting.  Genitourinary: Negative for dysuria.  Musculoskeletal: Negative for back pain, myalgias and neck pain.  Neurological: Positive for weakness. Negative for dizziness, sensory change, speech change, focal weakness, seizures and headaches.  Psychiatric/Behavioral: Negative for depression.    DRUG ALLERGIES:  No Known Allergies  VITALS:  Blood pressure 130/63, pulse 85, temperature 99 F (37.2 C), temperature source Oral, resp. rate 18, height 5\' 8"  (1.727 m), weight 71.4 kg (157 lb 6.5 oz), SpO2 97 %.  PHYSICAL EXAMINATION:  Physical Exam  GENERAL:  82 y.o.-year-old patient lying in the bed with no acute distress.  EYES: Pupils equal, round, reactive to light and accommodation. No scleral icterus. Extraocular muscles intact.  HEENT: Head atraumatic, normocephalic. Oropharynx and nasopharynx clear. edentulous NECK:  Supple, no jugular venous distention. No thyroid enlargement, no tenderness.  LUNGS: Normal breath sounds bilaterally, no wheezing, rales,rhonchi or crepitation. No use of accessory muscles of respiration.  CARDIOVASCULAR: S1, S2 normal. No rubs, or gallops. 3/6 systolic murmur heard Right chest permacath present. ABDOMEN:  Soft, nontender, nondistended. Bowel sounds present. No organomegaly or mass.  EXTREMITIES: No pedal edema, cyanosis, or clubbing.  NEUROLOGIC: Cranial nerves II through XII are intact. Muscle strength 5/5 in all extremities. Sensation intact. Gait not checked.  PSYCHIATRIC: The patient is alert and oriented x 3.  SKIN: No obvious rash, lesion, or ulcer.    LABORATORY PANEL:   CBC  Recent Labs Lab 04/26/16 1917  WBC 7.0  HGB 9.8*  HCT 29.8*  PLT 235   ------------------------------------------------------------------------------------------------------------------  Chemistries   Recent Labs Lab 04/29/16 0513  NA 137  K 3.6  CL 98*  CO2 30  GLUCOSE 78  BUN 23*  CREATININE 3.55*  CALCIUM 8.4*  MG 1.8   ------------------------------------------------------------------------------------------------------------------  Cardiac Enzymes  Recent Labs Lab 04/26/16 0919  TROPONINI 0.09*   ------------------------------------------------------------------------------------------------------------------  RADIOLOGY:  No results found.  EKG:   Orders placed or performed during the hospital encounter of 04/26/16  . ED EKG  . ED EKG  . EKG 12-Lead  . EKG 12-Lead    ASSESSMENT AND PLAN:   82 year old male with past medical history significant for CK D stage V, anemia, hypertension presents to hospital with acute on chronic kidney disease.  #1 end-stage renal disease-initiated on hemodialysis this admission. Today's day 5 of treatment. -Awaiting outpatient hemodialysis appointment set up. -Appreciate nephrology consult.  #2 depression with suicidal ideation on admission-much improved. On low dose fluoxetine. -Suicide precautions discontinued. Appreciate psychiatric consult.  #3 anemia of chronic disease-Epogen with dialysis  #4 hypertension-continue hydralazine, lisinopril, clonidine, nifedipine and Norvasc  #5 elevated troponin-due to poor renal clearance and  also demand ischemia. This is not ACS.  #6 DVT prophylaxis-on subcutaneous heparin   All the records are reviewed and case discussed with Care  Management/Social Workerr. Management plans discussed with the patient, family and they are in agreement.  CODE STATUS: Full code  TOTAL TIME TAKING CARE OF THIS PATIENT: 37 minutes.   POSSIBLE D/C IN 1-2 DAYS, DEPENDING ON CLINICAL CONDITION.   Enid BaasKALISETTI,Dorotha Hirschi M.D on 05/01/2016 at 12:11 PM  Between 7am to 6pm - Pager - [phone]  After 6pm go to [email or web address] - password [email or web address]  [facility] [place] [facility]  Office  [phone]  CC: Primary care physician; Lauro RegulusANDERSON,MARSHALL W., MD

## 2016-05-01 NOTE — Progress Notes (Signed)
Notified Dr. Lenord FellersHuglemeyer of pts 10 beat run of Vtach (reported by C[facility]). MD placed orders. Will continue to monitor.

## 2016-05-01 NOTE — Progress Notes (Signed)
Hd started  

## 2016-05-01 NOTE — Progress Notes (Signed)
HD COMPLETED  

## 2016-05-01 NOTE — Care Management Note (Signed)
Case Management Note  Patient Details  Name: Lorra HalsJohn Roseboom MRN: [id number] Date of Birth: [birth year]  Subjective/Objective:     This Clinical research associatewriter contacted the [facility] office on [address] in [place] ph; [phone] and was told that their scheduler would not be available until Monday 05/03/16 at 9am. That office does not have a scheduled appointment on file for Mr Solon AugustaBynum. This Clinical research associatewriter then called [phone] and left a voice message requesting a call back to schedule an appointment for Mr Solon [facility]Bynum at [facility] [address] in [place]. Currently awaiting a call-back. Dr Nemiah CommanderKalisetti is aware.                Action/Plan:   Expected Discharge Date:                  Expected Discharge Plan:     In-House Referral:     Discharge planning Services     Post Acute Care Choice:    Choice offered to:     DME Arranged:    DME Agency:     HH Arranged:    HH Agency:     Status of Service:     If discussed at [employer] Length of Stay Meetings, dates discussed:    Additional Comments:  Shadana Pry A, RN 05/01/2016, 10:08 AM

## 2016-05-02 LAB — BASIC METABOLIC PANEL
Anion gap: 8 (ref 5–15)
BUN: 15 mg/dL (ref 6–20)
CHLORIDE: 100 mmol/L — AB (ref 101–111)
CO2: 29 mmol/L (ref 22–32)
CREATININE: 2.7 mg/dL — AB (ref 0.61–1.24)
Calcium: 8.7 mg/dL — ABNORMAL LOW (ref 8.9–10.3)
GFR calc non Af Amer: 21 mL/min — ABNORMAL LOW (ref >60)
GFR, EST AFRICAN AMERICAN: 24 mL/min — AB (ref >60)
GLUCOSE: 85 mg/dL (ref 65–99)
Potassium: 3.2 mmol/L — ABNORMAL LOW (ref 3.5–5.1)
Sodium: 137 mmol/L (ref 135–145)

## 2016-05-02 MED ORDER — POTASSIUM CHLORIDE CRYS ER 20 MEQ PO TBCR
40.0000 meq | EXTENDED_RELEASE_TABLET | Freq: Once | ORAL | Status: AC
  Administered 2016-05-02: 40 meq via ORAL
  Filled 2016-05-02: qty 2

## 2016-05-02 NOTE — Evaluation (Signed)
Physical Therapy Evaluation Patient Details Name: Eric Yu MRN: [id number] DOB: 08/18/1934 Today's Date: 05/02/2016   History of Present Illness  Patient is a 82 y/o male that presents with elevated creatinine, decided on permcath and dialysis going forward.   Clinical Impression  Patient evaluated after permcath placement for chronic kidney disease. He reports he lives at home alone and has PRN/intermittent assistance at the house. He has been ambulating with SPC, upon this evaluation this was deemed unsafe as he had asymmetric step lengths, lateral sway with SPC. He was provided with RW and demonstrated notable improvement in gait speed, step length, reduction in lateral sway. He was educated extensively on use of RW at home and having more frequent supervision for mobility. Would recommend HHPT as well to improve his safety with mobility.     Follow Up Recommendations Home health PT;Supervision/Assistance - 24 hour    Equipment Recommendations  Rolling walker with 5" wheels    Recommendations for Other Services       Precautions / Restrictions Precautions Precautions: Fall Restrictions Weight Bearing Restrictions: No      Mobility  Bed Mobility Overal bed mobility: Needs Assistance Bed Mobility: Sit to Supine       Sit to supine: Supervision   General bed mobility comments: Patient is able to return into bed without PT assistance, though slowly.   Transfers Overall transfer level: Needs assistance Equipment used: Straight cane Transfers: Sit to/from Stand Sit to Stand: Min guard         General transfer comment: Patient demonstrates mild loss of balance during transfer, no overt loss of balance but unsteady.   Ambulation/Gait Ambulation/Gait assistance: Min guard Ambulation Distance (Feet): 120 Feet Assistive device: Rolling walker (2 wheeled) Gait Pattern/deviations: Step-through pattern;Narrow base of support;Shuffle;Decreased step length - right;Decreased  step length - left;Trunk flexed   Gait velocity interpretation: <1.8 ft/sec, indicative of risk for recurrent falls General Gait Details: Patient initially ambulates with SPC in RUE, noted to have markedly decreased step length and asymmetric step lengths. Patient provided with RW, notable increase in speed and step length indicative of decreased falls risk. Patient begins to fatigue quickly, no overt loss of balance noted.   Stairs            Wheelchair Mobility    Modified Rankin (Stroke Patients Only)       Balance Overall balance assessment: Needs assistance Sitting-balance support: No upper extremity supported Sitting balance-Leahy Scale: Fair     Standing balance support: Bilateral upper extremity supported Standing balance-Leahy Scale: Fair                               Pertinent Vitals/Pain Pain Assessment: No/denies pain    Home Living                        Prior Function                 Hand Dominance        Extremity/Trunk Assessment   Upper Extremity Assessment: Overall WFL for tasks assessed           Lower Extremity Assessment: Overall WFL for tasks assessed         Communication      Cognition Arousal/Alertness: Awake/alert Behavior During Therapy: WFL for tasks assessed/performed Overall Cognitive Status: Within Functional Limits for tasks assessed  General Comments      Exercises        Assessment/Plan    PT Assessment Patient needs continued PT services  PT Diagnosis Difficulty walking;Generalized weakness   PT Problem List Decreased strength;Decreased mobility;Decreased safety awareness;Decreased activity tolerance;Decreased balance;Decreased knowledge of use of DME  PT Treatment Interventions DME instruction;Gait training;Therapeutic exercise;Therapeutic activities;Stair training;Balance training   PT Goals (Current goals can be found in the Care Plan section)  Acute Rehab PT Goals Patient Stated Goal: To return home safely  PT Goal Formulation: With patient Time For Goal Achievement: 05/16/16 Potential to Achieve Goals: Good    Frequency Min 2X/week   Barriers to discharge Decreased caregiver support Patient educated he would need 24 hour assistance at home for safety     Co-evaluation               End of Session Equipment Utilized During Treatment: Gait belt Activity Tolerance: Patient tolerated treatment well;Patient limited by fatigue Patient left: in bed;with bed alarm set;with call bell/phone within reach;with family/visitor present Nurse Communication: Mobility status         Time: 1345-1400 PT Time Calculation (min) (ACUTE ONLY): 15 min   Charges:   PT Evaluation $PT Eval Moderate Complexity: 1 Procedure     PT G Codes:       Kerin RansomPatrick A Chisum Habenicht, PT, DPT    05/02/2016, 2:36 PM

## 2016-05-02 NOTE — Progress Notes (Signed)
Pt has requested that all four bed rails be up - he states "it makes me feel safer"

## 2016-05-02 NOTE — Progress Notes (Signed)
[name]  ROUNDING NOTE   Subjective:  Patient seen at bedside. He remains in better spirits as compared to when he first started dialysis. He completed hemodialysis yesterday. Still waiting outpatient placement at [address]  Objective:  Vital signs in last 24 hours:  Temp:  [97.9 F (36.6 C)-99.3 F (37.4 C)] 99.3 F (37.4 C) (08/20 0536) Pulse Rate:  [76-87] 77 (08/20 1003) Resp:  [15-21] 20 (08/20 0536) BP: (125-172)/(57-73) 144/61 (08/20 1003) SpO2:  [98 %-99 %] 98 % (08/20 0742) Weight:  [68.9 kg (152 lb)-70.1 kg (154 lb 8.7 oz)] 68.9 kg (152 lb) (08/19 1449)  Weight change: 0.2 kg (7.1 oz) Filed Weights   05/01/16 1035 05/01/16 1405 05/01/16 1449  Weight: 71.4 kg (157 lb 6.5 oz) 70.1 kg (154 lb 8.7 oz) 68.9 kg (152 lb)    Intake/Output: I/O last 3 completed shifts: In: 360 [P.O.:360] Out: 1000 [Other:1000]   Intake/Output this shift:  No intake/output data recorded.  Physical Exam: General: No acute distress  Head: Normocephalic, atraumatic. Moist oral mucosal membranes  Eyes: Anicteric  Neck: Supple, trachea midline  Lungs:  Clear to auscultation, normal effort  Heart: S1S2 no rubs  Abdomen:  Soft, nontender, Bowel sounds present   Extremities: No peripheral edema.  Neurologic: Nonfocal, moving all four extremities  Skin: No lesions  Access: R IJ permcath placed 04/26/16    Basic Metabolic Panel:  Recent Labs Lab 04/26/16 0919 04/26/16 1917 04/27/16 0450 04/29/16 0513 05/01/16 1039 05/01/16 2253 05/02/16 0549  NA 130* 134* 136 137  --   --  137  K 5.2* 4.8 4.8 3.6  --   --  3.2*  CL 96* 100* 101 98*  --   --  100*  CO2 19* 21* 23 30  --   --  29  GLUCOSE 102* 83 86 78  --   --  85  BUN 118* 82* 87* 23*  --   --  15  CREATININE 9.43* 7.45*  7.45* 7.62* 3.55*  --   --  2.70*  CALCIUM 8.9 8.9 8.6* 8.4*  --   --  8.7*  MG  --   --   --  1.8  --  2.0  --   PHOS  --  5.4*  --   --  2.8  --   --     Liver Function  Tests:  Recent Labs Lab 04/26/16 1917  ALBUMIN 3.4*   No results for input(s): LIPASE, AMYLASE in the last 168 hours. No results for input(s): AMMONIA in the last 168 hours.  CBC:  Recent Labs Lab 04/26/16 0919 04/26/16 1917  WBC 4.6 7.0  HGB 10.0* 9.8*  HCT 30.1* 29.8*  MCV 84.0 84.3  PLT 239 235    Cardiac Enzymes:  Recent Labs Lab 04/26/16 0919  TROPONINI 0.09*    BNP: Invalid input(s): POCBNP  CBG:  Recent Labs Lab 04/26/16 0948  GLUCAP 99    Microbiology: Results for orders placed or performed during the hospital encounter of 04/26/16  MRSA PCR Screening     Status: None   Collection Time: 04/26/16  7:05 PM  Result Value Ref Range Status   MRSA by PCR NEGATIVE NEGATIVE Final    Comment:        The GeneXpert MRSA Assay (FDA approved for NASAL specimens only), is one component of a comprehensive MRSA colonization surveillance program. It is not intended to diagnose MRSA infection nor to guide or monitor treatment for MRSA  infections.     Coagulation Studies: No results for input(s): LABPROT, INR in the last 72 hours.  Urinalysis: No results for input(s): COLORURINE, LABSPEC, PHURINE, GLUCOSEU, HGBUR, BILIRUBINUR, KETONESUR, PROTEINUR, UROBILINOGEN, NITRITE, LEUKOCYTESUR in the last 72 hours.  Invalid input(s): APPERANCEUR    Imaging: No results found.   Medications:     . albuterol  2.5 mg Inhalation Daily  . allopurinol  100 mg Oral Daily  . amLODipine  10 mg Oral Daily  . aspirin EC  81 mg Oral Daily  . cholecalciferol  1,000 Units Oral Daily  . cloNIDine  0.2 mg Oral BID  . epoetin (EPOGEN/PROCRIT) injection  4,000 Units Intravenous Q M,W,F-HD  . ferrous sulfate  325 mg Oral Daily  . FLUoxetine  10 mg Oral Daily  . heparin  5,000 Units Subcutaneous Q8H  . hydrALAZINE  100 mg Oral TID  . lisinopril  20 mg Oral QHS  . nicotine  21 mg Transdermal Daily  . NIFEdipine  30 mg Oral Daily  . sodium chloride flush  3 mL  Intravenous Q12H   sodium chloride, acetaminophen **OR** acetaminophen, ondansetron **OR** ondansetron (ZOFRAN) IV, senna-docusate, sodium chloride flush  Assessment/ Plan:  82 y.o. male with a PMHx of Hypertension, gout, chronic kidney disease stage V, anemia of chronic kidney disease, secondary hyperparathyroidism, metabolic acidosis who was [facility] 04[facility]26/2016 for evaluation of end-stage renal disease.   1. End-stage renal disease [facility]. It appears that the patient has approached end-stage renal disease. Dr. Cherly H[facility]hang of [facility] neph[facility] has been attempting to place a tunneled dialysis catheter, however the patient's family had some difficulty in getting the [facility].  Had suicidal ideation shortly after starting dialysis. - Patient's affect has improved significantly over the past several days. He had hemodialysis yesterday. We are still awaiting placement [facility] [address] [facility].  He will be [facility] there. No acute indication for dialysis today.   2. Anemia of chronic kidney disease. Continue Epogen as an outpatient.   3. Secondary hyperparathyroidism. Phosphorus down to 2.8 and acceptable.  4. Metabolic acidosis.  Resolved with dialysis.   5. Hypertension.  Blood pressure acceptable at 144/61. Continue hydralazine, lisinopril, nifedipine, and amlodipine.   LOS: 6 Parissa Chiao 8/20/201711:23 AM

## 2016-05-02 NOTE — Progress Notes (Addendum)
[facility] - [place] at [facility]   PATIENT NAME: Eric HalsJohn Morad    MR#:  [id number]  DATE OF BIRTH:  1934-09-03  SUBJECTIVE:  CHIEF COMPLAINT:   Chief Complaint  Patient presents with  . Weakness   -No complaints today except didn't sleep well last night - 10 beat run of NSVT noted- but asymptomatic  REVIEW OF SYSTEMS:  Review of Systems  Constitutional: Negative for chills and fever.  HENT: Negative for ear discharge, ear pain and nosebleeds.   Eyes: Negative for blurred vision and double vision.  Respiratory: Negative for cough, shortness of breath and wheezing.   Cardiovascular: Negative for chest pain, palpitations and leg swelling.  Gastrointestinal: Negative for abdominal pain, constipation, diarrhea, nausea and vomiting.  Genitourinary: Negative for dysuria.  Musculoskeletal: Negative for back pain, myalgias and neck pain.  Neurological: Positive for weakness. Negative for dizziness, sensory change, speech change, focal weakness, seizures and headaches.  Psychiatric/Behavioral: Negative for depression.    DRUG ALLERGIES:  No Known Allergies  VITALS:  Blood pressure (!) 136/57, pulse 81, temperature 99.3 F (37.4 C), temperature source Oral, resp. rate 20, height 5\' 8"  (1.727 m), weight 68.9 kg (152 lb), SpO2 98 %.  PHYSICAL EXAMINATION:  Physical Exam  GENERAL:  82 y.o.-year-old patient lying in the bed with no acute distress.  EYES: Pupils equal, round, reactive to light and accommodation. No scleral icterus. Extraocular muscles intact.  HEENT: Head atraumatic, normocephalic. Oropharynx and nasopharynx clear. edentulous NECK:  Supple, no jugular venous distention. No thyroid enlargement, no tenderness.  LUNGS: Normal breath sounds bilaterally, no wheezing, rales,rhonchi or crepitation. No use of accessory muscles of respiration.  CARDIOVASCULAR: S1, S2 normal. No rubs, or gallops. 3/6 systolic murmur heard Right chest permacath  present. ABDOMEN: Soft, nontender, nondistended. Bowel sounds present. No organomegaly or mass.  EXTREMITIES: No pedal edema, cyanosis, or clubbing.  NEUROLOGIC: Cranial nerves II through XII are intact. Muscle strength 5/5 in all extremities. Sensation intact. Gait not checked.  PSYCHIATRIC: The patient is alert and oriented x 3.  SKIN: No obvious rash, lesion, or ulcer.    LABORATORY PANEL:   CBC  Recent Labs Lab 04/26/16 1917  WBC 7.0  HGB 9.8*  HCT 29.8*  PLT 235   ------------------------------------------------------------------------------------------------------------------  Chemistries   Recent Labs Lab 05/01/16 2253 05/02/16 0549  NA  --  137  K  --  3.2*  CL  --  100*  CO2  --  29  GLUCOSE  --  85  BUN  --  15  CREATININE  --  2.70*  CALCIUM  --  8.7*  MG 2.0  --    ------------------------------------------------------------------------------------------------------------------  Cardiac Enzymes  Recent Labs Lab 04/26/16 0919  TROPONINI 0.09*   ------------------------------------------------------------------------------------------------------------------  RADIOLOGY:  No results found.  EKG:   Orders placed or performed during the hospital encounter of 04/26/16  . ED EKG  . ED EKG  . EKG 12-Lead  . EKG 12-Lead    ASSESSMENT AND PLAN:   82 year old male with past medical history significant for CK D stage V, anemia, hypertension presents to hospital with acute on chronic kidney disease.  #1 End-stage renal disease-initiated on hemodialysis this admission. Received 5 days of treatment- last one yesterday. -Awaiting outpatient hemodialysis appointment set up. -Appreciate nephrology consult.  #2 depression with suicidal ideation on admission-much improved. Not suicidal anymore. On low dose fluoxetine. -Suicide precautions discontinued. Appreciate psychiatric consult.  #3 anemia of chronic disease-Epogen with dialysis  #4  hypertension-continue hydralazine,  lisinopril, clonidine, nifedipine and Norvasc  #5 NSVT- low potassium- replace and monitor as dialysis patient. Asymptomatic Normal magnesium Outpatient ECHO 2 weeks ago with LVH and EF 60%  #6 DVT prophylaxis-on subcutaneous heparin  Physical Therapy today\ Possible discharge home tomorrow if dialysis set for outpatient   All the records are reviewed and case discussed with Care Management/Social Workerr. Management plans discussed with the patient, family and they are in agreement.  CODE STATUS: Full code  TOTAL TIME TAKING CARE OF THIS PATIENT: 35 minutes.   POSSIBLE D/C TOMORROW, DEPENDING ON CLINICAL CONDITION.   Malissa Slay M.D on 05/02/2016 at 9:38 AM  Between 7am to 6pm - Pager - [phone]  After 6pm go to [email or web address] - password [email or web address]  [facility] [place] [facility]  Office  [phone]  CC: Primary care physician; Lauro RegulusANDERSON,MARSHALL W., MD

## 2016-05-03 LAB — BASIC METABOLIC PANEL
Anion gap: 9 (ref 5–15)
BUN: 33 mg/dL — ABNORMAL HIGH (ref 6–20)
CALCIUM: 8.8 mg/dL — AB (ref 8.9–10.3)
CO2: 27 mmol/L (ref 22–32)
CREATININE: 3.74 mg/dL — AB (ref 0.61–1.24)
Chloride: 100 mmol/L — ABNORMAL LOW (ref 101–111)
GFR calc non Af Amer: 14 mL/min — ABNORMAL LOW (ref >60)
GFR, EST AFRICAN AMERICAN: 16 mL/min — AB (ref >60)
Glucose, Bld: 91 mg/dL (ref 65–99)
Potassium: 3.7 mmol/L (ref 3.5–5.1)
Sodium: 136 mmol/L (ref 135–145)

## 2016-05-03 MED ORDER — GABAPENTIN 100 MG PO CAPS: 100.0000 mg | ORAL_CAPSULE | Freq: Every day | ORAL | 0 refills | Status: DC

## 2016-05-03 MED ORDER — GABAPENTIN 100 MG PO CAPS: 100.0000 mg | ORAL_CAPSULE | Freq: Every day | ORAL | Status: DC

## 2016-05-03 NOTE — Progress Notes (Signed)
Discharge paperwork reviewed with patient and patient's sister at patient's request. Both verbalized understanding, all questions answered. Prescriptions sent electronically. Patient's sister to transport home.

## 2016-05-03 NOTE — Care Management (Signed)
[facility]bt[facility] [address] through [facility] that patient has a chair time [facility] at 11:45.  Dr Wynelle LinkKolluru will provide the dialysis order and CM will fax the orders and discharge summary to [facility].  Spoke with patient's sisterKara Mead- Emma.  Discussed the dialysis time and emphasized that first clinic day is tomorrow  and should arrive about 30 mins early for initial paper work.  Kara Meadmma asks if patient can go to a facility for about 3 months "while everyone gets things straight'.  Discussed that medicare humana would have to authorize snf and physical therapy has recommend home with home health.  He ambulated 120 feet with walker.  Discussed that insurance does not cover stay for custodial needs which is what patient's sister is describing."   Provided with list of agencies that can provide in home care and made referral to home health for SN PT Aide and social work.  Chose [facility]. Patient and his sister confirm patient has a walker.  Provided medic alert information.  UPdated primary nurse and attending

## 2016-05-03 NOTE — Progress Notes (Signed)
[name]  ROUNDING NOTE   Subjective:   Laying in bed. Resting comfortably  Objective:  Vital signs in last 24 hours:  Temp:  [98.4 F (36.9 C)-98.5 F (36.9 C)] 98.5 F (36.9 C) (08/20 2108) Pulse Rate:  [69-88] 88 (08/21 0900) Resp:  [16-20] 18 (08/21 0900) BP: (126-161)/(62-72) 161/72 (08/21 0900) SpO2:  [98 %-100 %] 100 % (08/21 0900)  Weight change:  Filed Weights   05/01/16 1035 05/01/16 1405 05/01/16 1449  Weight: 71.4 kg (157 lb 6.5 oz) 70.1 kg (154 lb 8.7 oz) 68.9 kg (152 lb)    Intake/Output: I/O last 3 completed shifts: In: 360 [P.O.:360] Out: 0    Intake/Output this shift:  Total I/O In: 240 [P.O.:240] Out: -   Physical Exam: General: No acute distress  Head: Normocephalic, atraumatic. Moist oral mucosal membranes  Eyes: Anicteric  Neck: Supple, trachea midline  Lungs:  Clear to auscultation, normal effort  Heart: S1S2 no rubs  Abdomen:  Soft, nontender, Bowel sounds present   Extremities: No peripheral edema.  Neurologic: Nonfocal, moving all four extremities  Skin: No lesions  Access: R IJ permcath placed 04/26/16    Basic Metabolic Panel:  Recent Labs Lab 04/26/16 1917 04/27/16 0450 04/29/16 0513 05/01/16 1039 05/01/16 2253 05/02/16 0549 05/03/16 0520  NA 134* 136 137  --   --  137 136  K 4.8 4.8 3.6  --   --  3.2* 3.7  CL 100* 101 98*  --   --  100* 100*  CO2 21* 23 30  --   --  29 27  GLUCOSE 83 86 78  --   --  85 91  BUN 82* 87* 23*  --   --  15 33*  CREATININE 7.45*  7.45* 7.62* 3.55*  --   --  2.70* 3.74*  CALCIUM 8.9 8.6* 8.4*  --   --  8.7* 8.8*  MG  --   --  1.8  --  2.0  --   --   PHOS 5.4*  --   --  2.8  --   --   --     Liver Function Tests:  Recent Labs Lab 04/26/16 1917  ALBUMIN 3.4*   No results for input(s): LIPASE, AMYLASE in the last 168 hours. No results for input(s): AMMONIA in the last 168 hours.  CBC:  Recent Labs Lab 04/26/16 1917  WBC 7.0  HGB 9.8*  HCT 29.8*  MCV 84.3  PLT  235    Cardiac Enzymes: No results for input(s): CKTOTAL, CKMB, CKMBINDEX, TROPONINI in the last 168 hours.  BNP: Invalid input(s): POCBNP  CBG: No results for input(s): GLUCAP in the last 168 hours.  Microbiology: Results for orders placed or performed during the hospital encounter of 04/26/16  MRSA PCR Screening     Status: None   Collection Time: 04/26/16  7:05 PM  Result Value Ref Range Status   MRSA by PCR NEGATIVE NEGATIVE Final    Comment:        The GeneXpert MRSA Assay (FDA approved for NASAL specimens only), is one component of a comprehensive MRSA colonization surveillance program. It is not intended to diagnose MRSA infection nor to guide or monitor treatment for MRSA infections.     Coagulation Studies: No results for input(s): LABPROT, INR in the last 72 hours.  Urinalysis: No results for input(s): COLORURINE, LABSPEC, PHURINE, GLUCOSEU, HGBUR, BILIRUBINUR, KETONESUR, PROTEINUR, UROBILINOGEN, NITRITE, LEUKOCYTESUR in the last 72 hours.  Invalid input(s): APPERANCEUR  Imaging: No results found.   Medications:     . albuterol  2.5 mg Inhalation Daily  . allopurinol  100 mg Oral Daily  . amLODipine  10 mg Oral Daily  . aspirin EC  81 mg Oral Daily  . cholecalciferol  1,000 Units Oral Daily  . cloNIDine  0.2 mg Oral BID  . epoetin (EPOGEN/PROCRIT) injection  4,000 Units Intravenous Q M,W,F-HD  . ferrous sulfate  325 mg Oral Daily  . FLUoxetine  10 mg Oral Daily  . gabapentin  100 mg Oral QHS  . heparin  5,000 Units Subcutaneous Q8H  . hydrALAZINE  100 mg Oral TID  . lisinopril  20 mg Oral QHS  . nicotine  21 mg Transdermal Daily  . NIFEdipine  30 mg Oral Daily  . sodium chloride flush  3 mL Intravenous Q12H   sodium chloride, acetaminophen **OR** acetaminophen, ondansetron **OR** ondansetron (ZOFRAN) IV, senna-docusate, sodium chloride flush  Assessment/ Plan:  82 y.o.black male with a PMHx of Hypertension, gout who was [facility]  on 04/26/2016 for evaluation of end-stage renal disease.   1. End-stage renal [facility]. Started on dialysis on 8/14. Completed more than three inpatient treatments. [facility]. Outpatient orders prepared.  TTS Schedule.   2. Anemia of chronic kidney disease. Continue Epogen as an outpatient.   3. Secondary hyperparathyroidism. Phosphorus down to 2.8 and acceptable.  4. Hypertension.   - Continue hydralazine, lisinopril, nifedipine, and amlodipine.   LOS: 7 Jeromie Gainor 8/21/201711:52 AM

## 2016-05-03 NOTE — Discharge Summary (Signed)
[facility] -  at [facility]   PATIENT NAME: Eric Yu    MR#:  [id number]  DATE OF BIRTH:  1934-04-04  DATE OF ADMISSION:  04/26/2016 ADMITTING PHYSICIAN: Annice Needy, MD  DATE OF DISCHARGE: 05/03/2016  PRIMARY CARE PHYSICIAN: Lauro Regulus., MD    ADMISSION DIAGNOSIS:  Weakness [R53.1] Acute on chronic renal failure (HCC) [N17.9, N18.9]  DISCHARGE DIAGNOSIS:  Principal Problem:   Severe major depression, single episode (HCC) Active Problems:   Acute renal failure (ARF) (HCC)   SECONDARY DIAGNOSIS:   Past Medical History:  Diagnosis Date  . Chronic kidney disease   . Gout   . Hypertension   . Iron deficiency     HOSPITAL COURSE:   82 year old male with past medical history significant for CK D stage V, anemia, hypertension presents to hospital with acute on chronic kidney disease.  1 End-stage renal disease: Patient was initiated on hemodialysis during this hospital stay. He was also evaluated by his primary nephrologist during this hospital stay.  As an outpatient placement at [facility] [address] on Tuesday, Thursday and Saturday at 1145.   2. Depression with suicidal ideation on admission: Patient reported severe depression with suicidal ideation. Consider was ordered. Psychiatry consult was also requested. Patient was started on Prozac. He was also complaining of insomnia and is now started on gabapentin 100 mg at bedtime. His depression has much improved. He has not had suicidal ideations or homicidal thoughts. Sitter was discontinued 3 days ago. At this time, He is not found to be a threat to himself or others per psychiatry consult.  3 anemia of chronic disease: Patient will need Epogen with dialysis  4 Essential Hypertension: Patient will need to continue  on hydralazine, lisinopril, clonidine, nifedipine and Norvasc  5 NSVT:  this was due to low potassium. Outpatient ECHO 2 weeks ago with LVH and preserved EF   DISCHARGE  CONDITIONS AND DIET:   Since stable for discharge on diabetic diet  CONSULTS OBTAINED:  Treatment Team:  Audery Amel, MD  DRUG ALLERGIES:  No Known Allergies  DISCHARGE MEDICATIONS:   Current Discharge Medication List    START taking these medications   Details  FLUoxetine (PROZAC) 10 MG capsule Take 1 capsule (10 mg total) by mouth daily. Qty: 30 capsule, Refills: 2    gabapentin (NEURONTIN) 100 MG capsule Take 1 capsule (100 mg total) by mouth at bedtime. Qty: 30 capsule, Refills: 0      CONTINUE these medications which have NOT CHANGED   Details  acetaminophen (TYLENOL) 325 MG tablet Take 325 mg by mouth daily.     albuterol (PROVENTIL HFA;VENTOLIN HFA) 108 (90 Base) MCG/ACT inhaler Inhale 2 puffs into the lungs daily.    allopurinol (ZYLOPRIM) 100 MG tablet Take 100 mg by mouth daily.     amLODipine (NORVASC) 10 MG tablet Take 10 mg by mouth daily.    aspirin EC 81 MG tablet Take 81 mg by mouth daily.    cholecalciferol (VITAMIN D) 1000 units tablet Take 1,000 Units by mouth daily.    cloNIDine (CATAPRES) 0.2 MG tablet Take 0.2 mg by mouth 2 (two) times daily.     ferrous sulfate 325 (65 FE) MG tablet Take 325 mg by mouth daily.     hydrALAZINE (APRESOLINE) 100 MG tablet Take 100 mg by mouth 3 (three) times daily.    nicotine (NICODERM CQ - DOSED IN MG/24 HOURS) 21 mg/24hr patch Place 21 mg onto the skin daily.  lisinopril (PRINIVIL,ZESTRIL) 20 MG tablet Take 20 mg by mouth.    NIFEdipine (PROCARDIA-XL/ADALAT CC) 60 MG 24 hr tablet       STOP taking these medications     torsemide (DEMADEX) 20 MG tablet               Today   CHIEF COMPLAINT:  Patient is doing well this morning. He did not sleep very well however. He denies suicidal or homicidal thoughts. Depression is much better after starting medications.  VITAL SIGNS:  Blood pressure (!) 161/72, pulse 88, temperature 98.5 F (36.9 C), temperature source Oral, resp. rate 18, height 5'  8" (1.727 m), weight 68.9 kg (152 lb), SpO2 100 %.   REVIEW OF SYSTEMS:  Review of Systems  Constitutional: Negative.  Negative for chills, fever and malaise/fatigue.  HENT: Negative.  Negative for ear discharge, ear pain, hearing loss, nosebleeds and sore throat.   Eyes: Negative.  Negative for blurred vision and pain.  Respiratory: Negative.  Negative for cough, hemoptysis, shortness of breath and wheezing.   Cardiovascular: Negative.  Negative for chest pain, palpitations and leg swelling.  Gastrointestinal: Negative.  Negative for abdominal pain, blood in stool, diarrhea, nausea and vomiting.  Genitourinary: Negative.  Negative for dysuria.  Musculoskeletal: Negative.  Negative for back pain.  Skin: Negative.   Neurological: Negative for dizziness, tremors, speech change, focal weakness, seizures and headaches.  Endo/Heme/Allergies: Negative.  Does not bruise/bleed easily.  Psychiatric/Behavioral: Positive for depression (im(roved). Negative for hallucinations and suicidal ideas. The patient has insomnia.      PHYSICAL EXAMINATION:  GENERAL:  82 y.o.-year-old patient lying in the bed with no acute distress.  NECK:  Supple, no jugular venous distention. No thyroid enlargement, no tenderness.  LUNGS: Normal breath sounds bilaterally, no wheezing, rales,rhonchi  No use of accessory muscles of respiration.  CARDIOVASCULAR: S1, S2 normal. No murmurs, rubs, or gallops.  ABDOMEN: Soft, non-tender, non-distended. Bowel sounds present. No organomegaly or mass.  EXTREMITIES: No pedal edema, cyanosis, or clubbing.  PSYCHIATRIC: The patient is alert and oriented x 3.  SKIN: No obvious rash, lesion, or ulcer.   DATA REVIEW:   CBC  Recent Labs Lab 04/26/16 1917  WBC 7.0  HGB 9.8*  HCT 29.8*  PLT 235    Chemistries   Recent Labs Lab 05/01/16 2253  05/03/16 0520  NA  --   < > 136  K  --   < > 3.7  CL  --   < > 100*  CO2  --   < > 27  GLUCOSE  --   < > 91  BUN  --   < > 33*   CREATININE  --   < > 3.74*  CALCIUM  --   < > 8.8*  MG 2.0  --   --   < > = values in this interval not displayed.  Cardiac Enzymes No results for input(s): TROPONINI in the last 168 hours.  Microbiology Results  @[email or web address] @  RADIOLOGY:  No results found.    Management plans discussed with the patient and He  is in agreement. Stable for discharge with home health  Patient should follow up with Dr. Cherly Hensenhang  CODE STATUS:     Code Status Orders        Start     Ordered   04/26/16 1907  Full code  Continuous     04/26/16 1906    Code Status History    Date Active Date Inactive Code Status  Order ID Comments User Context   This patient has a current code status but no historical code status.      TOTAL TIME TAKING CARE OF THIS PATIENT: 38 minutes.    Note: This dictation was prepared with Dragon dictation along with smaller phrase technology. Any transcriptional errors that result from this process are unintentional.  Lynton Crescenzo M.D on 05/03/2016 at 1:03 PM  Between 7am to 6pm - Pager - [phone] After 6pm go to [email or web address] - password [email or web address]  [facility]  Office  [phone]  CC: Primary care physician; Lauro RegulusANDERSON,MARSHALL W., MD

## 2016-05-03 NOTE — Care Management Important Message (Signed)
Important Message  Patient Details  Name: Eric Yu MRN: [id number] Date of Birth: 12-May-1934   Medicare Important Message Given:  Yes    [name], [name], RN 05/03/2016, 11:44 AM

## 2016-05-03 NOTE — Care Management (Signed)
Faxed information to [facility] and [facility] Wynelle LinkKolluru [facility]  Carollee HerterShannon will contact CM if there are any questions or if did not receive the dialysis orders.

## 2016-05-03 NOTE — Progress Notes (Signed)
Discharge orders, paperwork complete. Waiting for pt's sister to arrive to transport home. Telemetry removed for pt to get dressed.

## 2016-05-04 NOTE — Care Management (Signed)
[phone]

## 2016-05-08 ENCOUNTER — Observation Stay
Admission: EM | Admit: 2016-05-08 | Discharge: 2016-05-11 | Disposition: A | Discharge status: Skilled Nursing Facility | Payer: Medicare HMO | Attending: Internal Medicine | Admitting: Internal Medicine

## 2016-05-08 ENCOUNTER — Emergency Department: Payer: Medicare HMO

## 2016-05-08 ENCOUNTER — Encounter: Payer: Self-pay | Admitting: Emergency Medicine

## 2016-05-08 DIAGNOSIS — E876 Hypokalemia: Secondary | ICD-10-CM | POA: Diagnosis not present

## 2016-05-08 DIAGNOSIS — D631 Anemia in chronic kidney disease: Principal | ICD-10-CM | POA: Insufficient documentation

## 2016-05-08 DIAGNOSIS — J449 Chronic obstructive pulmonary disease, unspecified: Secondary | ICD-10-CM | POA: Diagnosis not present

## 2016-05-08 DIAGNOSIS — N390 Urinary tract infection, site not specified: Secondary | ICD-10-CM | POA: Diagnosis not present

## 2016-05-08 DIAGNOSIS — R5381 Other malaise: Secondary | ICD-10-CM

## 2016-05-08 DIAGNOSIS — Z79899 Other long term (current) drug therapy: Secondary | ICD-10-CM | POA: Insufficient documentation

## 2016-05-08 DIAGNOSIS — M109 Gout, unspecified: Secondary | ICD-10-CM | POA: Diagnosis not present

## 2016-05-08 DIAGNOSIS — D649 Anemia, unspecified: Secondary | ICD-10-CM | POA: Diagnosis present

## 2016-05-08 DIAGNOSIS — Z87891 Personal history of nicotine dependence: Secondary | ICD-10-CM | POA: Insufficient documentation

## 2016-05-08 DIAGNOSIS — N2581 Secondary hyperparathyroidism of renal origin: Secondary | ICD-10-CM | POA: Diagnosis not present

## 2016-05-08 DIAGNOSIS — I120 Hypertensive chronic kidney disease with stage 5 chronic kidney disease or end stage renal disease: Secondary | ICD-10-CM | POA: Diagnosis not present

## 2016-05-08 DIAGNOSIS — R531 Weakness: Secondary | ICD-10-CM | POA: Diagnosis present

## 2016-05-08 DIAGNOSIS — F322 Major depressive disorder, single episode, severe without psychotic features: Secondary | ICD-10-CM | POA: Diagnosis not present

## 2016-05-08 DIAGNOSIS — K5900 Constipation, unspecified: Secondary | ICD-10-CM | POA: Insufficient documentation

## 2016-05-08 DIAGNOSIS — Z9889 Other specified postprocedural states: Secondary | ICD-10-CM | POA: Diagnosis not present

## 2016-05-08 DIAGNOSIS — N186 End stage renal disease: Secondary | ICD-10-CM | POA: Diagnosis not present

## 2016-05-08 DIAGNOSIS — R109 Unspecified abdominal pain: Secondary | ICD-10-CM | POA: Diagnosis present

## 2016-05-08 DIAGNOSIS — Z7982 Long term (current) use of aspirin: Secondary | ICD-10-CM | POA: Diagnosis not present

## 2016-05-08 DIAGNOSIS — Z8249 Family history of ischemic heart disease and other diseases of the circulatory system: Secondary | ICD-10-CM | POA: Diagnosis not present

## 2016-05-08 DIAGNOSIS — Z992 Dependence on renal dialysis: Secondary | ICD-10-CM | POA: Insufficient documentation

## 2016-05-08 DIAGNOSIS — Z515 Encounter for palliative care: Secondary | ICD-10-CM | POA: Diagnosis not present

## 2016-05-08 DIAGNOSIS — Z66 Do not resuscitate: Secondary | ICD-10-CM | POA: Insufficient documentation

## 2016-05-08 HISTORY — DX: Major depressive disorder, single episode, unspecified: F32.9

## 2016-05-08 HISTORY — DX: Depression, unspecified: F32.A

## 2016-05-08 LAB — CBC
HCT: 22.2 % — ABNORMAL LOW (ref 40.0–52.0)
Hemoglobin: 7.2 g/dL — ABNORMAL LOW (ref 13.0–18.0)
MCH: 27.7 pg (ref 26.0–34.0)
MCHC: 32.4 g/dL (ref 32.0–36.0)
MCV: 85.7 fL (ref 80.0–100.0)
PLATELETS: 233 10*3/uL (ref 150–440)
RBC: 2.59 MIL/uL — ABNORMAL LOW (ref 4.40–5.90)
RDW: 16.4 % — AB (ref 11.5–14.5)
WBC: 4.3 10*3/uL (ref 3.8–10.6)

## 2016-05-08 LAB — GLUCOSE, CAPILLARY: GLUCOSE-CAPILLARY: 86 mg/dL (ref 65–99)

## 2016-05-08 LAB — URINALYSIS COMPLETE WITH MICROSCOPIC (ARMC ONLY)
Bilirubin Urine: NEGATIVE
GLUCOSE, UA: NEGATIVE mg/dL
Hgb urine dipstick: NEGATIVE
KETONES UR: NEGATIVE mg/dL
Nitrite: NEGATIVE
PROTEIN: 100 mg/dL — AB
SPECIFIC GRAVITY, URINE: 1.014 (ref 1.005–1.030)
SQUAMOUS EPITHELIAL / LPF: NONE SEEN
pH: 5 (ref 5.0–8.0)

## 2016-05-08 LAB — PREPARE RBC (CROSSMATCH)

## 2016-05-08 LAB — BASIC METABOLIC PANEL
ANION GAP: 9 (ref 5–15)
BUN: 36 mg/dL — ABNORMAL HIGH (ref 6–20)
CALCIUM: 8.2 mg/dL — AB (ref 8.9–10.3)
CO2: 26 mmol/L (ref 22–32)
Chloride: 101 mmol/L (ref 101–111)
Creatinine, Ser: 4.63 mg/dL — ABNORMAL HIGH (ref 0.61–1.24)
GFR, EST AFRICAN AMERICAN: 13 mL/min — AB (ref >60)
GFR, EST NON AFRICAN AMERICAN: 11 mL/min — AB (ref >60)
Glucose, Bld: 89 mg/dL (ref 65–99)
POTASSIUM: 3.6 mmol/L (ref 3.5–5.1)
Sodium: 136 mmol/L (ref 135–145)

## 2016-05-08 LAB — ABO/RH: ABO/RH(D): A POS

## 2016-05-08 MED ORDER — ACETAMINOPHEN 650 MG RE SUPP: 650.0000 mg | Freq: Four times a day (QID) | RECTAL | Status: DC | PRN

## 2016-05-08 MED ORDER — SODIUM CHLORIDE 0.9% FLUSH
3.0000 mL | Freq: Two times a day (BID) | INTRAVENOUS | Status: DC
  Administered 2016-05-08 – 2016-05-11 (×6): 3 mL via INTRAVENOUS

## 2016-05-08 MED ORDER — AMLODIPINE BESYLATE 10 MG PO TABS
10.0000 mg | ORAL_TABLET | ORAL | Status: DC
  Administered 2016-05-09 – 2016-05-11 (×3): 10 mg via ORAL
  Filled 2016-05-08 (×3): qty 1

## 2016-05-08 MED ORDER — ONDANSETRON HCL 4 MG/2ML IJ SOLN: 4.0000 mg | Freq: Four times a day (QID) | INTRAMUSCULAR | Status: DC | PRN

## 2016-05-08 MED ORDER — ACETAMINOPHEN 325 MG PO TABS
650.0000 mg | ORAL_TABLET | Freq: Four times a day (QID) | ORAL | Status: DC | PRN
  Filled 2016-05-08: qty 2

## 2016-05-08 MED ORDER — ACETAMINOPHEN 325 MG PO TABS
650.0000 mg | ORAL_TABLET | Freq: Once | ORAL | Status: AC
  Administered 2016-05-08: 650 mg via ORAL

## 2016-05-08 MED ORDER — SODIUM CHLORIDE 0.9% FLUSH: 3.0000 mL | INTRAVENOUS | Status: DC | PRN

## 2016-05-08 MED ORDER — HEPARIN SODIUM (PORCINE) 5000 UNIT/ML IJ SOLN
5000.0000 [IU] | Freq: Three times a day (TID) | INTRAMUSCULAR | Status: DC
  Administered 2016-05-08 – 2016-05-11 (×8): 5000 [IU] via SUBCUTANEOUS
  Filled 2016-05-08 (×8): qty 1

## 2016-05-08 MED ORDER — CLONIDINE HCL 0.1 MG PO TABS
0.2000 mg | ORAL_TABLET | Freq: Two times a day (BID) | ORAL | Status: DC
  Administered 2016-05-08 – 2016-05-11 (×7): 0.2 mg via ORAL
  Filled 2016-05-08 (×7): qty 2

## 2016-05-08 MED ORDER — AMLODIPINE BESYLATE 5 MG PO TABS: 10.0000 mg | ORAL_TABLET | ORAL | Status: DC

## 2016-05-08 MED ORDER — FLUOXETINE HCL 10 MG PO CAPS
10.0000 mg | ORAL_CAPSULE | ORAL | Status: DC
  Administered 2016-05-10 – 2016-05-11 (×2): 10 mg via ORAL
  Filled 2016-05-08 (×2): qty 1

## 2016-05-08 MED ORDER — FERROUS SULFATE 325 (65 FE) MG PO TABS
325.0000 mg | ORAL_TABLET | ORAL | Status: DC
  Administered 2016-05-09 – 2016-05-11 (×3): 325 mg via ORAL
  Filled 2016-05-08 (×3): qty 1

## 2016-05-08 MED ORDER — NIFEDIPINE ER OSMOTIC RELEASE 60 MG PO TB24
60.0000 mg | ORAL_TABLET | ORAL | Status: DC
  Administered 2016-05-09 – 2016-05-10 (×2): 60 mg via ORAL
  Filled 2016-05-08 (×4): qty 1

## 2016-05-08 MED ORDER — HYDRALAZINE HCL 20 MG/ML IJ SOLN: 10.0000 mg | Freq: Four times a day (QID) | INTRAMUSCULAR | Status: DC | PRN

## 2016-05-08 MED ORDER — SODIUM CHLORIDE 0.9 % IV SOLN: 250.0000 mL | INTRAVENOUS | Status: DC | PRN

## 2016-05-08 MED ORDER — HYDRALAZINE HCL 50 MG PO TABS
100.0000 mg | ORAL_TABLET | Freq: Three times a day (TID) | ORAL | Status: DC
Start: 2016-05-08 — End: 2016-05-12
  Administered 2016-05-08 – 2016-05-11 (×8): 100 mg via ORAL
  Filled 2016-05-08 (×8): qty 2

## 2016-05-08 MED ORDER — GABAPENTIN 100 MG PO CAPS
100.0000 mg | ORAL_CAPSULE | Freq: Every day | ORAL | Status: DC
  Administered 2016-05-08 – 2016-05-11 (×4): 100 mg via ORAL
  Filled 2016-05-08 (×4): qty 1

## 2016-05-08 MED ORDER — LISINOPRIL 20 MG PO TABS
20.0000 mg | ORAL_TABLET | ORAL | Status: DC
  Administered 2016-05-09 – 2016-05-10 (×2): 20 mg via ORAL
  Filled 2016-05-08 (×2): qty 1

## 2016-05-08 MED ORDER — ALBUTEROL SULFATE (2.5 MG/3ML) 0.083% IN NEBU: 2.5000 mg | INHALATION_SOLUTION | RESPIRATORY_TRACT | Status: DC | PRN

## 2016-05-08 MED ORDER — ONDANSETRON HCL 4 MG PO TABS: 4.0000 mg | ORAL_TABLET | Freq: Four times a day (QID) | ORAL | Status: DC | PRN

## 2016-05-08 MED ORDER — DEXTROSE 5 % IV SOLN
1.0000 g | INTRAVENOUS | Status: DC
  Administered 2016-05-09 – 2016-05-11 (×3): 1 g via INTRAVENOUS
  Filled 2016-05-08 (×4): qty 10

## 2016-05-08 MED ORDER — POLYETHYLENE GLYCOL 3350 17 G PO PACK
17.0000 g | PACK | Freq: Every day | ORAL | Status: DC | PRN
  Administered 2016-05-09: 17 g via ORAL
  Filled 2016-05-08: qty 1

## 2016-05-08 MED ORDER — ASPIRIN EC 81 MG PO TBEC
81.0000 mg | DELAYED_RELEASE_TABLET | ORAL | Status: DC
  Administered 2016-05-09 – 2016-05-10 (×2): 81 mg via ORAL
  Filled 2016-05-08 (×2): qty 1

## 2016-05-08 MED ORDER — ALLOPURINOL 100 MG PO TABS
100.0000 mg | ORAL_TABLET | ORAL | Status: DC
  Administered 2016-05-09 – 2016-05-11 (×3): 100 mg via ORAL
  Filled 2016-05-08 (×3): qty 1

## 2016-05-08 MED ORDER — NICOTINE 21 MG/24HR TD PT24
21.0000 mg | MEDICATED_PATCH | Freq: Every day | TRANSDERMAL | Status: DC
  Administered 2016-05-09 – 2016-05-11 (×3): 21 mg via TRANSDERMAL
  Filled 2016-05-08 (×3): qty 1

## 2016-05-08 MED ORDER — SENNA 8.6 MG PO TABS
1.0000 | ORAL_TABLET | Freq: Two times a day (BID) | ORAL | Status: DC
  Administered 2016-05-09 – 2016-05-11 (×6): 8.6 mg via ORAL
  Filled 2016-05-08 (×6): qty 1

## 2016-05-08 MED ORDER — ALBUTEROL SULFATE (2.5 MG/3ML) 0.083% IN NEBU
2.5000 mg | INHALATION_SOLUTION | RESPIRATORY_TRACT | Status: DC
  Administered 2016-05-09 – 2016-05-11 (×3): 2.5 mg via RESPIRATORY_TRACT
  Filled 2016-05-08 (×3): qty 3

## 2016-05-08 MED ORDER — SODIUM CHLORIDE 0.9 % IV SOLN
Freq: Once | INTRAVENOUS | Status: DC
  Administered 2016-05-08: 20:00:00 via INTRAVENOUS

## 2016-05-08 MED ORDER — BISACODYL 10 MG RE SUPP
10.0000 mg | Freq: Every day | RECTAL | Status: DC | PRN
  Administered 2016-05-09: 10 mg via RECTAL
  Filled 2016-05-08 (×2): qty 1

## 2016-05-08 MED ORDER — DEXTROSE 5 % IV SOLN
1.0000 g | Freq: Once | INTRAVENOUS | Status: AC
  Administered 2016-05-08: 1 g via INTRAVENOUS
  Filled 2016-05-08: qty 10

## 2016-05-08 MED ORDER — DOCUSATE SODIUM 100 MG PO CAPS
100.0000 mg | ORAL_CAPSULE | Freq: Two times a day (BID) | ORAL | Status: DC
  Administered 2016-05-09 – 2016-05-11 (×6): 100 mg via ORAL
  Filled 2016-05-08 (×6): qty 1

## 2016-05-08 MED ORDER — ALBUTEROL SULFATE HFA 108 (90 BASE) MCG/ACT IN AERS: 2.0000 | INHALATION_SPRAY | RESPIRATORY_TRACT | Status: DC

## 2016-05-08 NOTE — Progress Notes (Signed)
This note also relates to the following rows which could not be included: SpO2 - Cannot attach notes to unvalidated device data Rate - Cannot attach notes to extension rows Line - Cannot attach notes to extension rows  Transfusion started in HD

## 2016-05-08 NOTE — Progress Notes (Signed)
2nd hd assessment

## 2016-05-08 NOTE — Progress Notes (Signed)
Hd start 

## 2016-05-08 NOTE — ED Notes (Addendum)
Pt states he has not been up ambulating at home much, but when he does ambulate he uses a walker or a cane.  Pt last went to dialysis on Thursday. Pt lives alone, but has family that checks on him frequently.  Pt told MD he didn't want to go to dialysis today since he didn't feel good. Pt recently quick smoking as of 3 weeks ago per family member at bedside.

## 2016-05-08 NOTE — ED Notes (Signed)
MD at bedside. Admitting physician

## 2016-05-08 NOTE — H&P (Signed)
[facility] [place] at [facility]   PATIENT NAME: Eric Yu    MR#:  [id number]  DATE OF BIRTH:  09-28-1933  DATE OF ADMISSION:  05/08/2016  PRIMARY CARE PHYSICIAN: Lauro RegulusANDERSON,MARSHALL W., MD   REQUESTING/REFERRING PHYSICIAN: Dr. Roxan Hockeyobinson  CHIEF COMPLAINT:   Chief Complaint  Patient presents with  . Abdominal Pain  . Weakness    HISTORY OF PRESENT ILLNESS:  Eric HalsJohn Hainline  is a 82 y.o. male with a known history of Hypertension, recently diagnosed end stage renal disease on hemodialysis, anemia of chronic disease presents to the hospital with worsening weakness, constipation and dizziness. Since discharge from the hospital 6 days back patient has barely been able to get out of bed. Feels dizzy on standing up. No bowel movements. Some nausea but no vomiting. No fever. No pain. Mild cough.  In the emergency room patient has been noticed to have worsening anemia with hemoglobin 7.2, UTI and is being admitted to the hospitalist service.  He was supposed to get dialysis today but had to present to the emergency room due to severe weakness.  PAST MEDICAL HISTORY:   Past Medical History:  Diagnosis Date  . Chronic kidney disease   . Depression   . Gout   . Hypertension   . Iron deficiency     PAST SURGICAL HISTORY:   Past Surgical History:  Procedure Laterality Date  . NO PAST SURGERIES    . PERIPHERAL VASCULAR CATHETERIZATION N/A 04/26/2016   Procedure: Dialysis/Perma Catheter Insertion;  Surgeon: Annice NeedyJason S Dew, MD;  Location: [facility] INVASIVE CV LAB;  Service: Cardiovascular;  Laterality: N/A;  . THYROIDECTOMY      SOCIAL HISTORY:   Social History  Substance Use Topics  . Smoking status: Former Smoker    Quit date: 04/16/2016  . Smokeless tobacco: Never Used     Comment: pt is currently using nicotine patches  . Alcohol use No    FAMILY HISTORY:   Family History  Problem Relation Age of Onset  . Cancer Sister   . Hypertension Sister   .  Urolithiasis Neg Hx   . Prostate cancer Neg Hx   . Kidney disease Neg Hx   . Kidney cancer Neg Hx     DRUG ALLERGIES:   Allergies  Allergen Reactions  . Cephalexin     Other reaction(s): Other (See Comments) Gross hematuria - occurred once. Not certain whether it was coincidence or causative.    REVIEW OF SYSTEMS:   Review of Systems  Constitutional: Positive for malaise/fatigue and weight loss. Negative for chills and fever.  HENT: Negative for sore throat.   Eyes: Negative for blurred vision, double vision and pain.  Respiratory: Positive for cough. Negative for hemoptysis, shortness of breath and wheezing.   Cardiovascular: Negative for chest pain, palpitations, orthopnea and leg swelling.  Gastrointestinal: Positive for constipation. Negative for abdominal pain, diarrhea, heartburn, nausea and vomiting.  Genitourinary: Negative for dysuria and hematuria.  Musculoskeletal: Negative for back pain and joint pain.  Skin: Negative for rash.  Neurological: Positive for dizziness and weakness. Negative for sensory change, speech change, focal weakness and headaches.  Endo/Heme/Allergies: Does not bruise/bleed easily.  Psychiatric/Behavioral: Negative for depression. The patient is not nervous/anxious.     MEDICATIONS AT HOME:   Prior to Admission medications   Medication Sig Start Date End Date Taking? Authorizing Provider  acetaminophen (TYLENOL) 325 MG tablet Take 325 mg by mouth every morning.  01/10/15  Yes Historical Provider, MD  albuterol (PROVENTIL  HFA;VENTOLIN HFA) 108 (90 Base) MCG/ACT inhaler Inhale 2 puffs into the lungs every morning.    Yes Historical Provider, MD  allopurinol (ZYLOPRIM) 100 MG tablet Take 100 mg by mouth every morning.    Yes Historical Provider, MD  amLODipine (NORVASC) 10 MG tablet Take 10 mg by mouth every morning.    Yes Historical Provider, MD  aspirin EC 81 MG tablet Take 81 mg by mouth every morning.    Yes Historical Provider, MD   cholecalciferol (VITAMIN D) 1000 units tablet Take 1,000 Units by mouth every morning.    Yes Historical Provider, MD  cloNIDine (CATAPRES) 0.2 MG tablet Take 0.2 mg by mouth 2 (two) times daily.  04/09/15  Yes Historical Provider, MD  ferrous sulfate 325 (65 FE) MG tablet Take 325 mg by mouth every morning.    Yes Historical Provider, MD  FLUoxetine (PROZAC) 10 MG capsule Take 1 capsule (10 mg total) by mouth daily. Patient taking differently: Take 10 mg by mouth every morning.  05/01/16  Yes Enid Baas, MD  gabapentin (NEURONTIN) 100 MG capsule Take 1 capsule (100 mg total) by mouth at bedtime. 05/03/16  Yes Adrian Saran, MD  hydrALAZINE (APRESOLINE) 100 MG tablet Take 100 mg by mouth 3 (three) times daily.   Yes Historical Provider, MD  lisinopril (PRINIVIL,ZESTRIL) 20 MG tablet Take 20 mg by mouth every morning.  07/04/14  Yes Historical Provider, MD  nicotine (NICODERM CQ - DOSED IN MG/24 HOURS) 21 mg/24hr patch Place 21 mg onto the skin daily.   Yes Historical Provider, MD  NIFEdipine (PROCARDIA-XL/ADALAT CC) 60 MG 24 hr tablet Take 60 mg by mouth every morning.  12/19/13  Yes Historical Provider, MD     VITAL SIGNS:  Blood pressure (!) 151/80, pulse 68, temperature 99 F (37.2 C), temperature source Oral, resp. rate 18, height 5\' 8"  (1.727 m), weight 76.3 kg (168 lb 4.8 oz), SpO2 100 %.  PHYSICAL EXAMINATION:  Physical Exam  GENERAL:  82 y.o.-year-old patient lying in the bed with no acute distress.  EYES: Pupils equal, round, reactive to light and accommodation. No scleral icterus. Extraocular muscles intact.  HEENT: Head atraumatic, normocephalic. Oropharynx and nasopharynx clear. No oropharyngeal erythema, dry oral mucosa. Pallor positive NECK:  Supple, no jugular venous distention. No thyroid enlargement, no tenderness.  LUNGS: Normal breath sounds bilaterally, no wheezing, rales, rhonchi. No use of accessory muscles of respiration.  CARDIOVASCULAR: S1, S2 normal. No murmurs,  rubs, or gallops.  ABDOMEN: Soft, nontender, nondistended. Bowel sounds present. No organomegaly or mass.  EXTREMITIES: No pedal edema, cyanosis, or clubbing. + 2 pedal & radial pulses b/l.   NEUROLOGIC: Cranial nerves II through XII are intact. Motor strength 5/5 in upper extremities and 4/5 in lower extremities. Symmetrical. PSYCHIATRIC: The patient is alert and oriented x 3. Good affect.  SKIN: No obvious rash, lesion, or ulcer.   LABORATORY PANEL:   CBC  Recent Labs Lab 05/08/16 1206  WBC 4.3  HGB 7.2*  HCT 22.2*  PLT 233   ------------------------------------------------------------------------------------------------------------------  Chemistries   Recent Labs Lab 05/01/16 2253  05/08/16 1206  NA  --   < > 136  K  --   < > 3.6  CL  --   < > 101  CO2  --   < > 26  GLUCOSE  --   < > 89  BUN  --   < > 36*  CREATININE  --   < > 4.63*  CALCIUM  --   < >  8.2*  MG 2.0  --   --   < > = values in this interval not displayed. ------------------------------------------------------------------------------------------------------------------  Cardiac Enzymes No results for input(s): TROPONINI in the last 168 hours. ------------------------------------------------------------------------------------------------------------------  RADIOLOGY:  Dg Chest 1 View  Result Date: 05/08/2016 CLINICAL DATA:  Recent discharge from hospital (perma cath placement). Now with generalized weakness and cough. Denies fever or lung disease EXAM: CHEST 1 VIEW COMPARISON:  04/30/2015 FINDINGS: RIGHT central venous line with tip in distal SVC. No pneumothorax. Normal heart cardiac silhouette. No pulmonary edema no pneumothorax. IMPRESSION: No acute cardiopulmonary process. Electronically Signed   By: Genevive Bi M.D.   On: 05/08/2016 13:49     IMPRESSION AND PLAN:   * Acutely worsening chronic anemia Stool for occult blood in the emergency room was negative We'll transfuse 1 unit packed  RBC due to symptomatic anemia with severe weakness and dizziness. Repeat labs in the morning.  * UTI Start ceftriaxone. Wait for urine cultures.  * End-stage renal disease on hemodialysis Consult nephrology for further dialysis needs. No signs of fluid overload at this time.  * Constipation We'll start patient on stool softeners and laxatives. Suppository ordered when necessary if no bowel movement.  * Depression Continue home medications  * DVT prophylaxis with subcutaneous heparin  All the records are reviewed and case discussed with ED provider. Management plans discussed with the patient, family and they are in agreement.  CODE STATUS: Full code  TOTAL TIME TAKING CARE OF THIS PATIENT: D5 minutes.   Milagros Loll R M.D on 05/08/2016 at 2:51 PM  Between 7am to 6pm - Pager - [phone]  After 6pm go to [email or web address] - password [email or web address]  [facility]  Office  [phone]  CC: Primary care physician; Lauro Regulus., MD  Note: This dictation was prepared with Dragon dictation along with smaller phrase technology. Any transcriptional errors that result from this process are unintentional.

## 2016-05-08 NOTE — Progress Notes (Signed)
Pre hd assessment  

## 2016-05-08 NOTE — Care Management Obs Status (Signed)
MEDICARE OBSERVATION STATUS NOTIFICATION   Patient Details  Name: Eric Yu MRN: [id number] Date of Birth: 28-Jun-1934   Medicare Observation Status Notification Given:  Yes    Caren MacadamMichelle Madix Blowe, RN 05/08/2016, 6:31 PM

## 2016-05-08 NOTE — Progress Notes (Signed)
This note also relates to the following rows which could not be included: Pulse Rate - Cannot attach notes to unvalidated device data SpO2 - Cannot attach notes to unvalidated device data  PRE HD INFO

## 2016-05-08 NOTE — ED Notes (Signed)
MD at bedside. 

## 2016-05-08 NOTE — Progress Notes (Signed)
HD COMPLETED  

## 2016-05-08 NOTE — ED Provider Notes (Addendum)
[facility] Emergency Department Provider Note  ____________________________________________   First MD Initiated Contact with Patient 05/08/16 1219     (approximate)  I have reviewed the triage vital signs and the nursing notes.   HISTORY  Chief Complaint Abdominal Pain and Weakness   HPI Eric Yu is a 82 y.o. male with a recent history of permacath placement is coming in to the emergency department today for weakness as well as abdominal cramping and constipation. The patient says that he has also been having a cough. Says that his weakness is generalized. Says the abdominal pain is mild. Does not report any radiation of the abdominal pain. Is not able to localize the abdominal pain.Says that his symptoms started a week ago after he was discharged from the hospital. Denies any blood in stool. His that he has not had a bowel movement in about one week.   Past Medical History:  Diagnosis Date  . Chronic kidney disease   . Depression   . Gout   . Hypertension   . Iron deficiency     Patient Active Problem List   Diagnosis Date Noted  . Severe major depression, single episode (HCC) 04/28/2016  . Acute renal failure (ARF) (HCC) 04/26/2016  . Injury of kidney 01/08/2015  . Blood loss, postoperative 01/06/2015  . Hypertensive left ventricular hypertrophy 12/26/2014  . Chronic kidney disease, stage IV (severe) (HCC) 12/12/2014  . Chronic kidney disease (CKD), stage III (moderate) 01/16/2014  . Gout 01/16/2014  . Benign hypertension 05/19/2012  . Current tobacco use 05/19/2012    Past Surgical History:  Procedure Laterality Date  . NO PAST SURGERIES    . PERIPHERAL VASCULAR CATHETERIZATION N/A 04/26/2016   Procedure: Dialysis/Perma Catheter Insertion;  Surgeon: Annice Needy, MD;  Location: [facility] INVASIVE CV LAB;  Service: Cardiovascular;  Laterality: N/A;  . THYROIDECTOMY      Prior to Admission medications   Medication Sig Start Date End Date  Taking? Authorizing Provider  acetaminophen (TYLENOL) 325 MG tablet Take 325 mg by mouth daily.  01/10/15   Historical Provider, MD  albuterol (PROVENTIL HFA;VENTOLIN HFA) 108 (90 Base) MCG/ACT inhaler Inhale 2 puffs into the lungs daily.    Historical Provider, MD  allopurinol (ZYLOPRIM) 100 MG tablet Take 100 mg by mouth daily.     Historical Provider, MD  amLODipine (NORVASC) 10 MG tablet Take 10 mg by mouth daily.    Historical Provider, MD  aspirin EC 81 MG tablet Take 81 mg by mouth daily.    Historical Provider, MD  cholecalciferol (VITAMIN D) 1000 units tablet Take 1,000 Units by mouth daily.    Historical Provider, MD  cloNIDine (CATAPRES) 0.2 MG tablet Take 0.2 mg by mouth 2 (two) times daily.  04/09/15   Historical Provider, MD  ferrous sulfate 325 (65 FE) MG tablet Take 325 mg by mouth daily.     Historical Provider, MD  FLUoxetine (PROZAC) 10 MG capsule Take 1 capsule (10 mg total) by mouth daily. 05/01/16   Enid Baas, MD  gabapentin (NEURONTIN) 100 MG capsule Take 1 capsule (100 mg total) by mouth at bedtime. 05/03/16   Adrian Saran, MD  hydrALAZINE (APRESOLINE) 100 MG tablet Take 100 mg by mouth 3 (three) times daily.    Historical Provider, MD  lisinopril (PRINIVIL,ZESTRIL) 20 MG tablet Take 20 mg by mouth. 07/04/14   Historical Provider, MD  nicotine (NICODERM CQ - DOSED IN MG/24 HOURS) 21 mg/24hr patch Place 21 mg onto the skin daily.  Historical Provider, MD  NIFEdipine (PROCARDIA-XL/ADALAT CC) 60 MG 24 hr tablet  12/19/13   Historical Provider, MD    Allergies Review of patient's allergies indicates no known allergies.  Family History  Problem Relation Age of Onset  . Cancer Sister   . Hypertension Sister   . Urolithiasis Neg Hx   . Prostate cancer Neg Hx   . Kidney disease Neg Hx   . Kidney cancer Neg Hx     Social History Social History  Substance Use Topics  . Smoking status: Former Smoker    Quit date: 04/16/2016  . Smokeless tobacco: Never Used      Comment: pt is currently using nicotine patches  . Alcohol use No    Review of Systems Constitutional: No fever/chills Eyes: No visual changes. ENT: No sore throat. Cardiovascular: Denies chest pain. Respiratory: As above Gastrointestinal:   No nausea, no vomiting.  No diarrhea.   Genitourinary: Negative for dysuria. Musculoskeletal: Negative for back pain. Skin: Negative for rash. Neurological: Negative for headaches, focal weakness or numbness.  10-point ROS otherwise negative.  ____________________________________________   PHYSICAL EXAM:  VITAL SIGNS: ED Triage Vitals  Enc Vitals Group     BP 05/08/16 1156 (!) 149/63     Pulse Rate 05/08/16 1156 69     Resp 05/08/16 1156 12     Temp 05/08/16 1156 99 F (37.2 C)     Temp Source 05/08/16 1156 Oral     SpO2 05/08/16 1156 99 %     Weight 05/08/16 1157 168 lb 4.8 oz (76.3 kg)     Height 05/08/16 1157 5\' 8"  (1.727 m)     Head Circumference --      Peak Flow --      Pain Score --      Pain Loc --      Pain Edu? --      Excl. in GC? --     Constitutional: Alert and oriented. Ill-appearing. Slit respond but alert and oriented and giving appropriate answers. Eyes: Conjunctivae are normal. PERRL. EOMI. Head: Atraumatic. Nose: No congestion/rhinnorhea. Mouth/Throat: Mucous membranes are moist.   Neck: No stridor.   Cardiovascular: Normal rate, regular rhythm. Grossly normal heart sounds.  Respiratory: Normal respiratory effort.  No retractions. Lungs CTAB. Gastrointestinal: Soft and nontender. No distention. Rectal exam with heme negative brown stool. Musculoskeletal: No lower extremity tenderness nor edema.  No joint effusions. Neurologic:  Normal speech and language. No gross focal neurologic deficits are appreciated.  Skin:  Skin is warm, dry and intact. No rash noted. Psychiatric: Mood and affect are normal. Speech and behavior are normal.  ____________________________________________   LABS (all labs ordered  are listed, but only abnormal results are displayed)  Labs Reviewed  BASIC METABOLIC PANEL - Abnormal; Notable for the following:       Result Value   BUN 36 (*)    Creatinine, Ser 4.63 (*)    Calcium 8.2 (*)    GFR calc non Af Amer 11 (*)    GFR calc Af Amer 13 (*)    All other components within normal limits  CBC - Abnormal; Notable for the following:    RBC 2.59 (*)    Hemoglobin 7.2 (*)    HCT 22.2 (*)    RDW 16.4 (*)    All other components within normal limits  URINALYSIS COMPLETEWITH MICROSCOPIC ([facility] ONLY) - Abnormal; Notable for the following:    Color, Urine YELLOW (*)    APPearance HAZY (*)  Protein, ur 100 (*)    Leukocytes, UA TRACE (*)    Bacteria, UA RARE (*)    All other components within normal limits  CULTURE, BLOOD (ROUTINE X 2)  CULTURE, BLOOD (ROUTINE X 2)  GLUCOSE, CAPILLARY  CBG MONITORING, ED   ____________________________________________  EKG  ED ECG REPORT I, Lamarr Feenstra,  Teena Irani, the attending physician, personally viewed and interpreted this ECG.   Date: 05/08/2016  EKG Time: 1156  Rate: 68  Rhythm: Sinus rhythm  Axis: Normal axis  Intervals:Prolonged PR interval.  ST&T Change: T wave inversions in 1 and aVL. No ST elevation or depression.  ____________________________________________  RADIOLOGY  Pending chest x-ray ____________________________________________   PROCEDURES  Procedure(s) performed:  Procedures  Critical Care performed:   ____________________________________________   INITIAL IMPRESSION / ASSESSMENT AND PLAN / ED COURSE  Pertinent labs & imaging results that were available during my care of the patient were reviewed by me and considered in my medical decision making (see chart for details).  ----------------------------------------- 1:35 PM on 05/08/2016 -----------------------------------------  Patient with over 2. hemoglobin drop since August 14. Also with mild urinary tract infection. We'll also  check blood cultures as well as chest x-ray. Very benign abdominal exam. Normal white blood cell count.  We'll admit to the hospital for serial abdominal exams as well as blood transfusion. Signed out to Dr. Elpidio Anis. Explained the diagnoses will plan to the patient as well as his sister-in-law who are understanding and willing to comply.  Clinical Course     ____________________________________________   FINAL CLINICAL IMPRESSION(S) / ED DIAGNOSES  Symptomatic anemia. Generalized abdominal pain. UTI. Weakness.    NEW MEDICATIONS STARTED DURING THIS VISIT:  New Prescriptions   No medications on file     Note:  This document was prepared using Dragon voice recognition software and may include unintentional dictation errors.    Myrna Blazer, MD 05/08/16 1336  No hard fecal impaction found on rectal exam.   Myrna Blazer, MD 05/08/16 [phone]

## 2016-05-08 NOTE — Care Management (Signed)
Chaplain checked on patient to see how he was doing. Patient is receiving dialysis but is in decent spirits. Chaplain will continue rounds and follow up at a later time.

## 2016-05-08 NOTE — ED Triage Notes (Signed)
Pt arrived via EMS to ED with generalized weakness and c/o abdominal pain and no BM in at least a week.  [facility].  Pt is due for dialysis TTS and did not receive dialysis today. Pt is warm to the touch and states he hurts "all over"

## 2016-05-08 NOTE — Progress Notes (Signed)
This note also relates to the following rows which could not be included: ECG Heart Rate - Cannot attach notes to unvalidated device data  Transfusion ended

## 2016-05-08 NOTE — Progress Notes (Signed)
POST DIALYSIS ASSESSMENT 

## 2016-05-08 NOTE — ED Notes (Signed)
Family at bedside. 

## 2016-05-08 NOTE — Progress Notes (Signed)
[facility]  ROUNDING NOTE   Subjective:   Sister went to patient's house this morning to take him for dialysis. Patient was too weak to get out of bed. He stated he did not want to continue dialysis and was tired. He complained of abdominal pain, constipation and dizzyness. Sister and brother in law at bedside.   Found to have a hemoglobin of 7.2. Admitted to [facility]. Scheduled for dialysis and PRBC blood transfusion   Objective:  Vital signs in last 24 hours:  Temp:  [99 F (37.2 C)] 99 F (37.2 C) (08/26 1156) Pulse Rate:  [66-81] 72 (08/26 1514) Resp:  [12-18] 18 (08/26 1514) BP: (136-173)/(63-80) 173/70 (08/26 1514) SpO2:  [97 %-100 %] 100 % (08/26 1514) Weight:  [76.3 kg (168 lb 4.8 oz)] 76.3 kg (168 lb 4.8 oz) (08/26 1157)  Weight change:  Filed Weights   05/08/16 1157  Weight: 76.3 kg (168 lb 4.8 oz)    Intake/Output: No intake/output data recorded.   Intake/Output this shift:  No intake/output data recorded.  Physical Exam: General: No acute distress  Head: Normocephalic, atraumatic. Moist oral mucosal membranes  Eyes: Anicteric  Neck: Supple, trachea midline  Lungs:  Clear to auscultation, normal effort  Heart: S1S2 no rubs  Abdomen:  Soft, nontender, Bowel sounds present   Extremities: No peripheral edema.  Neurologic: Nonfocal, moving all four extremities  Skin: No lesions  Access: R IJ permcath 04/26/16    Basic Metabolic Panel:  Recent Labs Lab 05/01/16 2253 05/02/16 0549 05/03/16 0520 05/08/16 1206  NA  --  137 136 136  K  --  3.2* 3.7 3.6  CL  --  100* 100* 101  CO2  --  29 27 26   GLUCOSE  --  85 91 89  BUN  --  15 33* 36*  CREATININE  --  2.70* 3.74* 4.63*  CALCIUM  --  8.7* 8.8* 8.2*  MG 2.0  --   --   --     Liver Function Tests: No results for input(s): AST, ALT, ALKPHOS, BILITOT, PROT, ALBUMIN in the last 168 hours. No results for input(s): LIPASE, AMYLASE in the last 168 hours. No results for input(s): AMMONIA in the  last 168 hours.  CBC:  Recent Labs Lab 05/08/16 1206  WBC 4.3  HGB 7.2*  HCT 22.2*  MCV 85.7  PLT 233    Cardiac Enzymes: No results for input(s): CKTOTAL, CKMB, CKMBINDEX, TROPONINI in the last 168 hours.  BNP: Invalid input(s): POCBNP  CBG:  Recent Labs Lab 05/08/16 1210  GLUCAP 86    Microbiology: Results for orders placed or performed during the hospital encounter of 04/26/16  MRSA PCR Screening     Status: None   Collection Time: 04/26/16  7:05 PM  Result Value Ref Range Status   MRSA by PCR NEGATIVE NEGATIVE Final    Comment:        The GeneXpert MRSA Assay (FDA approved for NASAL specimens only), is one component of a comprehensive MRSA colonization surveillance program. It is not intended to diagnose MRSA infection nor to guide or monitor treatment for MRSA infections.     Coagulation Studies: No results for input(s): LABPROT, INR in the last 72 hours.  Urinalysis:  Recent Labs  05/08/16 1255  COLORURINE YELLOW*  LABSPEC 1.014  PHURINE 5.0  GLUCOSEU NEGATIVE  HGBUR NEGATIVE  BILIRUBINUR NEGATIVE  KETONESUR NEGATIVE  PROTEINUR 100*  NITRITE NEGATIVE  LEUKOCYTESUR TRACE*      Imaging: Dg Chest  1 View  Result Date: 05/08/2016 CLINICAL DATA:  Recent discharge from hospital (perma cath placement). Now with generalized weakness and cough. Denies fever or lung disease EXAM: CHEST 1 VIEW COMPARISON:  04/30/2015 FINDINGS: RIGHT central venous line with tip in distal SVC. No pneumothorax. Normal heart cardiac silhouette. No pulmonary edema no pneumothorax. IMPRESSION: No acute cardiopulmonary process. Electronically Signed   By: Genevive BiStewart  Edmunds M.D.   On: 05/08/2016 13:49     Medications:     . sodium chloride   Intravenous Once  . acetaminophen  650 mg Oral Once  . [START ON 05/09/2016] albuterol  2.5 mg Nebulization BH-q7a  . allopurinol  100 mg Oral BH-q7a  . amLODipine  10 mg Oral BH-q7a  . [START ON 05/09/2016] aspirin EC  81 mg  Oral BH-q7a  . [START ON 05/09/2016] cefTRIAXone (ROCEPHIN)  IV  1 g Intravenous Q24H  . cloNIDine  0.2 mg Oral BID  . docusate sodium  100 mg Oral BID  . ferrous sulfate  325 mg Oral BH-q7a  . [START ON 05/09/2016] FLUoxetine  10 mg Oral BH-q7a  . gabapentin  100 mg Oral QHS  . heparin  5,000 Units Subcutaneous Q8H  . hydrALAZINE  100 mg Oral TID  . lisinopril  20 mg Oral BH-q7a  . nicotine  21 mg Transdermal Daily  . NIFEdipine  60 mg Oral BH-q7a  . senna  1 tablet Oral BID  . sodium chloride flush  3 mL Intravenous Q12H   sodium chloride, acetaminophen **OR** acetaminophen, albuterol, bisacodyl, hydrALAZINE, ondansetron **OR** ondansetron (ZOFRAN) IV, polyethylene glycol, sodium chloride flush  Assessment/ Plan:  82 y.o.black male with hypertension, gout, anemia, depression, COPD and tobacco abuse   [facility]   1. End-stage renal disease [facility]. Started on dialysis on 8/14.  - Orders prepared for dialysis today. Patient has many comorbidities is unsure how long he would like to continue dialysis in the long term. Discussed with him and sister.   2. Anemia of chronic kidney disease: with iron deficiency - epo with hemodialysis treatment - PRBC 1 unit ordered  - Consult gastroenterology  3. Secondary hyperparathyroidism: PTH 310, phos and calcium at goal - not on any binders or vitamin D agents.   4. Hypertension: elevated - Home regimen of hydralazine, lisinopril, nifedipine, and amlodipine.   LOS: 0 Malekai Markwood 8/26/20174:21 PM

## 2016-05-09 LAB — COMPREHENSIVE METABOLIC PANEL
ALBUMIN: 2.8 g/dL — AB (ref 3.5–5.0)
ALT: 8 U/L — AB (ref 17–63)
AST: 17 U/L (ref 15–41)
Alkaline Phosphatase: 78 U/L (ref 38–126)
Anion gap: 7 (ref 5–15)
BILIRUBIN TOTAL: 0.5 mg/dL (ref 0.3–1.2)
BUN: 20 mg/dL (ref 6–20)
CALCIUM: 8.2 mg/dL — AB (ref 8.9–10.3)
CHLORIDE: 101 mmol/L (ref 101–111)
CO2: 30 mmol/L (ref 22–32)
CREATININE: 2.99 mg/dL — AB (ref 0.61–1.24)
GFR, EST AFRICAN AMERICAN: 21 mL/min — AB (ref >60)
GFR, EST NON AFRICAN AMERICAN: 18 mL/min — AB (ref >60)
Glucose, Bld: 115 mg/dL — ABNORMAL HIGH (ref 65–99)
Potassium: 3.3 mmol/L — ABNORMAL LOW (ref 3.5–5.1)
Sodium: 138 mmol/L (ref 135–145)
Total Protein: 6.3 g/dL — ABNORMAL LOW (ref 6.5–8.1)

## 2016-05-09 LAB — URINE CULTURE: Culture: <10000 — AB

## 2016-05-09 LAB — TYPE AND SCREEN
ABO/RH(D): A POS
Antibody Screen: NEGATIVE
UNIT DIVISION: 0

## 2016-05-09 LAB — HEMOGLOBIN AND HEMATOCRIT, BLOOD
HEMATOCRIT: 26.6 % — AB (ref 40.0–52.0)
Hemoglobin: 9.1 g/dL — ABNORMAL LOW (ref 13.0–18.0)

## 2016-05-09 MED ORDER — POTASSIUM CHLORIDE CRYS ER 20 MEQ PO TBCR
20.0000 meq | EXTENDED_RELEASE_TABLET | Freq: Once | ORAL | Status: AC
  Administered 2016-05-09: 20 meq via ORAL
  Filled 2016-05-09: qty 1

## 2016-05-09 NOTE — Progress Notes (Signed)
PT Cancellation Note  Patient Details Name: Lorra HalsJohn Rhee MRN: [id number] DOB: 12-23-1933   Cancelled Treatment:     Pt refused PT today stating "My bowels are locked up."  He is too uncomfortable and feeling to weak to be able to participate.  Will try back tomorrow.   Malachi ProGalen R Campbell Agramonte, DPT 05/09/2016, 2:26 PM

## 2016-05-09 NOTE — Progress Notes (Signed)
[name]  ROUNDING NOTE   Subjective:   More alert and awake this morning.  Eating breakfast.  Hemodialysis yesterday. Tolerated treatment well. UF of 1.4 litres.   Objective:  Vital signs in last 24 hours:  Temp:  [97.9 F (36.6 C)-99 F (37.2 C)] 98.5 F (36.9 C) (08/27 0437) Pulse Rate:  [47-97] 77 (08/27 0437) Resp:  [12-22] 18 (08/27 0437) BP: (136-183)/(63-94) 157/67 (08/27 0437) SpO2:  [97 %-100 %] 98 % (08/27 0732) Weight:  [67.5 kg (148 lb 12.8 oz)-76.3 kg (168 lb 4.8 oz)] 67.5 kg (148 lb 12.8 oz) (08/27 0500)  Weight change:  Filed Weights   05/08/16 1157 05/08/16 1825 05/09/16 0500  Weight: 76.3 kg (168 lb 4.8 oz) 69.3 kg (152 lb 12.5 oz) 67.5 kg (148 lb 12.8 oz)    Intake/Output: I/O last 3 completed shifts: In: 320 [Blood:320] Out: 1400 [Other:1400]   Intake/Output this shift:  No intake/output data recorded.  Physical Exam: General: No acute distress  Head: Normocephalic, atraumatic. Moist oral mucosal membranes  Eyes: Anicteric  Neck: Supple, trachea midline  Lungs:  Clear to auscultation, normal effort  Heart: S1S2 no rubs  Abdomen:  Soft, nontender, Bowel sounds present   Extremities: No peripheral edema.  Neurologic: Nonfocal, moving all four extremities  Skin: No lesions  Access: R IJ permcath 04/26/16    Basic Metabolic Panel:  Recent Labs Lab 05/03/16 0520 05/08/16 1206 05/09/16 0050  NA 136 136 138  K 3.7 3.6 3.3*  CL 100* 101 101  CO2 27 26 30   GLUCOSE 91 89 115*  BUN 33* 36* 20  CREATININE 3.74* 4.63* 2.99*  CALCIUM 8.8* 8.2* 8.2*    Liver Function Tests:  Recent Labs Lab 05/09/16 0050  AST 17  ALT 8*  ALKPHOS 78  BILITOT 0.5  PROT 6.3*  ALBUMIN 2.8*   No results for input(s): LIPASE, AMYLASE in the last 168 hours. No results for input(s): AMMONIA in the last 168 hours.  CBC:  Recent Labs Lab 05/08/16 1206 05/09/16 0050  WBC 4.3  --   HGB 7.2* 9.1*  HCT 22.2* 26.6*  MCV 85.7  --   PLT  233  --     Cardiac Enzymes: No results for input(s): CKTOTAL, CKMB, CKMBINDEX, TROPONINI in the last 168 hours.  BNP: Invalid input(s): POCBNP  CBG:  Recent Labs Lab 05/08/16 1210  GLUCAP 86    Microbiology: Results for orders placed or performed during the hospital encounter of 05/08/16  Blood culture (routine x 2)     Status: None (Preliminary result)   Collection Time: 05/08/16  1:50 PM  Result Value Ref Range Status   Specimen Description BLOOD RIGHT ASSIST CONTROL  Final   Special Requests BOTTLES DRAWN AEROBIC AND ANAEROBIC 7CCAERO,5CCANA  Final   Culture NO GROWTH < 24 HOURS  Final   Report Status PENDING  Incomplete  Blood culture (routine x 2)     Status: None (Preliminary result)   Collection Time: 05/08/16  2:00 PM  Result Value Ref Range Status   Specimen Description BLOOD  Final   Special Requests BOTTLES DRAWN AEROBIC AND ANAEROBIC 1CCAERO,1CCANA  Final   Culture NO GROWTH < 24 HOURS  Final   Report Status PENDING  Incomplete    Coagulation Studies: No results for input(s): LABPROT, INR in the last 72 hours.  Urinalysis:  Recent Labs  05/08/16 1255  COLORURINE YELLOW*  LABSPEC 1.014  PHURINE 5.0  GLUCOSEU NEGATIVE  HGBUR NEGATIVE  BILIRUBINUR NEGATIVE  KETONESUR NEGATIVE  PROTEINUR 100*  NITRITE NEGATIVE  LEUKOCYTESUR TRACE*      Imaging: Dg Chest 1 View  Result Date: 05/08/2016 CLINICAL DATA:  Recent discharge from hospital (perma cath placement). Now with generalized weakness and cough. Denies fever or lung disease EXAM: CHEST 1 VIEW COMPARISON:  04/30/2015 FINDINGS: RIGHT central venous line with tip in distal SVC. No pneumothorax. Normal heart cardiac silhouette. No pulmonary edema no pneumothorax. IMPRESSION: No acute cardiopulmonary process. Electronically Signed   By: Genevive Bi M.D.   On: 05/08/2016 13:49     Medications:     . sodium chloride   Intravenous Once  . albuterol  2.5 mg Nebulization BH-q7a  . allopurinol   100 mg Oral BH-q7a  . amLODipine  10 mg Oral BH-q7a  . aspirin EC  81 mg Oral BH-q7a  . cefTRIAXone (ROCEPHIN)  IV  1 g Intravenous Q24H  . cloNIDine  0.2 mg Oral BID  . docusate sodium  100 mg Oral BID  . ferrous sulfate  325 mg Oral BH-q7a  . FLUoxetine  10 mg Oral BH-q7a  . gabapentin  100 mg Oral QHS  . heparin  5,000 Units Subcutaneous Q8H  . hydrALAZINE  100 mg Oral TID  . lisinopril  20 mg Oral BH-q7a  . nicotine  21 mg Transdermal Daily  . NIFEdipine  60 mg Oral BH-q7a  . senna  1 tablet Oral BID  . sodium chloride flush  3 mL Intravenous Q12H   sodium chloride, acetaminophen **OR** acetaminophen, albuterol, bisacodyl, hydrALAZINE, ondansetron **OR** ondansetron (ZOFRAN) IV, polyethylene glycol, sodium chloride flush  Assessment/ Plan:  82 y.o.black male with hypertension, gout, anemia, depression, COPD and tobacco abuse   TTS [facility] R[facility] permcath [facility].   1. End-stage renal disease followed by [facility] nephrology. Started on dialysis on 8/14. Tolerated dialysis yesterday. UF of 1.4 litres. Continue TTS schedule.  2. Anemia of chronic kidney disease: with iron deficiency. PRBC transfusion yesterday 8/26. - epo with hemodialysis treatment - Consult gastroenterology  3. Secondary hyperparathyroidism: PTH 310, phos and calcium at goal - not on any binders or vitamin D agents.   4. Hypertension: elevated - Home regimen of hydralazine, lisinopril, nifedipine, and amlodipine.   LOS: 0 Barbar Brede 8/27/201710:08 AM

## 2016-05-09 NOTE — Progress Notes (Signed)
[facility] - [place] at [facility]   PATIENT NAME: Eric Yu    MR#:  [id number]  DATE OF BIRTH:  06/17/1934  SUBJECTIVE:  CHIEF COMPLAINT:   Chief Complaint  Patient presents with  . Abdominal Pain  . Weakness   - recent discharge, initiated on hemodialysis last week - complains of constipation and weakness - dialysis and blood transfusion given after admission yesterday  REVIEW OF SYSTEMS:  Review of Systems  Constitutional: Positive for malaise/fatigue. Negative for chills and fever.  HENT: Negative for ear discharge, ear pain and nosebleeds.   Eyes: Negative for blurred vision and double vision.  Respiratory: Negative for cough, shortness of breath and wheezing.   Cardiovascular: Negative for chest pain and palpitations.  Gastrointestinal: Positive for constipation. Negative for abdominal pain, diarrhea, nausea and vomiting.  Genitourinary: Negative for dysuria.  Musculoskeletal: Negative for myalgias.  Neurological: Positive for weakness. Negative for dizziness, sensory change, speech change, focal weakness, seizures and headaches.  Psychiatric/Behavioral: Negative for depression.    DRUG ALLERGIES:   Allergies  Allergen Reactions  . Cephalexin     Other reaction(s): Other (See Comments) Gross hematuria - occurred once. Not certain whether it was coincidence or causative.    VITALS:  Blood pressure (!) 157/67, pulse 77, temperature 98.5 F (36.9 C), temperature source Oral, resp. rate 18, height 5\' 8"  (1.727 m), weight 67.5 kg (148 lb 12.8 oz), SpO2 98 %.  PHYSICAL EXAMINATION:  Physical Exam  GENERAL:  82 y.o.-year-old patient lying in the bed with no acute distress.  EYES: Pupils equal, round, reactive to light and accommodation. No scleral icterus. Extraocular muscles intact.  HEENT: Head atraumatic, normocephalic. Oropharynx and nasopharynx clear.  NECK:  Supple, no jugular venous distention. No thyroid enlargement, no tenderness.    LUNGS: Normal breath sounds bilaterally, no wheezing, rales,rhonchi or crepitation. No use of accessory muscles of respiration.  CARDIOVASCULAR: S1, S2 normal. No murmurs, rubs, or gallops. right chest permacath in place. ABDOMEN: Soft, nontender, nondistended. Bowel sounds present. No organomegaly or mass.  EXTREMITIES: No pedal edema, cyanosis, or clubbing.  NEUROLOGIC: Cranial nerves II through XII are intact. Muscle strength 5/5 in all extremities. Sensation intact. Gait not checked.  PSYCHIATRIC: The patient is alert and oriented x 3. No depression symptoms SKIN: No obvious rash, lesion, or ulcer.    LABORATORY PANEL:   CBC  Recent Labs Lab 05/08/16 1206 05/09/16 0050  WBC 4.3  --   HGB 7.2* 9.1*  HCT 22.2* 26.6*  PLT 233  --    ------------------------------------------------------------------------------------------------------------------  Chemistries   Recent Labs Lab 05/09/16 0050  NA 138  K 3.3*  CL 101  CO2 30  GLUCOSE 115*  BUN 20  CREATININE 2.99*  CALCIUM 8.2*  AST 17  ALT 8*  ALKPHOS 78  BILITOT 0.5   ------------------------------------------------------------------------------------------------------------------  Cardiac Enzymes No results for input(s): TROPONINI in the last 168 hours. ------------------------------------------------------------------------------------------------------------------  RADIOLOGY:  Dg Chest 1 View  Result Date: 05/08/2016 CLINICAL DATA:  Recent discharge from hospital (perma cath placement). Now with generalized weakness and cough. Denies fever or lung disease EXAM: CHEST 1 VIEW COMPARISON:  04/30/2015 FINDINGS: RIGHT central venous line with tip in distal SVC. No pneumothorax. Normal heart cardiac silhouette. No pulmonary edema no pneumothorax. IMPRESSION: No acute cardiopulmonary process. Electronically Signed   By: Genevive Bi M.D.   On: 05/08/2016 13:49    EKG:   Orders placed or performed during the  hospital encounter of 05/08/16  . EKG 12-Lead  .  EKG 12-Lead  . ED EKG  . ED EKG    ASSESSMENT AND PLAN:   82 y/o M with PMH of HTN, ESRD started on hemodialysis last week, anemia of chronic disease presents from home secondary to weakness, dizziness and constipation.  #1 Weakness- from anemia - s/p transfusion - PT consult pending - check orthostatics if complains of dizziness further  #2 Acute on chronic anemia- anemia of chronic disease - drop from 9 to 7 yesterday on adm, s/p 1unit Tx - monitor closely, likely Epo with dialysis - Improved hb today, stool for occult blood negative once  #3 ESRD- on hemodialysis- on Tue-Thur-Sat schedule, last dialysis yesterday Nephrology consulted  #4 Constipation- laxatives prn  #5 Hypokalemia- replace and monitor  #6 UTI- f/u cultures, on rocephin  #7 HTN- norvasc, clonidine, hydralazine, lisinopril, procardia -  #8 DVT prophylaxis- Heparin SQ   Physical Therapy consult   All the records are reviewed and case discussed with Care Management/Social Workerr. Management plans discussed with the patient, family and they are in agreement.  CODE STATUS: Full Code  TOTAL TIME TAKING CARE OF THIS PATIENT: 37 minutes.   POSSIBLE D/C IN 1-2 DAYS, DEPENDING ON CLINICAL CONDITION.   Enid BaasKALISETTI,Tamaka Sawin M.D on 05/09/2016 at 9:57 AM  Between 7am to 6pm - Pager - [phone]  After 6pm go to [email or web address] - password [email or web address]  [facility]  Office  [phone]  CC: Primary care physician; Lauro RegulusANDERSON,MARSHALL W., MD

## 2016-05-10 DIAGNOSIS — D649 Anemia, unspecified: Secondary | ICD-10-CM

## 2016-05-10 LAB — BASIC METABOLIC PANEL
Anion gap: 8 (ref 5–15)
BUN: 31 mg/dL — AB (ref 6–20)
CALCIUM: 8.5 mg/dL — AB (ref 8.9–10.3)
CHLORIDE: 100 mmol/L — AB (ref 101–111)
CO2: 29 mmol/L (ref 22–32)
CREATININE: 4.44 mg/dL — AB (ref 0.61–1.24)
GFR, EST AFRICAN AMERICAN: 13 mL/min — AB (ref >60)
GFR, EST NON AFRICAN AMERICAN: 11 mL/min — AB (ref >60)
Glucose, Bld: 90 mg/dL (ref 65–99)
Potassium: 3.8 mmol/L (ref 3.5–5.1)
SODIUM: 137 mmol/L (ref 135–145)

## 2016-05-10 LAB — CBC
HCT: 27.4 % — ABNORMAL LOW (ref 40.0–52.0)
Hemoglobin: 9.2 g/dL — ABNORMAL LOW (ref 13.0–18.0)
MCH: 28.1 pg (ref 26.0–34.0)
MCHC: 33.4 g/dL (ref 32.0–36.0)
MCV: 83.9 fL (ref 80.0–100.0)
PLATELETS: 251 10*3/uL (ref 150–440)
RBC: 3.27 MIL/uL — AB (ref 4.40–5.90)
RDW: 16.5 % — ABNORMAL HIGH (ref 11.5–14.5)
WBC: 6.6 10*3/uL (ref 3.8–10.6)

## 2016-05-10 NOTE — Consult Note (Signed)
Eric Minium, MD [facility]  [address] [place], [place] [address] Phone: [phone] Fax : [phone]  Consultation  Referring Provider:     No ref. provider found Primary Care Physician:  Lauro Regulus., MD Primary Gastroenterologist:  None         Reason for Consultation:     Anemia  Date of Admission:  05/08/2016 Date of Consultation:  05/10/2016         HPI:   Eric Yu is a 82 y.o. male who has a history of renal failure and was found to have anemia.  The patient required a blood transfusion. Patient appears to suffer from severe depression and gives very little history.  The patient was found to have anemia with a normal MCV. The patient had his stools checked in the emergency room which were negative for occult blood. The patient is presently on hemodialysis.  There is a history of the patient also reporting constipation.  The nurse today reports that the patient has not had a bowel movement today.  Now being asked to see the patient for anemia.  Past Medical History:  Diagnosis Date  . Chronic kidney disease   . Depression   . Gout   . Hypertension   . Iron deficiency     Past Surgical History:  Procedure Laterality Date  . NO PAST SURGERIES    . PERIPHERAL VASCULAR CATHETERIZATION N/A 04/26/2016   Procedure: Dialysis/Perma Catheter Insertion;  Surgeon: Annice Needy, MD;  Location: [facility] INVASIVE CV LAB;  Service: Cardiovascular;  Laterality: N/A;  . THYROIDECTOMY      Prior to Admission medications   Medication Sig Start Date End Date Taking? Authorizing Provider  acetaminophen (TYLENOL) 325 MG tablet Take 325 mg by mouth every morning.  01/10/15  Yes Historical Provider, MD  albuterol (PROVENTIL HFA;VENTOLIN HFA) 108 (90 Base) MCG/ACT inhaler Inhale 2 puffs into the lungs every morning.    Yes Historical Provider, MD  allopurinol (ZYLOPRIM) 100 MG tablet Take 100 mg by mouth every morning.    Yes Historical Provider, MD  amLODipine (NORVASC) 10 MG tablet  Take 10 mg by mouth every morning.    Yes Historical Provider, MD  aspirin EC 81 MG tablet Take 81 mg by mouth every morning.    Yes Historical Provider, MD  cholecalciferol (VITAMIN D) 1000 units tablet Take 1,000 Units by mouth every morning.    Yes Historical Provider, MD  cloNIDine (CATAPRES) 0.2 MG tablet Take 0.2 mg by mouth 2 (two) times daily.  04/09/15  Yes Historical Provider, MD  ferrous sulfate 325 (65 FE) MG tablet Take 325 mg by mouth every morning.    Yes Historical Provider, MD  FLUoxetine (PROZAC) 10 MG capsule Take 1 capsule (10 mg total) by mouth daily. Patient taking differently: Take 10 mg by mouth every morning.  05/01/16  Yes Enid Baas, MD  gabapentin (NEURONTIN) 100 MG capsule Take 1 capsule (100 mg total) by mouth at bedtime. 05/03/16  Yes Adrian Saran, MD  hydrALAZINE (APRESOLINE) 100 MG tablet Take 100 mg by mouth 3 (three) times daily.   Yes Historical Provider, MD  lisinopril (PRINIVIL,ZESTRIL) 20 MG tablet Take 20 mg by mouth every morning.  07/04/14  Yes Historical Provider, MD  nicotine (NICODERM CQ - DOSED IN MG/24 HOURS) 21 mg/24hr patch Place 21 mg onto the skin daily.   Yes Historical Provider, MD  NIFEdipine (PROCARDIA-XL/ADALAT CC) 60 MG 24 hr tablet Take 60 mg by mouth every morning.  12/19/13  Yes Historical Provider, MD    Family History  Problem Relation Age of Onset  . Cancer Sister   . Hypertension Sister   . Urolithiasis Neg Hx   . Prostate cancer Neg Hx   . Kidney disease Neg Hx   . Kidney cancer Neg Hx      Social History  Substance Use Topics  . Smoking status: Former Smoker    Quit date: 04/16/2016  . Smokeless tobacco: Never Used     Comment: pt is currently using nicotine patches  . Alcohol use No    Allergies as of 05/08/2016 - Review Complete 05/08/2016  Allergen Reaction Noted  . Cephalexin  08/12/2015    Review of Systems:    All systems reviewed and negative except where noted in HPI.   Physical Exam:  Vital signs in  last 24 hours: Temp:  [98.2 F (36.8 C)-99 F (37.2 C)] 98.8 F (37.1 C) (08/28 1215) Pulse Rate:  [77-85] 81 (08/28 1215) Resp:  [16-17] 16 (08/28 1215) BP: (133-158)/(61-75) 155/70 (08/28 1215) SpO2:  [98 %-100 %] 99 % (08/28 1215) Weight:  [151 lb 3.2 oz (68.6 kg)] 151 lb 3.2 oz (68.6 kg) (08/28 0500) Last BM Date: 05/09/16 General:   NAD Head:  Normocephalic and atraumatic. Eyes:   No icterus.   Conjunctiva pink. PERRLA. Ears:  Normal auditory acuity. Neck:  Supple; no masses or thyroidomegaly Lungs: Respirations even and unlabored. Lungs clear to auscultation bilaterally.   No wheezes, crackles, or rhonchi.  Heart:  Regular rate and rhythm;  Without murmur, clicks, rubs or gallops Abdomen:  Soft, nondistended, nontender. Normal bowel sounds. No appreciable masses or hepatomegaly.  No rebound or guarding.  Rectal:  Not performed. Msk:  Symmetrical without gross deformities.    Extremities:  Without edema, cyanosis or clubbing. Neurologic:  Alert and oriented x3;  grossly normal neurologically. Skin:  Intact without significant lesions or rashes. Cervical Nodes:  No significant cervical adenopathy. Psych:  Alert and non cooperative. Flat affect.  LAB RESULTS:  Recent Labs  05/08/16 1206 05/09/16 0050 05/10/16 0544  WBC 4.3  --  6.6  HGB 7.2* 9.1* 9.2*  HCT 22.2* 26.6* 27.4*  PLT 233  --  251   BMET  Recent Labs  05/08/16 1206 05/09/16 0050 05/10/16 0544  NA 136 138 137  K 3.6 3.3* 3.8  CL 101 101 100*  CO2 26 30 29   GLUCOSE 89 115* 90  BUN 36* 20 31*  CREATININE 4.63* 2.99* 4.44*  CALCIUM 8.2* 8.2* 8.5*   LFT  Recent Labs  05/09/16 0050  PROT 6.3*  ALBUMIN 2.8*  AST 17  ALT 8*  ALKPHOS 78  BILITOT 0.5   PT/INR No results for input(s): LABPROT, INR in the last 72 hours.  STUDIES: No results found.    Impression / Plan:   [name] is a 82 y.o. y/o male with anemia.  The patient was admitted with a normocytic anemia and normal MCV with a  negative Hemoccult.   I do not think this patient needs a GI workup at this time.  If the patient is any sign of bleeding or GI cause of blood loss then further recommendations may be forthcoming.   Thank you for involving me in the care of this patient.      LOS: 0 days   Eric Miniumarren Andrea Colglazier, MD  05/10/2016, 7:53 PM   Note: This dictation was prepared with Dragon dictation along with smaller phrase technology. Any transcriptional errors that result  from this process are unintentional.

## 2016-05-10 NOTE — NC FL2 (Signed)
[place] MEDICAID FL2 LEVEL OF CARE SCREENING TOOL     IDENTIFICATION  Patient Name: Eric Yu Birthdate: Jan 25, 1934 Sex: male Admission Date (Current Location): 05/08/2016  [place] and [place] Number:  Chiropodist and Address:  [facility], [address] [place], [place] [address]      Provider Number: [id number]  Attending Physician Name and Address:  Enid Baas, MD  Relative Name and Phone Number:       Current Level of Care: Hospital Recommended Level of Care: Skilled Nursing Facility Prior Approval Number:    Date Approved/Denied:   PASRR Number:  [id number])  Discharge Plan: SNF    Current Diagnoses: Patient Active Problem List   Diagnosis Date Noted  . Anemia 05/08/2016  . Severe major depression, single episode (HCC) 04/28/2016  . Acute renal failure (ARF) (HCC) 04/26/2016  . Injury of kidney 01/08/2015  . Blood loss, postoperative 01/06/2015  . Hypertensive left ventricular hypertrophy 12/26/2014  . Chronic kidney disease, stage IV (severe) (HCC) 12/12/2014  . Chronic kidney disease (CKD), stage III (moderate) 01/16/2014  . Gout 01/16/2014  . Benign hypertension 05/19/2012  . Current tobacco use 05/19/2012    Orientation RESPIRATION BLADDER Height & Weight     Self, Time, Situation, Place  Normal Incontinent Weight: 151 lb 3.2 oz (68.6 kg) Height:  5\' 8"  (172.7 cm)  BEHAVIORAL SYMPTOMS/MOOD NEUROLOGICAL BOWEL NUTRITION STATUS   (None)  (None) Incontinent Diet (renal with fluid restriction Fluid restriction: 1200 mL Fluid)  AMBULATORY STATUS COMMUNICATION OF NEEDS Skin   Extensive Assist Verbally Normal                       Personal Care Assistance Level of Assistance  Bathing, Feeding, Dressing Bathing Assistance: Limited assistance Feeding assistance: Independent Dressing Assistance: Limited assistance     Functional Limitations Info  Sight, Hearing, Speech Sight Info: Adequate Hearing  Info: Adequate Speech Info: Adequate    SPECIAL CARE FACTORS FREQUENCY  PT (By licensed PT)     PT Frequency:  (5)              Contractures      Additional Factors Info  Code Status, Allergies Code Status Info:  (Full Code) Allergies Info:  (Cephalexin)           Current Medications (05/10/2016):  This is the current hospital active medication list Current Facility-Administered Medications  Medication Dose Route Frequency Provider Last Rate Last Dose  . 0.9 %  sodium chloride infusion  250 mL Intravenous PRN Milagros Loll, MD      . acetaminophen (TYLENOL) tablet 650 mg  650 mg Oral Q6H PRN Milagros Loll, MD       Or  . acetaminophen (TYLENOL) suppository 650 mg  650 mg Rectal Q6H PRN Srikar Sudini, MD      . albuterol (PROVENTIL) (2.5 MG/3ML) 0.083% nebulizer solution 2.5 mg  2.5 mg Nebulization Q2H PRN Srikar Sudini, MD      . albuterol (PROVENTIL) (2.5 MG/3ML) 0.083% nebulizer solution 2.5 mg  2.5 mg Nebulization BH-q7a Srikar Sudini, MD   2.5 mg at 05/10/16 0840  . allopurinol (ZYLOPRIM) tablet 100 mg  100 mg Oral Evern Bio, MD   100 mg at 05/10/16 0839  . amLODipine (NORVASC) tablet 10 mg  10 mg Oral Evern Bio, MD   10 mg at 05/10/16 5621  . aspirin EC tablet 81 mg  81 mg Oral BH-q7a Milagros Loll, MD   [phone]  mg at 05/10/16 0839  . bisacodyl (DULCOLAX) suppository 10 mg  10 mg Rectal Daily PRN Milagros LollSrikar Sudini, MD   10 mg at 05/09/16 1652  . cefTRIAXone (ROCEPHIN) 1 g in dextrose 5 % 50 mL IVPB  1 g Intravenous Q24H Milagros LollSrikar Sudini, MD   1 g at 05/10/16 0839  . cloNIDine (CATAPRES) tablet 0.2 mg  0.2 mg Oral BID Milagros LollSrikar Sudini, MD   0.2 mg at 05/10/16 1036  . docusate sodium (COLACE) capsule 100 mg  100 mg Oral BID Milagros LollSrikar Sudini, MD   100 mg at 05/10/16 1038  . ferrous sulfate tablet 325 mg  325 mg Oral Evern BioBH-q7a Srikar Sudini, MD   325 mg at 05/10/16 [id number]  . FLUoxetine (PROZAC) capsule 10 mg  10 mg Oral Evern BioBH-q7a Srikar Sudini, MD   10 mg at 05/10/16 0839  .  gabapentin (NEURONTIN) capsule 100 mg  100 mg Oral QHS Srikar Sudini, MD   100 mg at 05/09/16 2056  . heparin injection 5,000 Units  5,000 Units Subcutaneous Q8H Milagros LollSrikar Sudini, MD   5,000 Units at 05/10/16 1448  . hydrALAZINE (APRESOLINE) injection 10 mg  10 mg Intravenous Q6H PRN Srikar Sudini, MD      . hydrALAZINE (APRESOLINE) tablet 100 mg  100 mg Oral TID Milagros LollSrikar Sudini, MD   100 mg at 05/10/16 1038  . lisinopril (PRINIVIL,ZESTRIL) tablet 20 mg  20 mg Oral Evern BioBH-q7a Srikar Sudini, MD   20 mg at 05/10/16 0839  . nicotine (NICODERM CQ - dosed in mg/24 hours) patch 21 mg  21 mg Transdermal Daily Milagros LollSrikar Sudini, MD   21 mg at 05/10/16 1039  . NIFEdipine (PROCARDIA-XL/ADALAT CC) 24 hr tablet 60 mg  60 mg Oral Evern BioBH-q7a Srikar Sudini, MD   60 mg at 05/10/16 [id number]  . ondansetron (ZOFRAN) tablet 4 mg  4 mg Oral Q6H PRN Milagros LollSrikar Sudini, MD       Or  . ondansetron (ZOFRAN) injection 4 mg  4 mg Intravenous Q6H PRN Srikar Sudini, MD      . polyethylene glycol (MIRALAX / GLYCOLAX) packet 17 g  17 g Oral Daily PRN Milagros LollSrikar Sudini, MD   17 g at 05/09/16 1000  . senna (SENOKOT) tablet 8.6 mg  1 tablet Oral BID Milagros LollSrikar Sudini, MD   8.6 mg at 05/10/16 1037  . sodium chloride flush (NS) 0.9 % injection 3 mL  3 mL Intravenous Q12H Srikar Sudini, MD   3 mL at 05/10/16 1038  . sodium chloride flush (NS) 0.9 % injection 3 mL  3 mL Intravenous PRN Milagros LollSrikar Sudini, MD         Discharge Medications: Please see discharge summary for a list of discharge medications.  Relevant Imaging Results:  Relevant Lab Results:   Additional Information  (SSN [id number])  Verta Ellenhristina E Brynlyn Dade, LCSW

## 2016-05-10 NOTE — Progress Notes (Signed)
Initial Nutrition Assessment     INTERVENTION:  -Monitor intake and cater to pt prefernces -May need to add nepro q daily for additional kcals and protein pending intake   NUTRITION DIAGNOSIS:   Inadequate oral intake related to acute illness, lethargy/confusion as evidenced by meal completion < 25%.    GOAL:   Patient will meet greater than or equal to 90% of their needs    MONITOR:   PO intake, Supplement acceptance, Labs, Weight trends  REASON FOR ASSESSMENT:   Malnutrition Screening Tool    ASSESSMENT:      Pt admitted with abdominal pain, weakness, started in HD last week, constipation, anemia, UTI  Past Medical History:  Diagnosis Date  . Chronic kidney disease   . Depression   . Gout   . Hypertension   . Iron deficiency    Pt extremely lethargic this am during visit, mumbled few words regarding intake.  Reports ate applesauce and eggs this am.  Unable to gather information regarding intake prior to admission  Medications reviewed: fe sulfate, colace, senna  Labs reviewed: BUN 31, creatinine 4.44  Nutrition-Focused physical exam completed. Findings are moderate fat depletion, mild-moderate muscle depletion, and no edema.    Diet Order:  Diet renal with fluid restriction Fluid restriction: 1200 mL Fluid; Room service appropriate? Yes; Fluid consistency: Thin  Skin:  Reviewed, no issues  Last BM:  8/27  Height:   Ht Readings from Last 1 Encounters:  05/08/16 5\' 8"  (1.727 m)    Weight: 8% wt loss in the last 9 months  Wt Readings from Last 1 Encounters:  05/10/16 151 lb 3.2 oz (68.6 kg)    Ideal Body Weight:     BMI:  Body mass index is 22.99 kg/m.  Estimated Nutritional Needs:   Kcal:  2040-2380 kcals/d  Protein:  82-102 g/d  Fluid:  1000ml plus UOP  EDUCATION NEEDS:   No education needs identified at this time  Donnah Levert B. Freida BusmanAllen, RD, LDN [phone] (pager) Weekend/On-Call pager [phone])

## 2016-05-10 NOTE — Progress Notes (Signed)
[facility] - [place] at [facility]   PATIENT NAME: Eric HalsJohn Yu    MR#:  [id number]  DATE OF BIRTH:  1933/11/26  SUBJECTIVE:  CHIEF COMPLAINT:   Chief Complaint  Patient presents with  . Abdominal Pain  . Weakness   - very sleepy today, depressed, not wanting to talk to anybody - refused dialysis when asked by nephrology team  REVIEW OF SYSTEMS:  Review of Systems  Unable to perform ROS: Mental status change    DRUG ALLERGIES:   Allergies  Allergen Reactions  . Cephalexin     Other reaction(s): Other (See Comments) Gross hematuria - occurred once. Not certain whether it was coincidence or causative.    VITALS:  Blood pressure (!) 155/70, pulse 81, temperature 98.8 F (37.1 C), temperature source Tympanic, resp. rate 16, height 5\' 8"  (1.727 m), weight 68.6 kg (151 lb 3.2 oz), SpO2 99 %.  PHYSICAL EXAMINATION:  Physical Exam  GENERAL:  82 y.o.-year-old patient lying in the bed with no acute distress. Sleeping, and gets irritable when trying to talk EYES: Pupils equal, round, reactive to light and accommodation. No scleral icterus. Extraocular muscles intact.  HEENT: Head atraumatic, normocephalic. Oropharynx and nasopharynx clear.  NECK:  Supple, no jugular venous distention. No thyroid enlargement, no tenderness.  LUNGS: Normal breath sounds bilaterally, no wheezing, rales,rhonchi or crepitation. No use of accessory muscles of respiration.  CARDIOVASCULAR: S1, S2 normal. No murmurs, rubs, or gallops. right chest permacath in place. ABDOMEN: Soft, nontender, nondistended. Bowel sounds present. No organomegaly or mass.  EXTREMITIES: No pedal edema, cyanosis, or clubbing.  NEUROLOGIC: Moving all extremities, global weakness.  PSYCHIATRIC: The patient is alert, not interacting well. Not wanting to be bothered SKIN: No obvious rash, lesion, or ulcer.    LABORATORY PANEL:   CBC  Recent Labs Lab 05/10/16 0544  WBC 6.6  HGB 9.2*  HCT 27.4*  PLT  251   ------------------------------------------------------------------------------------------------------------------  Chemistries   Recent Labs Lab 05/09/16 0050 05/10/16 0544  NA 138 137  K 3.3* 3.8  CL 101 100*  CO2 30 29  GLUCOSE 115* 90  BUN 20 31*  CREATININE 2.99* 4.44*  CALCIUM 8.2* 8.5*  AST 17  --   ALT 8*  --   ALKPHOS 78  --   BILITOT 0.5  --    ------------------------------------------------------------------------------------------------------------------  Cardiac Enzymes No results for input(s): TROPONINI in the last 168 hours. ------------------------------------------------------------------------------------------------------------------  RADIOLOGY:  Dg Chest 1 View  Result Date: 05/08/2016 CLINICAL DATA:  Recent discharge from hospital (perma cath placement). Now with generalized weakness and cough. Denies fever or lung disease EXAM: CHEST 1 VIEW COMPARISON:  04/30/2015 FINDINGS: RIGHT central venous line with tip in distal SVC. No pneumothorax. Normal heart cardiac silhouette. No pulmonary edema no pneumothorax. IMPRESSION: No acute cardiopulmonary process. Electronically Signed   By: Genevive BiStewart  Edmunds M.D.   On: 05/08/2016 13:49    EKG:   Orders placed or performed during the hospital encounter of 05/08/16  . EKG 12-Lead  . EKG 12-Lead  . ED EKG  . ED EKG    ASSESSMENT AND PLAN:   82 y/o M with PMH of HTN, ESRD started on hemodialysis last week, anemia of chronic disease presents from home secondary to weakness, dizziness and constipation.  #1 Weakness- from anemia - s/p transfusion - PT consult pending  #2 Acute on chronic anemia- anemia of chronic disease -  s/p 1unit Tx, hb now stable at 9 - GI consult pending - monitor closely,  Epo with dialysis - stool for occult blood negative once  #3 ESRD- on hemodialysis- on Tue-Thur-Sat schedule, dialysis tomorrow per schedule Nephrology consulted  #4 Constipation- laxatives prn  #5  Hypokalemia- replaced and monitor  #6 UTI- f/u cultures, on rocephin  #7 HTN- norvasc, clonidine, hydralazine, lisinopril, procardia -  #8 DVT prophylaxis- Heparin SQ   Physical Therapy consulted Palliative care consult requested. Seen by Psychiatry during last admission- prozac being continued. Patient has mentioned not wanting dialysis during last admission as well.    All the records are reviewed and case discussed with Care Management/Social Workerr. Management plans discussed with the patient, family and they are in agreement.  CODE STATUS: Full Code  TOTAL TIME TAKING CARE OF THIS PATIENT: 37 minutes.   POSSIBLE D/C IN 1-2 DAYS, DEPENDING ON CLINICAL CONDITION.   Enid Baas M.D on 05/10/2016 at 1:38 PM  Between 7am to 6pm - Pager - [phone]  After 6pm go to [email or web address] - password [employer]  [facility]  Office  [phone]  CC: Primary care physician; Lauro Regulus., MD

## 2016-05-10 NOTE — Clinical Social Work Note (Signed)
Clinical Social Work Assessment  Patient Details  Name: Eric Yu MRN: [id number] Date of Birth: 1933-12-15  Date of referral:  05/10/16               Reason for consult:  Discharge Planning                Permission sought to share information with:  Family Supports Permission granted to share information::  Yes, Verbal Permission Granted  Name::        Agency::     Relationship::   ([name] Earnie Larsson- Sister)  Contact Information:     Housing/Transportation Living arrangements for the past 2 months:  [place] of Information:  Patient, Other (Comment Required) ([name] Earnie Larsson- Sister) Patient Interpreter Needed:  None Criminal Activity/Legal Involvement Pertinent to Current Situation/Hospitalization:  No - Comment as needed Significant Relationships:  Siblings, Friend Lives with:  Self Do you feel safe going back to the place where you live?  No (Patient lives alone) Need for family participation in patient care:  Yes (Comment) ([name] Earnie Larsson- Sister)  Care giving concerns:  Patient reports that he does not want to go to dialysis. Stated he's not having a good day. Patient may need SNF placement at discharge to regain strength before he returns home.    Social Worker assessment / plan:  CSW met with patient at bedside. Patient was asleep but woke up when CSW called his name. He talked with his eyes closed. Granted CSW verbal permission to speak about discharge plans in front of his sister Leeroy Bock [phone]) and friend. CSW informed patient that there were some concerns about him returning home alone. Informed patient that he may need SNF placement at discharge for STR. Patient reported that he'd not interested in receiving diaclasis and feels somewhat depressed. Stated that he is willing to go to STR at discharge to build up his strength. Granted CSW verbal permission to send SNF referral to SNFs in [place]. FL2/ PASRR completed and placed in MDs basket for cosign.  Awaiting bed offers. CSW will continue to follow and assist.   Employment status:  Retired Nurse, adult PT Recommendations:  Not assessed at this time Information / Referral to community resources:  [place]  Patient/Family's Response to care:  Patient is in agreement for a SNF bed search.   Patient/Family's Understanding of and Emotional Response to Diagnosis, Current Treatment, and Prognosis:  CSW was unable to assess patient's emotions. He stated he was not in the mood today and didn't want to talk much.  Emotional Assessment Appearance:  Appears stated age Attitude/Demeanor/Rapport:   (None) Affect (typically observed):  Calm, Depressed Orientation:  Oriented to Self, Oriented to Place, Oriented to  Time, Oriented to Situation Alcohol / Substance use:  Not Applicable Psych involvement (Current and /or in the community):  No (Comment)  Discharge Needs  Concerns to be addressed:  Discharge Planning Concerns Readmission within the last 30 days:  No Current discharge risk:  Chronically ill Barriers to Discharge:  Continued Medical Work up   [employer], LCSW 05/10/2016, 3:34 PM

## 2016-05-10 NOTE — Progress Notes (Signed)
[name]  ROUNDING NOTE   Subjective:   Weak appearing, mumbling States he wants to stop dialysis No SOB Did not eat any breakfast  Objective:  Vital signs in last 24 hours:  Temp:  [98.2 F (36.8 C)-99 F (37.2 C)] 99 F (37.2 C) (08/28 0529) Pulse Rate:  [73-85] 85 (08/28 0529) Resp:  [17-20] 17 (08/28 0529) BP: (133-158)/(60-75) 158/75 (08/28 0529) SpO2:  [98 %-100 %] 98 % (08/28 0840) Weight:  [68.6 kg (151 lb 3.2 oz)] 68.6 kg (151 lb 3.2 oz) (08/28 0500)  Weight change: -7.757 kg (-17 lb 1.6 oz) Filed Weights   05/08/16 1825 05/09/16 0500 05/10/16 0500  Weight: 69.3 kg (152 lb 12.5 oz) 67.5 kg (148 lb 12.8 oz) 68.6 kg (151 lb 3.2 oz)    Intake/Output: I/O last 3 completed shifts: In: 550 [P.O.:180; Blood:320; IV Piggyback:50] Out: 1400 [Other:1400]   Intake/Output this shift:  Total I/O In: 10 [I.V.:10] Out: -   Physical Exam: General: No acute distress  Head: Normocephalic, atraumatic. Moist oral mucosal membranes  Eyes: Anicteric  Neck: Supple, trachea midline  Lungs:  Clear to auscultation, normal effort  Heart: S1S2 no rubs  Abdomen:  Soft, nontender, Bowel sounds present   Extremities: No peripheral edema.  Neurologic: Nonfocal, moving all four extremities  Skin: No lesions  Access: R IJ permcath 04/26/16    Basic Metabolic Panel:  Recent Labs Lab 05/08/16 1206 05/09/16 0050 05/10/16 0544  NA 136 138 137  K 3.6 3.3* 3.8  CL 101 101 100*  CO2 26 30 29   GLUCOSE 89 115* 90  BUN 36* 20 31*  CREATININE 4.63* 2.99* 4.44*  CALCIUM 8.2* 8.2* 8.5*    Liver Function Tests:  Recent Labs Lab 05/09/16 0050  AST 17  ALT 8*  ALKPHOS 78  BILITOT 0.5  PROT 6.3*  ALBUMIN 2.8*   No results for input(s): LIPASE, AMYLASE in the last 168 hours. No results for input(s): AMMONIA in the last 168 hours.  CBC:  Recent Labs Lab 05/08/16 1206 05/09/16 0050 05/10/16 0544  WBC 4.3  --  6.6  HGB 7.2* 9.1* 9.2*  HCT 22.2* 26.6*  27.4*  MCV 85.7  --  83.9  PLT 233  --  251    Cardiac Enzymes: No results for input(s): CKTOTAL, CKMB, CKMBINDEX, TROPONINI in the last 168 hours.  BNP: Invalid input(s): POCBNP  CBG:  Recent Labs Lab 05/08/16 1210  GLUCAP 86    Microbiology: Results for orders placed or performed during the hospital encounter of 05/08/16  Urine culture     Status: Abnormal   Collection Time: 05/08/16 12:55 PM  Result Value Ref Range Status   Specimen Description URINE, RANDOM  Final   Special Requests NONE  Final   Culture (A)  Final    <10,000 COLONIES/mL INSIGNIFICANT GROWTH [facility]    Report Status 05/09/2016 FINAL  Final  Blood culture (routine x 2)     Status: None (Preliminary result)   Collection Time: 05/08/16  1:50 PM  Result Value Ref Range Status   Specimen Description BLOOD RIGHT ASSIST CONTROL  Final   Special Requests BOTTLES DRAWN AEROBIC AND ANAEROBIC 7CCAERO,5CCANA  Final   Culture NO GROWTH 2 DAYS  Final   Report Status PENDING  Incomplete  Blood culture (routine x 2)     Status: None (Preliminary result)   Collection Time: 05/08/16  2:00 PM  Result Value Ref Range Status   Specimen Description BLOOD  Final   Special Requests BOTTLES DRAWN AEROBIC AND ANAEROBIC 1CCAERO,1CCANA  Final   Culture NO GROWTH 2 DAYS  Final   Report Status PENDING  Incomplete    Coagulation Studies: No results for input(s): LABPROT, INR in the last 72 hours.  Urinalysis:  Recent Labs  05/08/16 1255  COLORURINE YELLOW*  LABSPEC 1.014  PHURINE 5.0  GLUCOSEU NEGATIVE  HGBUR NEGATIVE  BILIRUBINUR NEGATIVE  KETONESUR NEGATIVE  PROTEINUR 100*  NITRITE NEGATIVE  LEUKOCYTESUR TRACE*      Imaging: Dg Chest 1 View  Result Date: 05/08/2016 CLINICAL DATA:  Recent discharge from hospital (perma cath placement). Now with generalized weakness and cough. Denies fever or lung disease EXAM: CHEST 1 VIEW COMPARISON:  04/30/2015 FINDINGS: RIGHT central venous  line with tip in distal SVC. No pneumothorax. Normal heart cardiac silhouette. No pulmonary edema no pneumothorax. IMPRESSION: No acute cardiopulmonary process. Electronically Signed   By: Genevive Bi M.D.   On: 05/08/2016 13:49     Medications:     . albuterol  2.5 mg Nebulization BH-q7a  . allopurinol  100 mg Oral BH-q7a  . amLODipine  10 mg Oral BH-q7a  . aspirin EC  81 mg Oral BH-q7a  . cefTRIAXone (ROCEPHIN)  IV  1 g Intravenous Q24H  . cloNIDine  0.2 mg Oral BID  . docusate sodium  100 mg Oral BID  . ferrous sulfate  325 mg Oral BH-q7a  . FLUoxetine  10 mg Oral BH-q7a  . gabapentin  100 mg Oral QHS  . heparin  5,000 Units Subcutaneous Q8H  . hydrALAZINE  100 mg Oral TID  . lisinopril  20 mg Oral BH-q7a  . nicotine  21 mg Transdermal Daily  . NIFEdipine  60 mg Oral BH-q7a  . senna  1 tablet Oral BID  . sodium chloride flush  3 mL Intravenous Q12H   sodium chloride, acetaminophen **OR** acetaminophen, albuterol, bisacodyl, hydrALAZINE, ondansetron **OR** ondansetron (ZOFRAN) IV, polyethylene glycol, sodium chloride flush  Assessment/ Plan:  82 y.o.black male with hypertension, gout, anemia, depression, COPD and tobacco abuse   TTS [facility] RIJ permcath [facility].   1. End-stage renal disease followed by [facility] nephrology. Started on dialysis on 8/14.   Continue TTS schedule. May need palliative care evaluation as he is contemplating stopping dialysis  2. Anemia of chronic kidney disease: with iron deficiency. PRBC transfusion done on  8/26. - epo with hemodialysis treatment   3. Secondary hyperparathyroidism: PTH 310, phos and calcium at goal - not on any binders or vitamin D agents.       LOS: 0 Oralee Rapaport 8/28/20179:30 AM

## 2016-05-10 NOTE — Progress Notes (Signed)
PT Cancellation Note  Patient Details Name: Eric Yu MRN: [id number] DOB: 03/22/1934   Cancelled Treatment:    Reason Eval/Treat Not Completed: Patient declined, no reason specified.  Pt refused all attempts at PT evaluation (nursing and CM notified).  Pt stating "I don't want to talk to anyone" and barely opened his eyes when attempting to talk to pt.  Will re-attempt PT eval at a later date/time.   Hendricks Limesmily Nazir Hacker 05/10/2016, 12:38 PM Hendricks LimesEmily Tacora Athanas, PT [phone]

## 2016-05-11 DIAGNOSIS — Z515 Encounter for palliative care: Secondary | ICD-10-CM

## 2016-05-11 DIAGNOSIS — R5381 Other malaise: Secondary | ICD-10-CM

## 2016-05-11 DIAGNOSIS — Z789 Other specified health status: Secondary | ICD-10-CM

## 2016-05-11 DIAGNOSIS — Z66 Do not resuscitate: Secondary | ICD-10-CM | POA: Diagnosis not present

## 2016-05-11 DIAGNOSIS — N186 End stage renal disease: Secondary | ICD-10-CM

## 2016-05-11 DIAGNOSIS — Z992 Dependence on renal dialysis: Secondary | ICD-10-CM

## 2016-05-11 DIAGNOSIS — R531 Weakness: Secondary | ICD-10-CM

## 2016-05-11 LAB — BASIC METABOLIC PANEL
ANION GAP: 11 (ref 5–15)
BUN: 37 mg/dL — AB (ref 6–20)
CO2: 26 mmol/L (ref 22–32)
Calcium: 8.5 mg/dL — ABNORMAL LOW (ref 8.9–10.3)
Chloride: 100 mmol/L — ABNORMAL LOW (ref 101–111)
Creatinine, Ser: 5.2 mg/dL — ABNORMAL HIGH (ref 0.61–1.24)
GFR calc Af Amer: 11 mL/min — ABNORMAL LOW (ref >60)
GFR, EST NON AFRICAN AMERICAN: 9 mL/min — AB (ref >60)
GLUCOSE: 76 mg/dL (ref 65–99)
POTASSIUM: 4.2 mmol/L (ref 3.5–5.1)
Sodium: 137 mmol/L (ref 135–145)

## 2016-05-11 LAB — PHOSPHORUS: Phosphorus: 5.3 mg/dL — ABNORMAL HIGH (ref 2.5–4.6)

## 2016-05-11 MED ORDER — SENNOSIDES-DOCUSATE SODIUM 8.6-50 MG PO TABS: 2.0000 | ORAL_TABLET | Freq: Every day | ORAL | 2 refills | Status: DC

## 2016-05-11 MED ORDER — ACETAMINOPHEN 500 MG PO TABS: 500.0000 mg | ORAL_TABLET | Freq: Four times a day (QID) | ORAL | 0 refills | Status: DC | PRN

## 2016-05-11 MED ORDER — EPOETIN ALFA 10000 UNIT/ML IJ SOLN: 4000.0000 [IU] | INTRAMUSCULAR | Status: DC

## 2016-05-11 MED ORDER — POLYETHYLENE GLYCOL 3350 17 G PO PACK: 17.0000 g | PACK | Freq: Every day | ORAL | 0 refills | Status: DC | PRN

## 2016-05-11 NOTE — Consult Note (Signed)
Consultation Note Date: 05/11/2016   Patient Name: Eric Yu  DOB: 1934-06-13  MRN: [id number]  Age / Sex: 82 y.o., male  PCP: Eric RegulusMarshall W Anderson, MD Referring Physician: Enid Baasadhika Kalisetti, MD  Reason for Consultation: Establishing goals of care and Psychosocial/spiritual support  Eric Yu: 82 y.o. male admitted on 05/08/2016 with known history of Hypertension, recently diagnosed end stage renal disease on hemodialysis, anemia of chronic disease presents to the hospital with worsening weakness, constipation and dizziness. Since discharge from the hospital 6 days back patient has barely been able to get out of bed.   He lives alone with the support of his sister, she reports continued physical and functional decline over the past several months  In the emergency room patient has been noticed to have worsening anemia with hemoglobin 7.2, UTI and was admitted  There was some discussion about his desire to continue with dialysis.  At this time is is open to dialysis as a life prolonging measure, he is open to SNF for rehab.  He is faced with advanced directive decisions and anticipatory care needs   Clinical Assessment and Goals of Care:  This NP Eric Yu reviewed medical records, received report from team, assessed the patient and then meet at the patient's bedside   to discuss diagnosis, prognosis, GOC, EOL wishes disposition and options.   A detailed discussion was had today regarding advanced directives.  Concepts specific to code status, artifical feeding and hydration, continued IV antibiotics and rehospitalization was had.  The difference between a aggressive medical intervention path  and a palliative comfort care path for this patient at this time was had.  Values and goals of care important to patient and family were attempted to be elicited.  I then spoke to his sister Eric Yu  discussing the same topics.   Concept of Hospice and Palliative Care were discussed  Natural trajectory and expectations at EOL were discussed, specific to ESRD    Questions and concerns addressed.   Family encouraged to call with questions or concerns.  PMT will continue to support holistically.     SUMMARY OF RECOMMENDATIONS    Code Status/Advance Care Planning:  DNR-documented today    Symptom Management:   Weeakness: PT evluation and treatmetn  Palliative Prophylaxis:   Aspiration, Bowel Regimen, Delirium Protocol, Frequent Pain Assessment and Oral Care  Psycho-social/Spiritual:   Desire for further Chaplaincy support:yes  Additional Recommendations: Education on Hospice  Prognosis:   Dependant on desire for life prolonging measures, high risk for decompensation.   Discharge Planning: Skilled Nursing Facility for rehab with Palliative care service follow-up      Primary Diagnoses: Present on Admission: . Symptomatic anemia   I have reviewed the medical record, interviewed the patient and family, and examined the patient. The following aspects are pertinent.  Past Medical History:  Diagnosis Date  . Chronic kidney disease   . Depression   . Gout   . Hypertension   . Iron deficiency    Social History   Social History  .  Marital status: Widowed    Spouse name: N/A  . Number of children: N/A  . Years of education: N/A   Social History Main Topics  . Smoking status: Former Smoker    Quit date: 04/16/2016  . Smokeless tobacco: Never Used     Comment: pt is currently using nicotine patches  . Alcohol use No  . Drug use: No  . Sexual activity: Not Currently   Other Topics Concern  . None   Social History Narrative  . None   Family History  Problem Relation Age of Onset  . Cancer Sister   . Hypertension Sister   . Urolithiasis Neg Hx   . Prostate cancer Neg Hx   . Kidney disease Neg Hx   . Kidney cancer Neg Hx    Scheduled Meds: .  albuterol  2.5 mg Nebulization BH-q7a  . allopurinol  100 mg Oral BH-q7a  . amLODipine  10 mg Oral BH-q7a  . aspirin EC  81 mg Oral BH-q7a  . cefTRIAXone (ROCEPHIN)  IV  1 g Intravenous Q24H  . cloNIDine  0.2 mg Oral BID  . docusate sodium  100 mg Oral BID  . ferrous sulfate  325 mg Oral BH-q7a  . FLUoxetine  10 mg Oral BH-q7a  . gabapentin  100 mg Oral QHS  . heparin  5,000 Units Subcutaneous Q8H  . hydrALAZINE  100 mg Oral TID  . lisinopril  20 mg Oral BH-q7a  . nicotine  21 mg Transdermal Daily  . NIFEdipine  60 mg Oral BH-q7a  . senna  1 tablet Oral BID  . sodium chloride flush  3 mL Intravenous Q12H   Continuous Infusions:  PRN Meds:.sodium chloride, acetaminophen **OR** acetaminophen, albuterol, bisacodyl, hydrALAZINE, ondansetron **OR** ondansetron (ZOFRAN) IV, polyethylene glycol, sodium chloride flush Medications Prior to Admission:  Prior to Admission medications   Medication Sig Start Date End Date Taking? Authorizing Provider  acetaminophen (TYLENOL) 325 MG tablet Take 325 mg by mouth every morning.  01/10/15  Yes Historical Provider, MD  albuterol (PROVENTIL HFA;VENTOLIN HFA) 108 (90 Base) MCG/ACT inhaler Inhale 2 puffs into the lungs every morning.    Yes Historical Provider, MD  allopurinol (ZYLOPRIM) 100 MG tablet Take 100 mg by mouth every morning.    Yes Historical Provider, MD  amLODipine (NORVASC) 10 MG tablet Take 10 mg by mouth every morning.    Yes Historical Provider, MD  aspirin EC 81 MG tablet Take 81 mg by mouth every morning.    Yes Historical Provider, MD  cholecalciferol (VITAMIN D) 1000 units tablet Take 1,000 Units by mouth every morning.    Yes Historical Provider, MD  cloNIDine (CATAPRES) 0.2 MG tablet Take 0.2 mg by mouth 2 (two) times daily.  04/09/15  Yes Historical Provider, MD  ferrous sulfate 325 (65 FE) MG tablet Take 325 mg by mouth every morning.    Yes Historical Provider, MD  FLUoxetine (PROZAC) 10 MG capsule Take 1 capsule (10 mg total) by  mouth daily. Patient taking differently: Take 10 mg by mouth every morning.  05/01/16  Yes Eric Baas, MD  gabapentin (NEURONTIN) 100 MG capsule Take 1 capsule (100 mg total) by mouth at bedtime. 05/03/16  Yes Adrian Saran, MD  hydrALAZINE (APRESOLINE) 100 MG tablet Take 100 mg by mouth 3 (three) times daily.   Yes Historical Provider, MD  lisinopril (PRINIVIL,ZESTRIL) 20 MG tablet Take 20 mg by mouth every morning.  07/04/14  Yes Historical Provider, MD  nicotine (NICODERM CQ - DOSED IN  MG/24 HOURS) 21 mg/24hr patch Place 21 mg onto the skin daily.   Yes Historical Provider, MD  NIFEdipine (PROCARDIA-XL/ADALAT CC) 60 MG 24 hr tablet Take 60 mg by mouth every morning.  12/19/13  Yes Historical Provider, MD   Allergies  Allergen Reactions  . Cephalexin     Other reaction(s): Other (See Comments) Gross hematuria - occurred once. Not certain whether it was coincidence or causative.   Review of Systems  Constitutional: Positive for fatigue.  Neurological: Positive for weakness.    Physical Exam  Constitutional: He appears cachectic. He is cooperative. He appears ill.  Cardiovascular: Normal rate, regular rhythm and normal heart sounds.   Pulmonary/Chest: He has decreased breath sounds.  Musculoskeletal:  -generalized weakness and atrophy  Skin: Skin is warm and dry.    Vital Signs: BP 140/71 (BP Location: Right Arm)   Pulse 79   Temp 98.5 F (36.9 C) (Oral)   Resp 16   Ht 5\' 8"  (1.727 m)   Wt 69.1 kg (152 lb 6.4 oz)   SpO2 99%   BMI 23.17 kg/m  Pain Assessment: No/denies pain   Pain Score: 0-No pain   SpO2: SpO2: 99 % O2 Device:SpO2: 99 % O2 Flow Rate: .O2 Flow Rate (L/min): 0 L/min  IO: Intake/output summary:  Intake/Output Summary (Last 24 hours) at 05/11/16 0900 Last data filed at 05/11/16 1610  Gross per 24 hour  Intake              100 ml  Output                0 ml  Net              100 ml    LBM: Last BM Date: 05/09/16 Baseline Weight: Weight: 76.3 kg (168  lb 4.8 oz) Most recent weight: Weight: 69.1 kg (152 lb 6.4 oz)      Palliative Assessment/Data: 30% at best   Flowsheet Rows   Flowsheet Row Most Recent Value  Intake Tab  Referral Department  Hospitalist  Unit at Time of Referral  Med/Surg Unit  Palliative Care Primary Diagnosis  Nephrology  Date Notified  05/10/16  Palliative Care Type  New Palliative care  Reason for referral  Clarify Goals of Care  Date of Admission  05/08/16  # of days IP prior to Palliative referral  2  Clinical Assessment  Psychosocial & Spiritual Assessment  Palliative Care Outcomes     Discussed with Dr Nemiah Commander  Time In: 0745 Time Out: 0900 Time Total: 75 min Greater than 50%  of this time was spent counseling and coordinating care related to the above assessment and plan.  Signed by: Eric Creed, NP   Please contact Palliative Medicine Team phone at [phone] for questions and concerns.  For individual provider: See Loretha Stapler

## 2016-05-11 NOTE — Progress Notes (Signed)
POST hd vitals

## 2016-05-11 NOTE — Progress Notes (Deleted)
Pt discharged home in the company of EMS. Report called on previous shift. Packet sent with patient. IV's D/c'd with catheter intact.

## 2016-05-11 NOTE — Progress Notes (Signed)
CSW [facility] [employer] Case [facility]. Informed her that patient will not be discharged on Rocephin per MD. Also, gave her verbal report on patient's ability to complete ADL;s. She reported she would update patient's auth and give CSW a call back. CSW will continue to follow and assist.  Woodroe Modehristina Hanae Waiters, MSW, LCSW, LCAS-A Clinical Social Worker [phone]

## 2016-05-11 NOTE — Clinical Social Work Placement (Signed)
   CLINICAL SOCIAL WORK PLACEMENT  NOTE  Date:  05/11/2016  Patient Details  Name: Eric HalsJohn Riemenschneider MRN: [id number] Date of Birth: [birth year]  Clinical Social Work is seeking post-discharge placement for this patient at the Skilled  Nursing Facility level of care (*CSW will initial, date and re-position this form in  chart as items are completed):  Yes   Patient/family provided with [facility] Clinical Social Work Department's list of facilities offering this level of care within the geographic area requested by the patient (or if unable, by the patient's family).  Yes   Patient/family informed of their freedom to choose among providers that offer the needed level of care, that participate in Medicare, Medicaid or managed care program needed by the patient, have an available bed and are willing to accept the patient.  Yes   Patient/family informed of [facility]'s ownership interest in [facility] and [facility], as well as of the fact that they are under no obligation to receive care at these facilities.  PASRR submitted to EDS on       PASRR number received on       Existing PASRR number confirmed on 05/11/16     FL2 transmitted to all facilities in geographic area requested by pt/family on 05/11/16     FL2 transmitted to all facilities within larger geographic area on       Patient informed that his/her managed care company has contracts with or will negotiate with certain facilities, including the following:        Yes   Patient/family informed of bed offers received.  Patient chooses bed at  ([facility])     Physician recommends and patient chooses bed at      Patient to be transferred to  ([facility]) on 05/11/16.  Patient to be transferred to facility by  (EMS)     Patient family notified on 05/11/16 of transfer.  Name of family member notified:   (Sister)     PHYSICIAN       Additional Comment:    _______________________________________________ Idamae Lusherhristina E Michael Ventresca,  LCSW 05/11/2016, 3:43 PM

## 2016-05-11 NOTE — Progress Notes (Signed)
[facility]  ROUNDING NOTE   Subjective:   Sitting up eating breakfast  Doing better States he wants to continue dialysis He refused yesterday because he was "frustated" No N/V No SOB  Objective:  Vital signs in last 24 hours:  Temp:  [98.5 F (36.9 C)-98.8 F (37.1 C)] 98.5 F (36.9 C) (08/29 0538) Pulse Rate:  [78-81] 79 (08/29 0538) Resp:  [16] 16 (08/29 0538) BP: (134-155)/(70-72) 140/71 (08/29 0538) SpO2:  [98 %-100 %] 99 % (08/29 0743) Weight:  [69.1 kg (152 lb 6.4 oz)] 69.1 kg (152 lb 6.4 oz) (08/29 0500)  Weight change: 0.544 kg (1 lb 3.2 oz) Filed Weights   05/09/16 0500 05/10/16 0500 05/11/16 0500  Weight: 67.5 kg (148 lb 12.8 oz) 68.6 kg (151 lb 3.2 oz) 69.1 kg (152 lb 6.4 oz)    Intake/Output: I/O last 3 completed shifts: In: 290 [P.O.:280; I.V.:10] Out: 0    Intake/Output this shift:  No intake/output data recorded.  Physical Exam: General: No acute distress  Head: Normocephalic, atraumatic. Moist oral mucosal membranes  Eyes: Anicteric  Neck: Supple, trachea midline  Lungs:  Clear to auscultation, normal effort  Heart: S1S2 no rubs  Abdomen:  Soft, nontender, Bowel sounds present   Extremities: No peripheral edema.  Neurologic: Nonfocal, moving all four extremities  Skin: No lesions  Access: R IJ permcath 04/26/16    Basic Metabolic Panel:  Recent Labs Lab 05/08/16 1206 05/09/16 0050 05/10/16 0544 05/11/16 0437  NA 136 138 137 137  K 3.6 3.3* 3.8 4.2  CL 101 101 100* 100*  CO2 26 30 29 26   GLUCOSE 89 115* 90 76  BUN 36* 20 31* 37*  CREATININE 4.63* 2.99* 4.44* 5.20*  CALCIUM 8.2* 8.2* 8.5* 8.5*    Liver Function Tests:  Recent Labs Lab 05/09/16 0050  AST 17  ALT 8*  ALKPHOS 78  BILITOT 0.5  PROT 6.3*  ALBUMIN 2.8*   No results for input(s): LIPASE, AMYLASE in the last 168 hours. No results for input(s): AMMONIA in the last 168 hours.  CBC:  Recent Labs Lab 05/08/16 1206 05/09/16 0050 05/10/16 0544   WBC 4.3  --  6.6  HGB 7.2* 9.1* 9.2*  HCT 22.2* 26.6* 27.4*  MCV 85.7  --  83.9  PLT 233  --  251    Cardiac Enzymes: No results for input(s): CKTOTAL, CKMB, CKMBINDEX, TROPONINI in the last 168 hours.  BNP: Invalid input(s): POCBNP  CBG:  Recent Labs Lab 05/08/16 1210  GLUCAP 86    Microbiology: Results for orders placed or performed during the hospital encounter of 05/08/16  Urine culture     Status: Abnormal   Collection Time: 05/08/16 12:55 PM  Result Value Ref Range Status   Specimen Description URINE, RANDOM  Final   Special Requests NONE  Final   Culture (A)  Final    <10,000 COLONIES/mL INSIGNIFICANT GROWTH Performed at [facility]    Report Status 05/09/2016 FINAL  Final  Blood culture (routine x 2)     Status: None (Preliminary result)   Collection Time: 05/08/16  1:50 PM  Result Value Ref Range Status   Specimen Description BLOOD RIGHT ASSIST CONTROL  Final   Special Requests BOTTLES DRAWN AEROBIC AND ANAEROBIC 7CCAERO,5CCANA  Final   Culture NO GROWTH 3 DAYS  Final   Report Status PENDING  Incomplete  Blood culture (routine x 2)     Status: None (Preliminary result)   Collection Time: 05/08/16  2:00 PM  Result Value Ref Range Status   Specimen Description BLOOD  Final   Special Requests BOTTLES DRAWN AEROBIC AND ANAEROBIC 1CCAERO,1CCANA  Final   Culture NO GROWTH 3 DAYS  Final   Report Status PENDING  Incomplete    Coagulation Studies: No results for input(s): LABPROT, INR in the last 72 hours.  Urinalysis:  Recent Labs  05/08/16 1255  COLORURINE YELLOW*  LABSPEC 1.014  PHURINE 5.0  GLUCOSEU NEGATIVE  HGBUR NEGATIVE  BILIRUBINUR NEGATIVE  KETONESUR NEGATIVE  PROTEINUR 100*  NITRITE NEGATIVE  LEUKOCYTESUR TRACE*      Imaging: No results found.   Medications:     . albuterol  2.5 mg Nebulization BH-q7a  . allopurinol  100 mg Oral BH-q7a  . amLODipine  10 mg Oral BH-q7a  . aspirin EC  81 mg Oral BH-q7a  . cefTRIAXone  (ROCEPHIN)  IV  1 g Intravenous Q24H  . cloNIDine  0.2 mg Oral BID  . docusate sodium  100 mg Oral BID  . ferrous sulfate  325 mg Oral BH-q7a  . FLUoxetine  10 mg Oral BH-q7a  . gabapentin  100 mg Oral QHS  . heparin  5,000 Units Subcutaneous Q8H  . hydrALAZINE  100 mg Oral TID  . lisinopril  20 mg Oral BH-q7a  . nicotine  21 mg Transdermal Daily  . NIFEdipine  60 mg Oral BH-q7a  . senna  1 tablet Oral BID  . sodium chloride flush  3 mL Intravenous Q12H   sodium chloride, acetaminophen **OR** acetaminophen, albuterol, bisacodyl, hydrALAZINE, ondansetron **OR** ondansetron (ZOFRAN) IV, polyethylene glycol, sodium chloride flush  Assessment/ Plan:  82 y.o.black male with hypertension, gout, anemia, depression, COPD and tobacco abuse   [facility]   1. End-stage renal disease [facility]. Started on dialysis on 8/14.   Continue TTS schedule. Today, patient wants to continue dialysis Will arrange for dialysis today  2. Anemia of chronic kidney disease: with iron deficiency. PRBC transfusion done on  8/26. - epo with hemodialysis treatment   3. Secondary hyperparathyroidism: PTH 310, phos and calcium at goal - not on any binders or vitamin D agents.  - monitor phos level      LOS: 0 Hagen Tidd 8/29/201710:40 AM

## 2016-05-11 NOTE — Progress Notes (Signed)
Clinical Social Worker was informed that patient will be medically ready to discharge to [facility]. Patient's sister and patient are  in a agreement with plan. CSW called Jomarie LongsJoseph- Admissions Coordinator at [facility] to confirm that patient's bed is ready. Provided patient's room number 605 and number to call for report [address] RN [phone] . All discharge information faxed to [facility] via [facility]. DNR added to discharge packet.   Call to patient's sister, informed her patient would discharge to [facility]. RN will call report and patient will discharge to [facility] via EMS  Woodroe Modehristina Ahmet Schank, MSW, LCSW, LCAS-A Clinical Social Worker [phone]

## 2016-05-11 NOTE — Care Management (Signed)
Patient [facility]. Notifed them that patient would be discharging to SNF.  RNCM signing off

## 2016-05-11 NOTE — Discharge Summary (Signed)
[facility] [place] [facility]   PATIENT NAME: Eric HalsJohn Chiang    MR#:  [id number]  DATE OF BIRTH:  [birth year]  DATE OF ADMISSION:  05/08/2016   ADMITTING PHYSICIAN: Milagros LollSrikar Sudini, MD  DATE OF DISCHARGE: 05/11/16  PRIMARY CARE PHYSICIAN: Lauro RegulusANDERSON,MARSHALL W., MD   ADMISSION DIAGNOSIS:   Generalized weakness [R53.1] Symptomatic anemia [D64.9] Abdominal pain, unspecified abdominal location [R10.9]  DISCHARGE DIAGNOSIS:   Active Problems:   Symptomatic anemia   Generalized weakness   Palliative care by specialist   DNR (do not resuscitate)   End stage renal disease (HCC)   SECONDARY DIAGNOSIS:   Past Medical History:  Diagnosis Date  . Chronic kidney disease   . Depression   . Gout   . Hypertension   . Iron deficiency     HOSPITAL COURSE:   82 y/o M with PMH of HTN, ESRD started on hemodialysis last week, anemia of chronic disease presents from home secondary to weakness, dizziness and constipation.  #1 Weakness- from anemia - s/p transfusion, generalized weakness- will need rehab - PT consult appreciated - no dizziness further  #2 Acute on chronic anemia- anemia of chronic disease - s/p 1unit Tx, stool occult negative- appreciate GI input - stable hb, no active bleeding, no further GI needs at this time -  Epo with dialysis  #3 ESRD- on hemodialysis- on Tue-Thur-Sat schedule, due for dialysis today Nephrology consulted  #4 Constipation- laxatives prn  #5 UTI- cultures negative, finished rocephin  #7 HTN- norvasc, clonidine, hydralazine, lisinopril, procardia  Possible discharge to rehab today Appreciate palliative care consult. Patient agreed to continue dialysis at this time. Will need palliative care follow-up at the rehabilitation. Patient is a DO NOT RESUSCITATE  DISCHARGE CONDITIONS:   Guarded  CONSULTS OBTAINED:   Treatment Team:  Lamont DowdySarath Kolluru, MD Midge Miniumarren Wohl, MD  DRUG ALLERGIES:   Allergies  Allergen  Reactions  . Cephalexin     Other reaction(s): Other (See Comments) Gross hematuria - occurred once. Not certain whether it was coincidence or causative.   DISCHARGE MEDICATIONS:     Medication List    TAKE these medications   acetaminophen 500 MG tablet Commonly known as:  TYLENOL Take 1 tablet (500 mg total) by mouth every 6 (six) hours as needed for mild pain, fever or headache. What changed:  medication strength  how much to take  when to take this  reasons to take this   albuterol 108 (90 Base) MCG/ACT inhaler Commonly known as:  PROVENTIL HFA;VENTOLIN HFA Inhale 2 puffs into the lungs every morning.   allopurinol 100 MG tablet Commonly known as:  ZYLOPRIM Take 100 mg by mouth every morning.   amLODipine 10 MG tablet Commonly known as:  NORVASC Take 10 mg by mouth every morning.   aspirin EC 81 MG tablet Take 81 mg by mouth every morning.   cholecalciferol 1000 units tablet Commonly known as:  VITAMIN D Take 1,000 Units by mouth every morning.   cloNIDine 0.2 MG tablet Commonly known as:  CATAPRES Take 0.2 mg by mouth 2 (two) times daily.   ferrous sulfate 325 (65 FE) MG tablet Take 325 mg by mouth every morning.   FLUoxetine 10 MG capsule Commonly known as:  PROZAC Take 1 capsule (10 mg total) by mouth daily. What changed:  when to take this   gabapentin 100 MG capsule Commonly known as:  NEURONTIN Take 1 capsule (100 mg total) by mouth at bedtime.   hydrALAZINE 100 MG  tablet Commonly known as:  APRESOLINE Take 100 mg by mouth 3 (three) times daily.   lisinopril 20 MG tablet Commonly known as:  PRINIVIL,ZESTRIL Take 20 mg by mouth every morning.   nicotine 21 mg/24hr patch Commonly known as:  NICODERM CQ - dosed in mg/24 hours Place 21 mg onto the skin daily.   NIFEdipine 60 MG 24 hr tablet Commonly known as:  PROCARDIA-XL/ADALAT CC Take 60 mg by mouth every morning.   polyethylene glycol packet Commonly known as:  MIRALAX /  GLYCOLAX Take 17 g by mouth daily as needed for mild constipation.   senna-docusate 8.6-50 MG tablet Commonly known as:  Senokot-S Take 2 tablets by mouth daily.        DISCHARGE INSTRUCTIONS:   1. Tuesday-Thursday-Saturday dialysis per schedule 2. PCP follow-up in 1-2 weeks 3. Palliative care follow-up  DIET:   Renal diet  ACTIVITY:   Activity as tolerated  OXYGEN:   Home Oxygen: No.  Oxygen Delivery: room air  DISCHARGE LOCATION:   nursing home   If you experience worsening of your admission symptoms, develop shortness of breath, life threatening emergency, suicidal or homicidal thoughts you must seek medical attention immediately by calling 911 or calling your MD immediately  if symptoms less severe.  You Must read complete instructions/literature along with all the possible adverse reactions/side effects for all the Medicines you take and that have been prescribed to you. Take any new Medicines after you have completely understood and accpet all the possible adverse reactions/side effects.   Please note  You were cared for by a hospitalist during your hospital stay. If you have any questions about your discharge medications or the care you received while you were in the hospital after you are discharged, you can call the unit and asked to speak with the hospitalist on call if the hospitalist that took care of you is not available. Once you are discharged, your primary care physician will handle any further medical issues. Please note that NO REFILLS for any discharge medications will be authorized once you are discharged, as it is imperative that you return to your primary care physician (or establish a relationship with a primary care physician if you do not have one) for your aftercare needs so that they can reassess your need for medications and monitor your lab values.    On the day of Discharge:  VITAL SIGNS:   Blood pressure (!) 155/75, pulse 83, temperature  98.5 F (36.9 C), temperature source Oral, resp. rate 16, height 5\' 8"  (1.727 m), weight 69.1 kg (152 lb 6.4 oz), SpO2 97 %.  PHYSICAL EXAMINATION:   GENERAL:  82 y.o.-year-old patient lying in the bed with no acute distress.  EYES: Pupils equal, round, reactive to light and accommodation. No scleral icterus. Extraocular muscles intact.  HEENT: Head atraumatic, normocephalic. Oropharynx and nasopharynx clear.  NECK:  Supple, no jugular venous distention. No thyroid enlargement, no tenderness.  LUNGS: Normal breath sounds bilaterally, no wheezing, rales,rhonchi or crepitation. No use of accessory muscles of respiration.  CARDIOVASCULAR: S1, S2 normal. No murmurs, rubs, or gallops. right chest permacath in place. ABDOMEN: Soft, nontender, nondistended. Bowel sounds present. No organomegaly or mass.  EXTREMITIES: No pedal edema, cyanosis, or clubbing.  NEUROLOGIC: Cranial nerves II through XII are intact. Muscle strength 5/5 in all extremities. Sensation intact. Gait not checked. Global weakness PSYCHIATRIC: The patient is alert and oriented x 3. No depression symptoms SKIN: No obvious rash, lesion, or ulcer.   DATA REVIEW:  CBC  Recent Labs Lab 05/10/16 0544  WBC 6.6  HGB 9.2*  HCT 27.4*  PLT 251    Chemistries   Recent Labs Lab 05/09/16 0050  05/11/16 0437  NA 138  < > 137  K 3.3*  < > 4.2  CL 101  < > 100*  CO2 30  < > 26  GLUCOSE 115*  < > 76  BUN 20  < > 37*  CREATININE 2.99*  < > 5.20*  CALCIUM 8.2*  < > 8.5*  AST 17  --   --   ALT 8*  --   --   ALKPHOS 78  --   --   BILITOT 0.5  --   --   < > = values in this interval not displayed.   Microbiology Results  Results for orders placed or performed during the hospital encounter of 05/08/16  Urine culture     Status: Abnormal   Collection Time: 05/08/16 12:55 PM  Result Value Ref Range Status   Specimen Description URINE, RANDOM  Final   Special Requests NONE  Final   Culture (A)  Final    <10,000  COLONIES/mL INSIGNIFICANT GROWTH Performed at [facility]    Report Status 05/09/2016 FINAL  Final  Blood culture (routine x 2)     Status: None (Preliminary result)   Collection Time: 05/08/16  1:50 PM  Result Value Ref Range Status   Specimen Description BLOOD RIGHT ASSIST CONTROL  Final   Special Requests BOTTLES DRAWN AEROBIC AND ANAEROBIC 7CCAERO,5CCANA  Final   Culture NO GROWTH 3 DAYS  Final   Report Status PENDING  Incomplete  Blood culture (routine x 2)     Status: None (Preliminary result)   Collection Time: 05/08/16  2:00 PM  Result Value Ref Range Status   Specimen Description BLOOD  Final   Special Requests BOTTLES DRAWN AEROBIC AND ANAEROBIC 1CCAERO,1CCANA  Final   Culture NO GROWTH 3 DAYS  Final   Report Status PENDING  Incomplete    RADIOLOGY:  No results found.   Management plans discussed with the patient, family and they are in agreement.  CODE STATUS:     Code Status Orders        Start     Ordered   05/11/16 0859  Do not attempt resuscitation (DNR)  Continuous    Question Answer Comment  In the event of cardiac or respiratory ARREST Do not call a "code blue"   In the event of cardiac or respiratory ARREST Do not perform Intubation, CPR, defibrillation or ACLS   In the event of cardiac or respiratory ARREST Use medication by any route, position, wound care, and other measures to relive pain and suffering. May use oxygen, suction and manual treatment of airway obstruction as needed for comfort.      05/11/16 0858    Code Status History    Date Active Date Inactive Code Status Order ID Comments User Context   05/08/2016  1:54 PM 05/11/2016  8:58 AM Full Code [id number]  Milagros Loll, MD ED   04/26/2016  7:06 PM 05/03/2016  6:47 PM Full Code [id number]  Adrian Saran, MD Inpatient      TOTAL TIME TAKING CARE OF THIS PATIENT: 37 minutes.    Enid Baas M.D on 05/11/2016 at 2:24 PM  Between 7am to 6pm - Pager - [phone]  After 6pm go to  [email or web address] - Therapist, nutritional Hospitalists  Office  [phone]  CC:  Primary care physician; Lauro Regulus., MD   Note: This dictation was prepared with Dragon dictation along with smaller phrase technology. Any transcriptional errors that result from this process are unintentional.

## 2016-05-11 NOTE — Progress Notes (Signed)
PRE HD ASSESSMENT 

## 2016-05-11 NOTE — Progress Notes (Signed)
Per Dr. Nemiah CommanderKalisetti give pt clonidine and norvasc prior to dialysis treatment.

## 2016-05-11 NOTE — Progress Notes (Signed)
CSW was informed that patient will received HD today. [facility] He stated that he can accept patient today after dialysis even if it's in the evening. Stated they do not have time where they stop accepting patients. Informed RN.  Woodroe Modehristina Raysa Bosak, MSW, LCSW, LCAS-A Clinical Social Worker [phone]

## 2016-05-11 NOTE — Progress Notes (Signed)
Hd tx end 

## 2016-05-11 NOTE — Evaluation (Signed)
Physical Therapy Evaluation Patient Details Name: Eric Yu MRN: [id number] DOB: Jan 01, 1934 Today's Date: 05/11/2016   History of Present Illness  Pt is a 82 y.o. male presenting with abdominal pain, generalized weakness, cough, constipation, and dizziness.  Hgb initially 7.2 and now pt s/p 1 unit PRBC transfusion.  Pt admitted with acutely worsening chronic anemia and UTI.  Of note, pt with recent admit for acute on chronic kidney disease and initiated on HD (R IJ permcath placed).  PMH includes permcath placement 04/26/16; htn, depression, ARF, gout, ESRD on HD.  Clinical Impression  Prior to recent hospital admissions, pt was ambulating with SPC (using RW since hospital discharge last week).  Pt lives alone in 1 level home with 2 steps to enter with B railings.  Currently pt is SBA supine to sit; CGA to stand with RW; and assist to ambulate 75 feet with RW.  Pt initially requiring min assist intermittently to steady during gait but with distance pt requiring consistent min assist to steady (pt demonstrating increased fatigue and increased difficulty with gait with distance).  Pt also demonstrating increased effort and time to perform all functional mobility.  Pt would benefit from skilled PT to address noted impairments and functional limitations.  Pt does not appear safe to discharge home alone at this time d/t impaired activity tolerance and current assist levels.  Recommend pt discharge to [facility] when medically appropriate.     Follow Up Recommendations SNF    Equipment Recommendations   (Pt already owns RW)    Recommendations for Other Services       Precautions / Restrictions Precautions Precautions: Fall Restrictions Weight Bearing Restrictions: No      Mobility  Bed Mobility Overal bed mobility: Needs Assistance Bed Mobility: Supine to Sit     Supine to sit: Supervision;HOB elevated     General bed mobility comments: increased effort and time to perform on  own  Transfers Overall transfer level: Needs assistance Equipment used: Rolling walker (2 wheeled) Transfers: Sit to/from Stand Sit to Stand: Min guard         General transfer comment: increased effort and time to stand with RW; vc's required for hand and feet placement  Ambulation/Gait Ambulation/Gait assistance: Min guard;Min assist Ambulation Distance (Feet): 75 Feet Assistive device: Rolling walker (2 wheeled) Gait Pattern/deviations: Narrow base of support;Decreased step length - right;Decreased step length - left Gait velocity: decreased   General Gait Details: Pt with increasing trunk flexion with distance pushing RW too far ahead of him and having difficulty staying in RW with turns requiring assist to steady, for safety, and for RW placement/navigation; increasing fatigue noted with distance  Stairs            Wheelchair Mobility    Modified Rankin (Stroke Patients Only)       Balance Overall balance assessment: Needs assistance Sitting-balance support: Bilateral upper extremity supported;Feet supported Sitting balance-Leahy Scale: Good     Standing balance support: Bilateral upper extremity supported (on RW) Standing balance-Leahy Scale: Fair Standing balance comment: required B UE support on RW to maintain balance                             Pertinent Vitals/Pain Pain Assessment: No/denies pain  HR 82-102 bpm and O2 >96% on room air during session.    Home Living Family/patient expects to be discharged to:: Private residence Living Arrangements: Alone   Type of Home: House Home Access: Stairs  to enter Entrance Stairs-Rails: Right;Left;Can reach both Entrance Stairs-Number of Steps: 2 Home Layout: One level Home Equipment: Walker - 2 wheels;Cane - single point;Toilet riser      Prior Function Level of Independence: Independent with assistive device(s)         Comments: Pt has been using RW since recent hospital admission but  using SPC prior to that.  Pt denies any falls in past 6 months.  Pt reports his sister and friend assist him with cleaning home.     Hand Dominance        Extremity/Trunk Assessment   Upper Extremity Assessment: Generalized weakness           Lower Extremity Assessment: Generalized weakness         Communication   Communication: No difficulties  Cognition Arousal/Alertness: Awake/alert Behavior During Therapy: WFL for tasks assessed/performed Overall Cognitive Status: Within Functional Limits for tasks assessed                      General Comments General comments (skin integrity, edema, etc.): Pt laying in bed resting upon arrival.  Pt agreeable to PT session.    Exercises General Exercises - Lower Extremity Ankle Circles/Pumps: AROM;Strengthening;Both;10 reps;Supine Quad Sets: AROM;Strengthening;Both;10 reps;Supine Short Arc Quad: AROM;Strengthening;Both;10 reps;Supine Heel Slides: AAROM;Strengthening;Both;10 reps;Supine Hip ABduction/ADduction: AAROM;Strengthening;Both;10 reps;Supine      Assessment/Plan    PT Assessment Patient needs continued PT services  PT Diagnosis Difficulty walking;Generalized weakness   PT Problem List Decreased strength;Decreased activity tolerance;Decreased balance;Decreased mobility  PT Treatment Interventions DME instruction;Gait training;Stair training;Functional mobility training;Therapeutic activities;Therapeutic exercise;Balance training;Patient/family education   PT Goals (Current goals can be found in the Care Plan section) Acute Rehab PT Goals Patient Stated Goal: to get stronger PT Goal Formulation: With patient Time For Goal Achievement: 05/25/16 Potential to Achieve Goals: Good    Frequency Min 2X/week   Barriers to discharge Decreased caregiver support      Co-evaluation               End of Session Equipment Utilized During Treatment: Gait belt Activity Tolerance: Patient limited by  fatigue Patient left: in chair;with call bell/phone within reach;with chair alarm set Nurse Communication: Mobility status;Precautions    Functional Assessment Tool Used: AM-PAC without stairs Functional Limitation: Mobility: Walking and moving around Mobility: Walking and Moving Around Current Status [phone]): At least 40 percent but less than 60 percent impaired, limited or restricted Mobility: Walking and Moving Around Goal Status [phone](G8979): At least 1 percent but less than 20 percent impaired, limited or restricted    Time: [id number] PT Time Calculation (min) (ACUTE ONLY): 27 min   Charges:   PT Evaluation $PT Eval Low Complexity: 1 Procedure PT Treatments $Therapeutic Exercise: 8-22 mins   PT G Codes:   PT G-Codes **NOT FOR INPATIENT CLASS** Functional Assessment Tool Used: AM-PAC without stairs Functional Limitation: Mobility: Walking and moving around Mobility: Walking and Moving Around Current Status ([phone]([id number]): At least 40 percent but less than 60 percent impaired, limited or restricted Mobility: Walking and Moving Around Goal Status [phone]([phone]): At least 1 percent but less than 20 percent impaired, limited or restricted    Hendricks Limesmily Lincon Sahlin 05/11/2016, 11:22 AM Hendricks LimesEmily Theophil Thivierge, PT [phone]

## 2016-05-11 NOTE — Progress Notes (Signed)
Pre hd info 

## 2016-05-11 NOTE — Progress Notes (Signed)
Pt discharged to [facility] via EMS. Report was called on previous shift. VSS. IV's D/c'd with catheter intact.

## 2016-05-11 NOTE — Progress Notes (Signed)
CSW presented bed offers to patient. Accepted bed at [facility]. CSW informed Jomarie LongsJoseph- Admissions Coordinator at [facility]. CSW informed patient's sister. Patient to discharge today pending [employer] auth. MD informed of above.   Woodroe Modehristina Damauri Minion, MSW, LCSW, LCAS-A Clinical Social Worker [phone]

## 2016-05-11 NOTE — Progress Notes (Signed)
Called Report to Kim at [facility]

## 2016-05-11 NOTE — Progress Notes (Signed)
CSW spoke to MD. Patient medically stable to discharge today. CSW faxed in SNF auth [facility]. Awaiting Auth.   Woodroe Modehristina Raegen Tarpley, MSW, LCSW, LCAS-A Clinical Social Worker [phone]

## 2016-05-11 NOTE — Progress Notes (Signed)
POST HD ASSESSMENT 

## 2016-05-11 NOTE — Progress Notes (Signed)
Hd start 

## 2016-05-11 NOTE — Progress Notes (Signed)
CSW [facility] from [facility] [employer] [facility] case manager Danne Baxter(Jill Williams) . She requested notes about patient's ability to complete his ADL's from RN. CSW attempted to return her phone call [phone] ext [phone]. Left vmail. Awaiting phone call back.  Woodroe Modehristina Yunis Voorheis, MSW, LCSW, LCAS-A Clinical Social Worker [phone]

## 2016-05-11 NOTE — Progress Notes (Signed)
Pt is alert and oriented. Pt is able to feed own self. Pt is able to assist but needs help with bathing, dressing, and ambulating. Pt is able to express needs of ADL's.Otilio JeffersonMadelyn S Fenton, RN

## 2016-05-11 NOTE — Progress Notes (Signed)
[facility] [employer] [facility]. Auth number [id number]. Informed Jomarie LongsJoseph- Admissions Coordinator. CSW will continue to follow and assist.  Woodroe Modehristina Cordaro Mukai, MSW, LCSW, LCAS-A Clinical Social Worker [phone]

## 2016-05-11 NOTE — Progress Notes (Signed)
CSW requested RN put in a note about patient's ability to complete ADL's to be [facility] to [facility] insurance [facility].   Woodroe Modehristina Dayron Odland, MSW, LCSW, LCAS-A Clinical Social Worker [phone]

## 2016-05-13 LAB — CULTURE, BLOOD (ROUTINE X 2)
Culture: NO GROWTH
Culture: NO GROWTH

## 2016-06-14 ENCOUNTER — Other Ambulatory Visit: Payer: Self-pay | Admitting: Vascular Surgery

## 2016-06-14 ENCOUNTER — Encounter (INDEPENDENT_AMBULATORY_CARE_PROVIDER_SITE_OTHER): Payer: Medicare HMO

## 2016-06-14 ENCOUNTER — Encounter (INDEPENDENT_AMBULATORY_CARE_PROVIDER_SITE_OTHER): Payer: Medicare HMO | Admitting: Vascular Surgery

## 2016-06-21 ENCOUNTER — Inpatient Hospital Stay: Admission: RE | Admit: 2016-06-21 | Payer: Medicare HMO | Source: Ambulatory Visit

## 2016-06-23 ENCOUNTER — Inpatient Hospital Stay: Admission: RE | Admit: 2016-06-23 | Payer: Medicare HMO | Source: Ambulatory Visit

## 2016-06-30 ENCOUNTER — Encounter (INDEPENDENT_AMBULATORY_CARE_PROVIDER_SITE_OTHER): Payer: Self-pay

## 2016-07-01 ENCOUNTER — Other Ambulatory Visit: Payer: Medicare HMO

## 2016-07-05 ENCOUNTER — Ambulatory Visit (INDEPENDENT_AMBULATORY_CARE_PROVIDER_SITE_OTHER): Payer: Medicare HMO | Admitting: Vascular Surgery

## 2016-07-07 ENCOUNTER — Telehealth (INDEPENDENT_AMBULATORY_CARE_PROVIDER_SITE_OTHER): Payer: Self-pay

## 2016-07-07 NOTE — Telephone Encounter (Signed)
Patient called wanting to know when he was scheduled for pre-op and when his surgery was to take place. I informed the patient his pre-op is schedule for 07/14/16 @ 10:30am and his surgery is scheduled for 07/23/16.

## 2016-07-14 ENCOUNTER — Encounter
Admission: RE | Admit: 2016-07-14 | Discharge: 2016-07-14 | Disposition: A | Discharge status: Home / Self Care | Payer: Medicare HMO | Source: Ambulatory Visit | Attending: Vascular Surgery | Admitting: Vascular Surgery

## 2016-07-14 DIAGNOSIS — Z01818 Encounter for other preprocedural examination: Secondary | ICD-10-CM | POA: Insufficient documentation

## 2016-07-14 HISTORY — DX: Anemia, unspecified: D64.9

## 2016-07-14 HISTORY — DX: Chronic obstructive pulmonary disease, unspecified: J44.9

## 2016-07-14 LAB — BASIC METABOLIC PANEL
ANION GAP: 9 (ref 5–15)
BUN: 19 mg/dL (ref 6–20)
CALCIUM: 9.2 mg/dL (ref 8.9–10.3)
CO2: 25 mmol/L (ref 22–32)
Chloride: 100 mmol/L — ABNORMAL LOW (ref 101–111)
Creatinine, Ser: 2.94 mg/dL — ABNORMAL HIGH (ref 0.61–1.24)
GFR calc Af Amer: 22 mL/min — ABNORMAL LOW (ref >60)
GFR, EST NON AFRICAN AMERICAN: 19 mL/min — AB (ref >60)
Glucose, Bld: 80 mg/dL (ref 65–99)
POTASSIUM: 3.5 mmol/L (ref 3.5–5.1)
SODIUM: 134 mmol/L — AB (ref 135–145)

## 2016-07-14 LAB — PROTIME-INR
INR: 1.1
Prothrombin Time: 14.2 seconds (ref 11.4–15.2)

## 2016-07-14 LAB — TYPE AND SCREEN
ABO/RH(D): A POS
ANTIBODY SCREEN: NEGATIVE
Extend sample reason: TRANSFUSED

## 2016-07-14 LAB — CBC WITH DIFFERENTIAL/PLATELET
BASOS ABS: 0.1 10*3/uL (ref 0–0.1)
BASOS PCT: 2 %
EOS PCT: 2 %
Eosinophils Absolute: 0.1 10*3/uL (ref 0–0.7)
HCT: 28.2 % — ABNORMAL LOW (ref 40.0–52.0)
Hemoglobin: 9.4 g/dL — ABNORMAL LOW (ref 13.0–18.0)
LYMPHS PCT: 18 %
Lymphs Abs: 0.8 10*3/uL — ABNORMAL LOW (ref 1.0–3.6)
MCH: 29.5 pg (ref 26.0–34.0)
MCHC: 33.3 g/dL (ref 32.0–36.0)
MCV: 88.3 fL (ref 80.0–100.0)
Monocytes Absolute: 0.4 10*3/uL (ref 0.2–1.0)
Monocytes Relative: 8 %
NEUTROS ABS: 3.3 10*3/uL (ref 1.4–6.5)
Neutrophils Relative %: 70 %
PLATELETS: 222 10*3/uL (ref 150–440)
RBC: 3.2 MIL/uL — AB (ref 4.40–5.90)
RDW: 16.9 % — AB (ref 11.5–14.5)
WBC: 4.7 10*3/uL (ref 3.8–10.6)

## 2016-07-14 LAB — APTT: APTT: 41 s — AB (ref 24–36)

## 2016-07-14 NOTE — Patient Instructions (Signed)
Your procedure is scheduled on: Friday 07/23/16 Report to Day Surgery. 2ND FLOOR MEDICAL MALL ENTRANCE To find out your arrival time please call [phone] between 1PM - 3PM on Thursday 07/22/16.  Remember: Instructions that are not followed completely may result in serious medical risk, up to and including death, or upon the discretion of your surgeon and anesthesiologist your surgery may need to be rescheduled.    __X__ 1. Do not eat food or drink liquids after midnight. No gum chewing or hard candies.     __X__ 2. No Alcohol for 24 hours before or after surgery.   ____ 3. Bring all medications with you on the day of surgery if instructed.    __X__ 4. Notify your doctor if there is any change in your medical condition     (cold, fever, infections).     Do not wear jewelry, make-up, hairpins, clips or nail polish.  Do not wear lotions, powders, or perfumes.   Do not shave 48 hours prior to surgery. Men may shave face and neck.  Do not bring valuables to the hospital.    [facility] is not responsible for any belongings or valuables.               Contacts, dentures or bridgework may not be worn into surgery.  Leave your suitcase in the car. After surgery it may be brought to your room.  For patients admitted to the hospital, discharge time is determined by your                treatment team.   Patients discharged the day of surgery will not be allowed to drive home.   Please read over the following fact sheets that you were given:   Pain Booklet and MRSA Information   __X__ Take these medicines the morning of surgery with A SIP OF WATER:    1. AMLODIPINE  2. CLONIDINE  3.   4.  5.  6.  ____ Fleet Enema (as directed)   __X__ Use CHG Soap as directed/SAGE WIPES  __X__ Use inhalers on the day of surgery  ____ Stop metformin 2 days prior to surgery    ____ Take 1/2 of usual insulin dose the night before surgery and none on the morning of surgery.   ____ Stop  Coumadin/Plavix/aspirin on   ____ Stop Anti-inflammatories on    ____ Stop supplements until after surgery.    ____ Bring C-Pap to the hospital.

## 2016-07-23 ENCOUNTER — Encounter
Admission: RE | Disposition: A | Discharge status: Home / Self Care | Payer: Self-pay | Source: Ambulatory Visit | Attending: Vascular Surgery

## 2016-07-23 ENCOUNTER — Ambulatory Visit: Payer: Medicare HMO | Admitting: Anesthesiology

## 2016-07-23 ENCOUNTER — Encounter: Payer: Self-pay | Admitting: Anesthesiology

## 2016-07-23 ENCOUNTER — Ambulatory Visit
Admission: RE | Admit: 2016-07-23 | Discharge: 2016-07-23 | Disposition: A | Discharge status: Home / Self Care | Payer: Medicare HMO | Source: Ambulatory Visit | Attending: Vascular Surgery | Admitting: Vascular Surgery

## 2016-07-23 DIAGNOSIS — M109 Gout, unspecified: Secondary | ICD-10-CM | POA: Insufficient documentation

## 2016-07-23 DIAGNOSIS — M170 Bilateral primary osteoarthritis of knee: Secondary | ICD-10-CM | POA: Diagnosis not present

## 2016-07-23 DIAGNOSIS — D631 Anemia in chronic kidney disease: Secondary | ICD-10-CM | POA: Diagnosis not present

## 2016-07-23 DIAGNOSIS — Z6822 Body mass index (BMI) 22.0-22.9, adult: Secondary | ICD-10-CM | POA: Diagnosis not present

## 2016-07-23 DIAGNOSIS — F329 Major depressive disorder, single episode, unspecified: Secondary | ICD-10-CM | POA: Diagnosis not present

## 2016-07-23 DIAGNOSIS — E441 Mild protein-calorie malnutrition: Secondary | ICD-10-CM | POA: Insufficient documentation

## 2016-07-23 DIAGNOSIS — J449 Chronic obstructive pulmonary disease, unspecified: Secondary | ICD-10-CM | POA: Insufficient documentation

## 2016-07-23 DIAGNOSIS — Z7982 Long term (current) use of aspirin: Secondary | ICD-10-CM | POA: Insufficient documentation

## 2016-07-23 DIAGNOSIS — N186 End stage renal disease: Secondary | ICD-10-CM | POA: Insufficient documentation

## 2016-07-23 DIAGNOSIS — F17210 Nicotine dependence, cigarettes, uncomplicated: Secondary | ICD-10-CM | POA: Diagnosis not present

## 2016-07-23 DIAGNOSIS — I1311 Hypertensive heart and chronic kidney disease without heart failure, with stage 5 chronic kidney disease, or end stage renal disease: Secondary | ICD-10-CM | POA: Diagnosis not present

## 2016-07-23 DIAGNOSIS — N2581 Secondary hyperparathyroidism of renal origin: Secondary | ICD-10-CM | POA: Insufficient documentation

## 2016-07-23 DIAGNOSIS — I120 Hypertensive chronic kidney disease with stage 5 chronic kidney disease or end stage renal disease: Secondary | ICD-10-CM | POA: Diagnosis present

## 2016-07-23 HISTORY — PX: AV FISTULA PLACEMENT: SHX1204

## 2016-07-23 LAB — TYPE AND SCREEN
ABO/RH(D): A POS
ANTIBODY SCREEN: NEGATIVE

## 2016-07-23 LAB — SURGICAL PCR SCREEN
MRSA, PCR: NEGATIVE
Staphylococcus aureus: NEGATIVE

## 2016-07-23 LAB — POCT I-STAT 4, (NA,K, GLUC, HGB,HCT)
Glucose, Bld: 81 mg/dL (ref 65–99)
HCT: 31 % — ABNORMAL LOW (ref 39.0–52.0)
Hemoglobin: 10.5 g/dL — ABNORMAL LOW (ref 13.0–17.0)
POTASSIUM: 4.5 mmol/L (ref 3.5–5.1)
SODIUM: 139 mmol/L (ref 135–145)

## 2016-07-23 SURGERY — INSERTION OF ARTERIOVENOUS (AV) GORE-TEX GRAFT ARM
Anesthesia: General | Laterality: Left | Wound class: Clean

## 2016-07-23 MED ORDER — FAMOTIDINE 20 MG PO TABS
ORAL_TABLET | ORAL | Status: AC
  Filled 2016-07-23: qty 1

## 2016-07-23 MED ORDER — HEPARIN SODIUM (PORCINE) 5000 UNIT/ML IJ SOLN
INTRAMUSCULAR | Status: AC
  Filled 2016-07-23: qty 1

## 2016-07-23 MED ORDER — SODIUM CHLORIDE 0.9 % IV SOLN
INTRAVENOUS | Status: DC
  Administered 2016-07-23: 09:00:00 via INTRAVENOUS

## 2016-07-23 MED ORDER — SUCCINYLCHOLINE CHLORIDE 200 MG/10ML IV SOSY
PREFILLED_SYRINGE | INTRAVENOUS | Status: DC | PRN
  Administered 2016-07-23: 70 mg via INTRAVENOUS

## 2016-07-23 MED ORDER — CLINDAMYCIN PHOSPHATE 300 MG/50ML IV SOLN
INTRAVENOUS | Status: AC
  Filled 2016-07-23: qty 50

## 2016-07-23 MED ORDER — SODIUM CHLORIDE 0.9 % IV SOLN
INTRAVENOUS | Status: DC | PRN
  Administered 2016-07-23: 100 mL via INTRAMUSCULAR

## 2016-07-23 MED ORDER — PHENYLEPHRINE HCL 10 MG/ML IJ SOLN
INTRAMUSCULAR | Status: DC | PRN
  Administered 2016-07-23: 50 ug/min via INTRAVENOUS

## 2016-07-23 MED ORDER — FENTANYL CITRATE (PF) 100 MCG/2ML IJ SOLN: 25.0000 ug | INTRAMUSCULAR | Status: DC | PRN

## 2016-07-23 MED ORDER — LIDOCAINE HCL (CARDIAC) 20 MG/ML IV SOLN
INTRAVENOUS | Status: DC | PRN
  Administered 2016-07-23: 12.5 mg via INTRAVENOUS
  Administered 2016-07-23: 25 mg via INTRAVENOUS

## 2016-07-23 MED ORDER — EPHEDRINE SULFATE 50 MG/ML IJ SOLN
INTRAMUSCULAR | Status: DC | PRN
  Administered 2016-07-23 (×2): 10 mg via INTRAVENOUS

## 2016-07-23 MED ORDER — BUPIVACAINE HCL (PF) 0.5 % IJ SOLN
INTRAMUSCULAR | Status: DC | PRN
  Administered 2016-07-23: 30 mL

## 2016-07-23 MED ORDER — MIDAZOLAM HCL 2 MG/2ML IJ SOLN
INTRAMUSCULAR | Status: DC | PRN
  Administered 2016-07-23: 1 mg via INTRAVENOUS

## 2016-07-23 MED ORDER — ONDANSETRON HCL 4 MG/2ML IJ SOLN: 4.0000 mg | Freq: Once | INTRAMUSCULAR | Status: DC | PRN

## 2016-07-23 MED ORDER — BUPIVACAINE HCL (PF) 0.5 % IJ SOLN
INTRAMUSCULAR | Status: AC
  Filled 2016-07-23: qty 30

## 2016-07-23 MED ORDER — GLYCOPYRROLATE 0.2 MG/ML IJ SOLN
INTRAMUSCULAR | Status: DC | PRN
  Administered 2016-07-23: 0.2 mg via INTRAVENOUS

## 2016-07-23 MED ORDER — FAMOTIDINE 20 MG PO TABS
20.0000 mg | ORAL_TABLET | Freq: Once | ORAL | Status: AC
Start: 2016-07-23 — End: 2016-07-23
  Administered 2016-07-23: 20 mg via ORAL

## 2016-07-23 MED ORDER — PHENYLEPHRINE HCL 10 MG/ML IJ SOLN
INTRAMUSCULAR | Status: DC | PRN
  Administered 2016-07-23: 100 ug via INTRAVENOUS

## 2016-07-23 MED ORDER — ONDANSETRON HCL 4 MG/2ML IJ SOLN
INTRAMUSCULAR | Status: DC | PRN
  Administered 2016-07-23: 4 mg via INTRAVENOUS

## 2016-07-23 MED ORDER — CLINDAMYCIN PHOSPHATE 300 MG/50ML IV SOLN
300.0000 mg | Freq: Once | INTRAVENOUS | Status: AC
  Administered 2016-07-23: 300 mg via INTRAVENOUS

## 2016-07-23 MED ORDER — PROPOFOL 10 MG/ML IV BOLUS
INTRAVENOUS | Status: DC | PRN
  Administered 2016-07-23: 50 mg via INTRAVENOUS
  Administered 2016-07-23: 100 mg via INTRAVENOUS

## 2016-07-23 MED ORDER — DEXAMETHASONE SODIUM PHOSPHATE 10 MG/ML IJ SOLN
INTRAMUSCULAR | Status: DC | PRN
  Administered 2016-07-23: 10 mg via INTRAVENOUS

## 2016-07-23 MED ORDER — CHLORHEXIDINE GLUCONATE CLOTH 2 % EX PADS: 6.0000 | MEDICATED_PAD | Freq: Once | CUTANEOUS | Status: DC

## 2016-07-23 MED ORDER — FENTANYL CITRATE (PF) 100 MCG/2ML IJ SOLN
INTRAMUSCULAR | Status: DC | PRN
  Administered 2016-07-23: 50 ug via INTRAVENOUS
  Administered 2016-07-23 (×2): 25 ug via INTRAVENOUS

## 2016-07-23 MED ORDER — HYDROCODONE-ACETAMINOPHEN 5-325 MG PO TABS: 1.0000 | ORAL_TABLET | Freq: Four times a day (QID) | ORAL | 0 refills | Status: DC | PRN

## 2016-07-23 SURGICAL SUPPLY — 58 items
APPLIER CLIP 11 MED OPEN (CLIP)
APPLIER CLIP 9.375 SM OPEN (CLIP)
BAG COUNTER SPONGE EZ (MISCELLANEOUS) IMPLANT
BAG DECANTER FOR FLEXI CONT (MISCELLANEOUS) ×3 IMPLANT
BLADE SURG SZ11 CARB STEEL (BLADE) ×3 IMPLANT
BOOT SUTURE AID YELLOW STND (SUTURE) ×3 IMPLANT
BRUSH SCRUB 4% CHG (MISCELLANEOUS) ×3 IMPLANT
CANISTER SUCT 1200ML W/VALVE (MISCELLANEOUS) ×3 IMPLANT
CHLORAPREP W/TINT 26ML (MISCELLANEOUS) ×3 IMPLANT
CLIP APPLIE 11 MED OPEN (CLIP) IMPLANT
CLIP APPLIE 9.375 SM OPEN (CLIP) IMPLANT
COUNTER SPONGE BAG EZ (MISCELLANEOUS)
DERMABOND ADVANCED (GAUZE/BANDAGES/DRESSINGS) ×2
DERMABOND ADVANCED .7 DNX12 (GAUZE/BANDAGES/DRESSINGS) ×1 IMPLANT
DRESSING SURGICEL FIBRLLR 1X2 (HEMOSTASIS) ×1 IMPLANT
DRSG SURGICEL FIBRILLAR 1X2 (HEMOSTASIS) ×3
ELECT CAUTERY BLADE 6.4 (BLADE) ×3 IMPLANT
ELECT REM PT RETURN 9FT ADLT (ELECTROSURGICAL) ×3
ELECTRODE REM PT RTRN 9FT ADLT (ELECTROSURGICAL) ×1 IMPLANT
GLOVE BIO SURGEON STRL SZ7 (GLOVE) ×6 IMPLANT
GLOVE INDICATOR 7.5 STRL GRN (GLOVE) ×3 IMPLANT
GLOVE SURG SYN 8.0 (GLOVE) ×3 IMPLANT
GOWN STRL REUS W/ TWL LRG LVL3 (GOWN DISPOSABLE) ×1 IMPLANT
GOWN STRL REUS W/ TWL XL LVL3 (GOWN DISPOSABLE) ×1 IMPLANT
GOWN STRL REUS W/TWL LRG LVL3 (GOWN DISPOSABLE) ×2
GOWN STRL REUS W/TWL XL LVL3 (GOWN DISPOSABLE) ×2
GRAFT PROPATEN STD WALL 4 7X45 (Vascular Products) ×3 IMPLANT
IV NS 500ML (IV SOLUTION) ×2
IV NS 500ML BAXH (IV SOLUTION) ×1 IMPLANT
KIT RM TURNOVER STRD PROC AR (KITS) ×3 IMPLANT
LABEL OR SOLS (LABEL) ×3 IMPLANT
LOOP RED MAXI  1X406MM (MISCELLANEOUS) ×2
LOOP VESSEL MAXI 1X406 RED (MISCELLANEOUS) ×1 IMPLANT
LOOP VESSEL MINI 0.8X406 BLUE (MISCELLANEOUS) ×2 IMPLANT
LOOPS BLUE MINI 0.8X406MM (MISCELLANEOUS) ×4
NEEDLE FILTER BLUNT 18X 1/2SAF (NEEDLE) ×2
NEEDLE FILTER BLUNT 18X1 1/2 (NEEDLE) ×1 IMPLANT
NS IRRIG 500ML POUR BTL (IV SOLUTION) IMPLANT
PACK EXTREMITY ARMC (MISCELLANEOUS) ×3 IMPLANT
PAD PREP 24X41 OB/GYN DISP (PERSONAL CARE ITEMS) ×3 IMPLANT
PUNCH SURGICAL ROTATE 2.7MM (MISCELLANEOUS) IMPLANT
STOCKINETTE STRL 4IN 9604848 (GAUZE/BANDAGES/DRESSINGS) ×3 IMPLANT
SUT GTX CV-6 30 (SUTURE) ×6 IMPLANT
SUT MNCRL+ 5-0 UNDYED PC-3 (SUTURE) ×1 IMPLANT
SUT MONOCRYL 5-0 (SUTURE) ×2
SUT PROLENE 6 0 BV (SUTURE) ×12 IMPLANT
SUT SILK 0 SH 30 (SUTURE) ×3 IMPLANT
SUT SILK 2 0 (SUTURE) ×2
SUT SILK 2 0 SH (SUTURE) ×3 IMPLANT
SUT SILK 2-0 18XBRD TIE 12 (SUTURE) ×1 IMPLANT
SUT SILK 3 0 (SUTURE) ×2
SUT SILK 3-0 18XBRD TIE 12 (SUTURE) ×1 IMPLANT
SUT SILK 4 0 (SUTURE) ×2
SUT SILK 4-0 18XBRD TIE 12 (SUTURE) ×1 IMPLANT
SUT VIC AB 3-0 SH 27 (SUTURE) ×4
SUT VIC AB 3-0 SH 27X BRD (SUTURE) ×2 IMPLANT
SYR 20CC LL (SYRINGE) ×3 IMPLANT
SYR 3ML LL SCALE MARK (SYRINGE) ×3 IMPLANT

## 2016-07-23 NOTE — Anesthesia Postprocedure Evaluation (Signed)
Anesthesia Post Note  Patient: Eric Yu  Procedure(s) Performed: Procedure(s) (LRB): INSERTION OF ARTERIOVENOUS (AV) GORE-TEX GRAFT ARM (Left)  Patient location during evaluation: PACU Anesthesia Type: General Level of consciousness: awake and alert Pain management: pain level controlled Vital Signs Assessment: post-procedure vital signs reviewed and stable Respiratory status: spontaneous breathing, nonlabored ventilation, respiratory function stable and patient connected to nasal cannula oxygen Cardiovascular status: blood pressure returned to baseline and stable Postop Assessment: no signs of nausea or vomiting Anesthetic complications: no    Last Vitals:  Vitals:   07/23/16 1235 07/23/16 1242  BP:  (!) 166/61  Pulse: 83   Resp: 14 18  Temp:  36.6 C    Last Pain:  Vitals:   07/23/16 1242  TempSrc: Oral  PainSc: 0-No pain                 Laveyah Oriol S

## 2016-07-23 NOTE — OR Nursing (Signed)
Dialysis catheter in right chest

## 2016-07-23 NOTE — Transfer of Care (Signed)
2Immediate Anesthesia Transfer of Care Note  Patient: Eric HalsJohn Yu  Procedure(s) Performed: Procedure(s): INSERTION OF ARTERIOVENOUS (AV) GORE-TEX GRAFT ARM (Left)  Patient Location: PACU  Anesthesia Type:General  Level of Consciousness: sedated  Airway & Oxygen Therapy: Patient Spontanous Breathing and Patient connected to face mask oxygen  Post-op Assessment: Report given to RN and Post -op Vital signs reviewed and stable  Post vital signs: Reviewed and stable  Last Vitals:  Vitals:   07/23/16 0835  BP: (!) 183/93  Pulse: 70  Resp: 20  Temp: 36.3 C    Last Pain:  Vitals:   07/23/16 0835  TempSrc: Tympanic         Complications: No apparent anesthesia complications

## 2016-07-23 NOTE — H&P (Signed)
[facility] History & Physical Update  The patient was interviewed and re-examined.  The patient's previous History and Physical has been reviewed and is unchanged.  There is no change in the plan of care. We plan to proceed with the scheduled procedure.  Levora DredgeGregory Davanee Klinkner, MD  07/23/2016, 9:25 AM

## 2016-07-23 NOTE — Anesthesia Preprocedure Evaluation (Signed)
Anesthesia Evaluation  Patient identified by MRN, date of birth, ID band Patient awake    Reviewed: Allergy & Precautions, NPO status , Patient's Chart, lab work & pertinent test results, reviewed documented beta blocker date and time   Airway Mallampati: II  TM Distance: >3 FB     Dental  (+) Chipped   Pulmonary COPD, former smoker,           Cardiovascular hypertension, Pt. on medications      Neuro/Psych PSYCHIATRIC DISORDERS Depression    GI/Hepatic   Endo/Other    Renal/GU ESRFRenal disease     Musculoskeletal   Abdominal   Peds  Hematology  (+) anemia ,   Anesthesia Other Findings Hypertensive. Gout. DNR discussed.  Reproductive/Obstetrics                             Anesthesia Physical Anesthesia Plan  ASA: III  Anesthesia Plan: General   Post-op Pain Management:    Induction: Intravenous  Airway Management Planned: LMA  Additional Equipment:   Intra-op Plan:   Post-operative Plan:   Informed Consent: I have reviewed the patients History and Physical, chart, labs and discussed the procedure including the risks, benefits and alternatives for the proposed anesthesia with the patient or authorized representative who has indicated his/her understanding and acceptance.     Plan Discussed with: CRNA  Anesthesia Plan Comments:         Anesthesia Quick Evaluation

## 2016-07-23 NOTE — Discharge Instructions (Signed)

## 2016-07-23 NOTE — Op Note (Signed)
OPERATIVE NOTE   PROCEDURE: left brachial axillary arteriovenous graft placement  PRE-OPERATIVE DIAGNOSIS: End Stage Renal Disease  POST-OPERATIVE DIAGNOSIS: End Stage Renal Disease  SURGEON: Levora DredgeGregory Tari Lecount  ASSISTANT(S): none  ANESTHESIA: general  ESTIMATED BLOOD LOSS: <50 cc  FINDING(S): 6 mm axillary vein  SPECIMEN(S):  none  INDICATIONS:   Eric Yu is a 82 y.o. male who presents with end stage renal disease.  The patient is scheduled for left brachial axillary AV graft placement.  The patient is aware the risks include but are not limited to: bleeding, infection, steal syndrome, nerve damage, ischemic monomelic neuropathy, failure to mature, and need for additional procedures.  The patient is aware of the risks of the procedure and elects to proceed forward.  DESCRIPTION: After full informed written consent was obtained from the patient, the patient was brought back to the operating room and placed supine upon the operating table.  Prior to induction, the patient received IV antibiotics.   After obtaining adequate anesthesia, the patient was then prepped and draped in the standard fashion for a left arm access procedure.    A linear incision was then created along the medial border of the biceps muscle just proximal to the antecubital crease and the brachial artery which was exposed through. The brachial artery was then looped proximally and distally with Silastic Vesseloops. Side branches were controlled with 4-0 silk ties.  Attention was then turned to the exposure of the axillary vein. Linear incision was then created medial to the proximal portion of the biceps at the level of the anterior axillary crease. The axillary vein was exposed and again looped proximally and distally with Silastic vessel loops. Associated tributaries were also controlled with Silastic Vesseloops.  The Gore tunneler was then delivered onto the field and a subcutaneous path was made from the  arterial incision to the venous incision. A 4-7 tapered PTFE propatent graft by Emeline DarlingGore was then pulled through the subcutaneous tunnel. The arterial 4 mm portion was then approximated to the brachial artery. Brachial artery was controlled proximally and distally with the Silastic Vesseloops. Arteriotomy was made with an 11 blade scalpel and extended with Potts scissors and a 6-0 Prolene stay suture was placed. End graft to side brachial artery anastomosis was then fashioned with running CV 6 suture. Flushing maneuvers were performed suture line was hemostatic and the graft was then assessed for proper position and ease of future cannulation. Heparinized saline was infused into the vein and the graft was clamped with a vascular clamp. With the graft pressurized it was approximated to the axillary vein in its native bed and then marked with a surgical marker. The vein was then delivered into the surgical field and controlled with the Silastic vessel loops. Venotomy was then made with an 11 blade scalpel and extended with Potts scissors and a 6-0 Prolene suture was used as stay suture. The the graft was then sewn to the vein in an end graft to side vein fashion using running CV 6 suture.  Flushing maneuvers were performed and the artery was allowed to forward and back bleed.  Flow was then established through the AV graft  There was good  thrill in the venous outflow, and there was 1+ palpable radial pulse.  At this point, I irrigated out the surgical wounds.  There was no further active bleeding.  The subcutaneous tissue was reapproximated with a running stitch of 3-0 Vicryl.  The skin was then reapproximated with a running subcuticular stitch  of 4-0 Vicryl.  The skin was then cleaned, dried, and reinforced with Dermabond.    The patient tolerated this procedure well.   COMPLICATIONS: None  CONDITION: Eric Yu   [name]  Office: [phone]   07/23/2016, 11:41 AM

## 2016-07-23 NOTE — Anesthesia Procedure Notes (Signed)
Procedure Name: Intubation Date/Time: 07/23/2016 9:55 AM Performed by: Marlana SalvageJESSUP, Arjan Strohm Pre-anesthesia Checklist: Patient identified, Emergency Drugs available, Suction available, Patient being monitored and Timeout performed Patient Re-evaluated:Patient Re-evaluated prior to inductionOxygen Delivery Method: Circle system utilized Preoxygenation: Pre-oxygenation with 100% oxygen Intubation Type: IV induction Ventilation: Oral airway inserted - appropriate to patient size and Mask ventilation without difficulty Laryngoscope Size: Mac and 4 Grade View: Grade I Tube type: Oral Tube size: 7.5 mm Number of attempts: 1 (Intubation with oett after unsuccessful placement of LMA 4 and LMA 5--would not seat properly.) Airway Equipment and Method: Stylet Placement Confirmation: ETT inserted through vocal cords under direct vision,  positive ETCO2 and breath sounds checked- equal and bilateral Secured at: 21 cm Tube secured with: Tape Dental Injury: Teeth and Oropharynx as per pre-operative assessment

## 2016-07-29 ENCOUNTER — Encounter: Payer: Self-pay | Admitting: Emergency Medicine

## 2016-07-29 ENCOUNTER — Emergency Department: Payer: Medicare HMO

## 2016-07-29 ENCOUNTER — Inpatient Hospital Stay
Admission: EM | Admit: 2016-07-29 | Discharge: 2016-07-30 | DRG: 640 | Disposition: A | Discharge status: Home Health | Payer: Medicare HMO | Attending: Internal Medicine | Admitting: Internal Medicine

## 2016-07-29 DIAGNOSIS — Z9115 Patient's noncompliance with renal dialysis: Secondary | ICD-10-CM

## 2016-07-29 DIAGNOSIS — F329 Major depressive disorder, single episode, unspecified: Secondary | ICD-10-CM | POA: Diagnosis present

## 2016-07-29 DIAGNOSIS — E875 Hyperkalemia: Secondary | ICD-10-CM | POA: Diagnosis present

## 2016-07-29 DIAGNOSIS — Z881 Allergy status to other antibiotic agents status: Secondary | ICD-10-CM | POA: Diagnosis not present

## 2016-07-29 DIAGNOSIS — R531 Weakness: Secondary | ICD-10-CM

## 2016-07-29 DIAGNOSIS — Z7982 Long term (current) use of aspirin: Secondary | ICD-10-CM

## 2016-07-29 DIAGNOSIS — Z66 Do not resuscitate: Secondary | ICD-10-CM | POA: Diagnosis present

## 2016-07-29 DIAGNOSIS — Z79899 Other long term (current) drug therapy: Secondary | ICD-10-CM | POA: Diagnosis not present

## 2016-07-29 DIAGNOSIS — Z8249 Family history of ischemic heart disease and other diseases of the circulatory system: Secondary | ICD-10-CM

## 2016-07-29 DIAGNOSIS — Z87891 Personal history of nicotine dependence: Secondary | ICD-10-CM | POA: Diagnosis not present

## 2016-07-29 DIAGNOSIS — J449 Chronic obstructive pulmonary disease, unspecified: Secondary | ICD-10-CM | POA: Diagnosis present

## 2016-07-29 DIAGNOSIS — D631 Anemia in chronic kidney disease: Secondary | ICD-10-CM | POA: Diagnosis present

## 2016-07-29 DIAGNOSIS — N186 End stage renal disease: Secondary | ICD-10-CM

## 2016-07-29 DIAGNOSIS — N2581 Secondary hyperparathyroidism of renal origin: Secondary | ICD-10-CM | POA: Diagnosis present

## 2016-07-29 DIAGNOSIS — I120 Hypertensive chronic kidney disease with stage 5 chronic kidney disease or end stage renal disease: Secondary | ICD-10-CM | POA: Diagnosis present

## 2016-07-29 DIAGNOSIS — M6281 Muscle weakness (generalized): Secondary | ICD-10-CM

## 2016-07-29 DIAGNOSIS — Z992 Dependence on renal dialysis: Secondary | ICD-10-CM

## 2016-07-29 DIAGNOSIS — R001 Bradycardia, unspecified: Secondary | ICD-10-CM | POA: Diagnosis present

## 2016-07-29 DIAGNOSIS — M109 Gout, unspecified: Secondary | ICD-10-CM | POA: Diagnosis present

## 2016-07-29 DIAGNOSIS — R0989 Other specified symptoms and signs involving the circulatory and respiratory systems: Secondary | ICD-10-CM | POA: Diagnosis present

## 2016-07-29 LAB — CBC WITH DIFFERENTIAL/PLATELET
Basophils Absolute: 0.1 10*3/uL (ref 0–0.1)
Basophils Relative: 2 %
EOS ABS: 0.1 10*3/uL (ref 0–0.7)
EOS PCT: 1 %
HCT: 30.5 % — ABNORMAL LOW (ref 40.0–52.0)
Hemoglobin: 9.8 g/dL — ABNORMAL LOW (ref 13.0–18.0)
LYMPHS ABS: 0.8 10*3/uL — AB (ref 1.0–3.6)
LYMPHS PCT: 16 %
MCH: 29.1 pg (ref 26.0–34.0)
MCHC: 32 g/dL (ref 32.0–36.0)
MCV: 91 fL (ref 80.0–100.0)
MONO ABS: 0.4 10*3/uL (ref 0.2–1.0)
MONOS PCT: 8 %
Neutro Abs: 3.7 10*3/uL (ref 1.4–6.5)
Neutrophils Relative %: 73 %
PLATELETS: 197 10*3/uL (ref 150–440)
RBC: 3.35 MIL/uL — AB (ref 4.40–5.90)
RDW: 16.4 % — ABNORMAL HIGH (ref 11.5–14.5)
WBC: 5 10*3/uL (ref 3.8–10.6)

## 2016-07-29 LAB — COMPREHENSIVE METABOLIC PANEL
ALT: 10 U/L — AB (ref 17–63)
ANION GAP: 12 (ref 5–15)
AST: 18 U/L (ref 15–41)
Albumin: 3.8 g/dL (ref 3.5–5.0)
Alkaline Phosphatase: 75 U/L (ref 38–126)
BUN: 77 mg/dL — ABNORMAL HIGH (ref 6–20)
CALCIUM: 9 mg/dL (ref 8.9–10.3)
CHLORIDE: 104 mmol/L (ref 101–111)
CO2: 22 mmol/L (ref 22–32)
CREATININE: 7.2 mg/dL — AB (ref 0.61–1.24)
GFR, EST AFRICAN AMERICAN: 7 mL/min — AB (ref >60)
GFR, EST NON AFRICAN AMERICAN: 6 mL/min — AB (ref >60)
Glucose, Bld: 91 mg/dL (ref 65–99)
Potassium: 5.7 mmol/L — ABNORMAL HIGH (ref 3.5–5.1)
SODIUM: 138 mmol/L (ref 135–145)
Total Bilirubin: 0.8 mg/dL (ref 0.3–1.2)
Total Protein: 7.6 g/dL (ref 6.5–8.1)

## 2016-07-29 LAB — GLUCOSE, CAPILLARY: GLUCOSE-CAPILLARY: 124 mg/dL — AB (ref 65–99)

## 2016-07-29 LAB — PHOSPHORUS: PHOSPHORUS: 5.5 mg/dL — AB (ref 2.5–4.6)

## 2016-07-29 LAB — MAGNESIUM: MAGNESIUM: 2.4 mg/dL (ref 1.7–2.4)

## 2016-07-29 LAB — TROPONIN I: TROPONIN I: 0.07 ng/mL — AB (ref <0.03)

## 2016-07-29 MED ORDER — HEPARIN SODIUM (PORCINE) 5000 UNIT/ML IJ SOLN
5000.0000 [IU] | Freq: Three times a day (TID) | INTRAMUSCULAR | Status: DC
  Administered 2016-07-30: 5000 [IU] via SUBCUTANEOUS
  Filled 2016-07-29: qty 1

## 2016-07-29 MED ORDER — HYDROCODONE-ACETAMINOPHEN 5-325 MG PO TABS
1.0000 | ORAL_TABLET | Freq: Four times a day (QID) | ORAL | Status: DC | PRN
Start: 2016-07-29 — End: 2016-07-30

## 2016-07-29 MED ORDER — ONDANSETRON HCL 4 MG/2ML IJ SOLN
4.0000 mg | Freq: Four times a day (QID) | INTRAMUSCULAR | Status: DC | PRN
Start: 2016-07-29 — End: 2016-07-30

## 2016-07-29 MED ORDER — ALBUTEROL SULFATE (2.5 MG/3ML) 0.083% IN NEBU
2.5000 mg | INHALATION_SOLUTION | RESPIRATORY_TRACT | Status: DC
  Administered 2016-07-30: 2.5 mg via RESPIRATORY_TRACT
  Filled 2016-07-29: qty 3

## 2016-07-29 MED ORDER — HYDRALAZINE HCL 20 MG/ML IJ SOLN
10.0000 mg | Freq: Four times a day (QID) | INTRAMUSCULAR | Status: DC | PRN
  Administered 2016-07-29: 10 mg via INTRAVENOUS
  Filled 2016-07-29: qty 1

## 2016-07-29 MED ORDER — SENNOSIDES-DOCUSATE SODIUM 8.6-50 MG PO TABS: 1.0000 | ORAL_TABLET | Freq: Every evening | ORAL | Status: DC | PRN

## 2016-07-29 MED ORDER — SODIUM CHLORIDE 0.9 % IV SOLN
1.0000 g | Freq: Once | INTRAVENOUS | Status: AC
  Administered 2016-07-29: 1 g via INTRAVENOUS
  Filled 2016-07-29: qty 10

## 2016-07-29 MED ORDER — ONDANSETRON HCL 4 MG PO TABS: 4.0000 mg | ORAL_TABLET | Freq: Four times a day (QID) | ORAL | Status: DC | PRN

## 2016-07-29 MED ORDER — SODIUM BICARBONATE 8.4 % IV SOLN
50.0000 meq | Freq: Once | INTRAVENOUS | Status: AC
  Administered 2016-07-29: 50 meq via INTRAVENOUS
  Filled 2016-07-29: qty 50

## 2016-07-29 MED ORDER — INSULIN ASPART 100 UNIT/ML ~~LOC~~ SOLN
10.0000 [IU] | Freq: Once | SUBCUTANEOUS | Status: AC
  Administered 2016-07-29: 10 [IU] via INTRAVENOUS
  Filled 2016-07-29: qty 10

## 2016-07-29 MED ORDER — ALLOPURINOL 100 MG PO TABS
100.0000 mg | ORAL_TABLET | ORAL | Status: DC
  Administered 2016-07-30: 100 mg via ORAL
  Filled 2016-07-29: qty 1

## 2016-07-29 MED ORDER — ASPIRIN EC 81 MG PO TBEC
81.0000 mg | DELAYED_RELEASE_TABLET | ORAL | Status: DC
  Administered 2016-07-30: 81 mg via ORAL
  Filled 2016-07-29: qty 1

## 2016-07-29 MED ORDER — DEXTROSE 50 % IV SOLN
25.0000 g | Freq: Once | INTRAVENOUS | Status: AC
  Administered 2016-07-29: 25 g via INTRAVENOUS
  Filled 2016-07-29: qty 50

## 2016-07-29 MED ORDER — AMLODIPINE BESYLATE 5 MG PO TABS
5.0000 mg | ORAL_TABLET | ORAL | Status: DC
  Administered 2016-07-30: 5 mg via ORAL
  Filled 2016-07-29: qty 1

## 2016-07-29 MED ORDER — EPOETIN ALFA 10000 UNIT/ML IJ SOLN
10000.0000 [IU] | INTRAMUSCULAR | Status: DC
  Administered 2016-07-29: 10000 [IU] via INTRAVENOUS
  Filled 2016-07-29: qty 1

## 2016-07-29 MED ORDER — ALBUTEROL SULFATE HFA 108 (90 BASE) MCG/ACT IN AERS: 2.0000 | INHALATION_SPRAY | RESPIRATORY_TRACT | Status: DC

## 2016-07-29 MED ORDER — TRAMADOL HCL 50 MG PO TABS: 50.0000 mg | ORAL_TABLET | Freq: Two times a day (BID) | ORAL | Status: DC | PRN

## 2016-07-29 MED ORDER — ACETAMINOPHEN 650 MG RE SUPP: 650.0000 mg | Freq: Four times a day (QID) | RECTAL | Status: DC | PRN

## 2016-07-29 MED ORDER — ACETAMINOPHEN 325 MG PO TABS
650.0000 mg | ORAL_TABLET | Freq: Four times a day (QID) | ORAL | Status: DC | PRN
  Administered 2016-07-29: 650 mg via ORAL

## 2016-07-29 MED ORDER — SODIUM POLYSTYRENE SULFONATE 15 GM/60ML PO SUSP
30.0000 g | Freq: Once | ORAL | Status: AC
  Administered 2016-07-29: 30 g via ORAL
  Filled 2016-07-29: qty 120

## 2016-07-29 MED ORDER — SODIUM CHLORIDE 0.9 % IV SOLN
1.0000 g | Freq: Once | INTRAVENOUS | Status: DC
  Filled 2016-07-29: qty 10

## 2016-07-29 NOTE — Progress Notes (Signed)
HD completed without issue. Report called to Pathmark StoresCrystal G. RN. Pt medicated with tylenol for slight headache.

## 2016-07-29 NOTE — Progress Notes (Signed)
Pre Dialysis 

## 2016-07-29 NOTE — Progress Notes (Signed)
[name]  ROUNDING NOTE   Subjective:   Eric Yu admitted on 07/29/2016 for End stage renal disease (HCC) [N18.6] Hyperkalemia [E87.5] Weakness [R53.1]   He has missed Saturday and Tuesday dialysis treatments. He is weak and tired from missing dialysis.   He states was sick on days scheduled for dialysis.   Objective:  Vital signs in last 24 hours:  Temp:  [97.4 F (36.3 C)] 97.4 F (36.3 C) (11/16 1101) Pulse Rate:  [46-74] 67 (11/16 1458) Resp:  [14-19] 14 (11/16 1458) BP: (143-186)/(55-76) 167/61 (11/16 1458) SpO2:  [95 %-100 %] 100 % (11/16 1458) Weight:  [66.2 kg (146 lb)-72.6 kg (160 lb)] 72.6 kg (160 lb) (11/16 1458)  Weight change:  Filed Weights   07/29/16 1107 07/29/16 1458  Weight: 66.2 kg (146 lb) 72.6 kg (160 lb)    Intake/Output: No intake/output data recorded.   Intake/Output this shift:  Total I/O In: 110 [IV Piggyback:110] Out: -   Physical Exam: General: NAD,   Head: Normocephalic, atraumatic. Moist oral mucosal membranes  Eyes: Anicteric, PERRL  Neck: Supple, trachea midline  Lungs:  Clear to auscultation  Heart: Regular rate and rhythm  Abdomen:  Soft, nontender,   Extremities: no peripheral edema.  Neurologic: Nonfocal, moving all four extremities  Skin: No lesions  Access: RIJ permcath, maturing AVG    Basic Metabolic Panel:  Recent Labs Lab 07/23/16 0856 07/29/16 1123  NA 139 138  K 4.5 5.7*  CL  --  104  CO2  --  22  GLUCOSE 81 91  BUN  --  77*  CREATININE  --  7.20*  CALCIUM  --  9.0  MG  --  2.4  PHOS  --  5.5*    Liver Function Tests:  Recent Labs Lab 07/29/16 1123  AST 18  ALT 10*  ALKPHOS 75  BILITOT 0.8  PROT 7.6  ALBUMIN 3.8   No results for input(s): LIPASE, AMYLASE in the last 168 hours. No results for input(s): AMMONIA in the last 168 hours.  CBC:  Recent Labs Lab 07/23/16 0856 07/29/16 1123  WBC  --  5.0  NEUTROABS  --  3.7  HGB 10.5* 9.8*  HCT 31.0* 30.5*  MCV  --   91.0  PLT  --  197    Cardiac Enzymes:  Recent Labs Lab 07/29/16 1123  TROPONINI 0.07*    BNP: Invalid input(s): POCBNP  CBG:  Recent Labs Lab 07/29/16 1207  GLUCAP 124*    Microbiology: Results for orders placed or performed during the hospital encounter of 07/23/16  Surgical pcr screen     Status: None   Collection Time: 07/23/16  8:42 AM  Result Value Ref Range Status   MRSA, PCR NEGATIVE NEGATIVE Final   Staphylococcus aureus NEGATIVE NEGATIVE Final    Comment:        The Xpert SA Assay (FDA approved for NASAL specimens in patients over 80 years of age), is one component of a comprehensive surveillance program.  Test performance has been validated by [facility] for patients greater than or equal to 176 year old. It is not intended to diagnose infection nor to guide or monitor treatment.     Coagulation Studies: No results for input(s): LABPROT, INR in the last 72 hours.  Urinalysis: No results for input(s): COLORURINE, LABSPEC, PHURINE, GLUCOSEU, HGBUR, BILIRUBINUR, KETONESUR, PROTEINUR, UROBILINOGEN, NITRITE, LEUKOCYTESUR in the last 72 hours.  Invalid input(s): APPERANCEUR    Imaging: Dg Chest 1 View  Result Date:  07/29/2016 CLINICAL DATA:  82 year old male with weakness and shortness of breath. EXAM: CHEST 1 VIEW COMPARISON:  05/08/2016 and prior radiographs dating back to 07/08/2005 FINDINGS: Cardiomegaly and mild pulmonary vascular congestion noted. A right IJ central venous catheter with tip overlying the lower SVC noted. There is no evidence of focal airspace disease, pulmonary edema, suspicious pulmonary nodule/mass, pleural effusion, or pneumothorax. No acute bony abnormalities are identified. IMPRESSION: Cardiomegaly with mild pulmonary vascular congestion. Electronically Signed   By: Harmon PierJeffrey  Hu M.D.   On: 07/29/2016 12:09     Medications:    . [START ON 07/30/2016] albuterol  2.5 mg Nebulization BH-q7a  . [START ON 07/30/2016]  allopurinol  100 mg Oral BH-q7a  . [START ON 07/30/2016] amLODipine  5 mg Oral BH-q7a  . [START ON 07/30/2016] aspirin EC  81 mg Oral BH-q7a  . epoetin (EPOGEN/PROCRIT) injection  10,000 Units Intravenous Q T,Th,Sa-HD  . [START ON 07/30/2016] heparin  5,000 Units Subcutaneous Q8H   acetaminophen **OR** acetaminophen, hydrALAZINE, HYDROcodone-acetaminophen, ondansetron **OR** ondansetron (ZOFRAN) IV, senna-docusate, traMADol  Assessment/ Plan:  [name] is a 82 y.o. black male with hypertension, gout, anemia, depression, COPD and tobacco abuse   [facility] RIJ permcath [address]   1. End-stage renal [facility]. Started on dialysis on 8/14.   AVG placed on 11/10 Continue TTS schedule. Missed two dialysis treatments. Plan for dialysis today. Orders prepared.   2. Anemia of chronic kidney disease: with iron deficiency.  - epo with hemodialysis treatment   3. Secondary hyperparathyroidism:  - not on any binders or vitamin D agents.  - monitor phos level  4. Hypertension: blood pressure at goal.  - amlodipine   LOS: 0 [place] Sigmon 11/16/20173:20 PM

## 2016-07-29 NOTE — H&P (Signed)
[facility] - [place] at [facility]   PATIENT NAME: Eric HalsJohn Cumpston    MR#:  [id number]  DATE OF BIRTH:  08-05-1934  DATE OF ADMISSION:  07/29/2016  PRIMARY CARE PHYSICIAN: Lauro RegulusANDERSON,MARSHALL W., MD   REQUESTING/REFERRING PHYSICIAN: Dr. Mayford KnifeWilliams  CHIEF COMPLAINT:  Not feeling well all week  HISTORY OF PRESENT ILLNESS:  Eric Yu  is a 82 y.o. male with a known history of Incisional disease on hemodialysis hypertension COPD depression comes to the emergency room after he was not feeling well all week. Patient missed 3 sessions of his dialysis last week because of not feeling well. He recently got out Gore-Tex graft placement in his left brachial artery by Dr. Evaristo BurySchnieder on 07/23/2016. Patient since then has not been feeling well. Came to the emergency room initially was found to be bradycardic. Currently his sinus rhythm heart rate in the 70s. He was found to have potassium of 5.7. Initial treatment for elevated potassium was given in the emergency room. Patient is being admitted for hyperkalemia, acute and missed dialysis sessions. Nephrology aware of patient being admitted.  PAST MEDICAL HISTORY:   Past Medical History:  Diagnosis Date  . Anemia   . Chronic kidney disease   . COPD (chronic obstructive pulmonary disease) (HCC)   . Depression   . Gout   . Hypertension   . Iron deficiency     PAST SURGICAL HISTOIRY:   Past Surgical History:  Procedure Laterality Date  . AV FISTULA PLACEMENT Left 07/23/2016   Procedure: INSERTION OF ARTERIOVENOUS (AV) GORE-TEX GRAFT ARM;  Surgeon: Renford DillsGregory G Schnier, MD;  Location: [facility] ORS;  Service: Vascular;  Laterality: Left;  . NO PAST SURGERIES    . PERIPHERAL VASCULAR CATHETERIZATION N/A 04/26/2016   Procedure: Dialysis/Perma Catheter Insertion;  Surgeon: Annice NeedyJason S Dew, MD;  Location: [facility] INVASIVE CV LAB;  Service: Cardiovascular;  Laterality: N/A;  . THYROIDECTOMY      SOCIAL HISTORY:   Social History  Substance  Use Topics  . Smoking status: Former Smoker    Quit date: 04/16/2016  . Smokeless tobacco: Never Used     Comment: pt is currently using nicotine patches  . Alcohol use No    FAMILY HISTORY:   Family History  Problem Relation Age of Onset  . Cancer Sister   . Hypertension Sister   . Urolithiasis Neg Hx   . Prostate cancer Neg Hx   . Kidney disease Neg Hx   . Kidney cancer Neg Hx     DRUG ALLERGIES:   Allergies  Allergen Reactions  . Cephalexin     Other reaction(s): Other (See Comments) Gross hematuria - occurred once. Not certain whether it was coincidence or causative.    REVIEW OF SYSTEMS:  Review of Systems  Constitutional: Negative for chills, fever and weight loss.  HENT: Negative for ear discharge, ear pain and nosebleeds.   Eyes: Negative for blurred vision, pain and discharge.  Respiratory: Negative for sputum production, shortness of breath, wheezing and stridor.   Cardiovascular: Negative for chest pain, palpitations, orthopnea and PND.  Gastrointestinal: Negative for abdominal pain, diarrhea, nausea and vomiting.  Genitourinary: Negative for frequency and urgency.  Musculoskeletal: Negative for back pain and joint pain.  Neurological: Positive for weakness. Negative for sensory change, speech change and focal weakness.  Psychiatric/Behavioral: Negative for depression and hallucinations. The patient is not nervous/anxious.      MEDICATIONS AT HOME:   Prior to Admission medications   Medication Sig Start Date End Date  Taking? Authorizing Provider  albuterol (PROVENTIL HFA;VENTOLIN HFA) 108 (90 Base) MCG/ACT inhaler Inhale 2 puffs into the lungs every morning.    Yes Historical Provider, MD  allopurinol (ZYLOPRIM) 100 MG tablet Take 100 mg by mouth every morning.    Yes Historical Provider, MD  amLODipine (NORVASC) 10 MG tablet Take 5 mg by mouth every morning.    Yes Historical Provider, MD  aspirin EC 81 MG tablet Take 81 mg by mouth every morning.    Yes  Historical Provider, MD  HYDROcodone-acetaminophen (NORCO) 5-325 MG tablet Take 1-2 tablets by mouth every 6 (six) hours as needed for moderate pain or severe pain. 07/23/16  Yes Renford DillsGregory G Schnier, MD      VITAL SIGNS:  Blood pressure (!) 143/55, pulse 74, temperature 97.4 F (36.3 C), temperature source Oral, resp. rate 16, height 5\' 8"  (1.727 m), weight 66.2 kg (146 lb), SpO2 97 %.  PHYSICAL EXAMINATION:  GENERAL:  82 y.o.-year-old patient lying in the bed with no acute distress.  EYES: Pupils equal, round, reactive to light and accommodation. No scleral icterus. Extraocular muscles intact.  HEENT: Head atraumatic, normocephalic. Oropharynx and nasopharynx clear.  NECK:  Supple, no jugular venous distention. No thyroid enlargement, no tenderness.  LUNGS: Normal breath sounds bilaterally, no wheezing, rales,rhonchi or crepitation. No use of accessory muscles of respiration.  CARDIOVASCULAR: S1, S2 normal. No murmurs, rubs, or gallops.  ABDOMEN: Soft, nontender, nondistended. Bowel sounds present. No organomegaly or mass.  EXTREMITIES: No pedal edema, cyanosis, or clubbing. Left UE HD graft + NEUROLOGIC: Cranial nerves II through XII are intact. Muscle strength 5/5 in all extremities. Sensation intact. Gait not checked.  PSYCHIATRIC: The patient is alert and oriented x 3.  SKIN: No obvious rash, lesion, or ulcer.   LABORATORY PANEL:   CBC  Recent Labs Lab 07/29/16 1123  WBC 5.0  HGB 9.8*  HCT 30.5*  PLT 197   ------------------------------------------------------------------------------------------------------------------  Chemistries   Recent Labs Lab 07/29/16 1123  NA 138  K 5.7*  CL 104  CO2 22  GLUCOSE 91  BUN 77*  CREATININE 7.20*  CALCIUM 9.0  MG 2.4  AST 18  ALT 10*  ALKPHOS 75  BILITOT 0.8   ------------------------------------------------------------------------------------------------------------------  Cardiac Enzymes  Recent Labs Lab  07/29/16 1123  TROPONINI 0.07*   ------------------------------------------------------------------------------------------------------------------  RADIOLOGY:  Dg Chest 1 View  Result Date: 07/29/2016 CLINICAL DATA:  82 year old male with weakness and shortness of breath. EXAM: CHEST 1 VIEW COMPARISON:  05/08/2016 and prior radiographs dating back to 07/08/2005 FINDINGS: Cardiomegaly and mild pulmonary vascular congestion noted. A right IJ central venous catheter with tip overlying the lower SVC noted. There is no evidence of focal airspace disease, pulmonary edema, suspicious pulmonary nodule/mass, pleural effusion, or pneumothorax. No acute bony abnormalities are identified. IMPRESSION: Cardiomegaly with mild pulmonary vascular congestion. Electronically Signed   By: Harmon PierJeffrey  Hu M.D.   On: 07/29/2016 12:09    EKG:  Sinus bradycardia. No peaked T waves.  IMPRESSION AND PLAN:    Eric HalsJohn Luzader  is a 82 y.o. male with a known history of Incisional disease on hemodialysis hypertension COPD depression comes to the emergency room after he was not feeling well all week. Patient missed 3 sessions of his dialysis last week because of not feeling well. He recently got out Gore-Tex graft placement in his left brachial artery by Dr. Evaristo BurySchnieder on 07/23/2016. Patient since then has not been feeling well.  1. Acute hyperkalemia without acute EKG changes in the setting of  missing 3 dialysis sessions last week -Patient received initial treatment with insulin and dextrose along with Kayexalate in the emergency room -Patient will get dialyzed today. Nephrology aware. -Chest x-ray shows mild pulmonary vascular congestion. However patient is asymptomatic other than generalized weakness. Patient does not seem to be in respiratory failure.  2. Generalized weakness -Physical therapy to see  3. Hypertension resume home meds  4. DVT prophylaxis subcutaneous heparin   All the records are reviewed and case  discussed with ED provider. Management plans discussed with the patient, family and they are in agreement.  CODE STATUS: Full  TOTAL TIME TAKING CARE OF THIS PATIENT: 50 minutes.    Hero Mccathern M.D on 07/29/2016 at 2:55 PM  Between 7am to 6pm - Pager - [phone]  After 6pm go to [email or web address] - password [email or web address] [facility]  [place] [facility]  Office  [phone]  CC: Primary care physician; Lauro Regulus., MD

## 2016-07-29 NOTE — Progress Notes (Signed)
Dialysis initiated without issue  

## 2016-07-29 NOTE — ED Triage Notes (Signed)
Pt to ED c/o weakness since last Friday.  States missed dialysis Saturday and Tuesday, last treatment last Thursday.  Wife states "talking out of his head".  Denies n/v/d.  Pt A&Ox4, speaking in complete and coherent sentences, chest rise even and unlabored.

## 2016-07-29 NOTE — Progress Notes (Signed)
Post hd assessment 

## 2016-07-29 NOTE — Progress Notes (Signed)
Pre dialysis assessment 

## 2016-07-29 NOTE — ED Provider Notes (Signed)
[facility] Emergency Department Provider Note        Time seen: ----------------------------------------- 11:20 AM on 07/29/2016 -----------------------------------------    I have reviewed the triage vital signs and the nursing notes.   HISTORY  Chief Complaint Weakness    HPI Eric Yu is a 82 y.o. male who presents to ER for weakness since last Friday. Patient states he missed dialysis Saturday and Tuesday. Last treatment was last Thursday. His wife states he's been confused, he denies nausea, vomiting or diarrhea. Patient states she's had poor appetite, has had black stools and weakness. He denies fevers, chills or other complaints.Patient had a left arm dialysis graft placed 6 days ago   Past Medical History:  Diagnosis Date  . Anemia   . Chronic kidney disease   . COPD (chronic obstructive pulmonary disease) (HCC)   . Depression   . Gout   . Hypertension   . Iron deficiency     Patient Active Problem List   Diagnosis Date Noted  . Generalized weakness   . Palliative care by specialist   . DNR (do not resuscitate)   . End stage renal disease (HCC)   . Symptomatic anemia 05/08/2016  . Severe major depression, single episode (HCC) 04/28/2016  . Acute renal failure (ARF) (HCC) 04/26/2016  . Injury of kidney 01/08/2015  . Blood loss, postoperative 01/06/2015  . Hypertensive left ventricular hypertrophy 12/26/2014  . Chronic kidney disease, stage IV (severe) (HCC) 12/12/2014  . Chronic kidney disease (CKD), stage III (moderate) 01/16/2014  . Gout 01/16/2014  . Benign hypertension 05/19/2012  . Current tobacco use 05/19/2012    Past Surgical History:  Procedure Laterality Date  . AV FISTULA PLACEMENT Left 07/23/2016   Procedure: INSERTION OF ARTERIOVENOUS (AV) GORE-TEX GRAFT ARM;  Surgeon: Renford DillsGregory G Schnier, MD;  Location: [facility] ORS;  Service: Vascular;  Laterality: Left;  . NO PAST SURGERIES    . PERIPHERAL VASCULAR CATHETERIZATION  N/A 04/26/2016   Procedure: Dialysis/Perma Catheter Insertion;  Surgeon: Annice NeedyJason S Dew, MD;  Location: [facility] INVASIVE CV LAB;  Service: Cardiovascular;  Laterality: N/A;  . THYROIDECTOMY      Allergies Cephalexin  Social History Social History  Substance Use Topics  . Smoking status: Former Smoker    Quit date: 04/16/2016  . Smokeless tobacco: Never Used     Comment: pt is currently using nicotine patches  . Alcohol use No    Review of Systems Constitutional: Negative for fever. Cardiovascular: Negative for chest pain. Respiratory: Negative for shortness of breath. Gastrointestinal: Negative for abdominal pain, vomiting and diarrhea. Genitourinary: Negative for dysuria. Musculoskeletal: Negative for back pain. Skin: Negative for rash. Neurological: Negative for headaches, positive for weakness  10-point ROS otherwise negative.  ____________________________________________   PHYSICAL EXAM:  VITAL SIGNS: ED Triage Vitals  Enc Vitals Group     BP --      Pulse Rate 07/29/16 1101 (!) 51     Resp 07/29/16 1101 18     Temp 07/29/16 1101 97.4 F (36.3 C)     Temp Source 07/29/16 1101 Oral     SpO2 07/29/16 1101 99 %     Weight 07/29/16 1107 146 lb (66.2 kg)     Height 07/29/16 1107 5\' 8"  (1.727 m)     Head Circumference --      Peak Flow --      Pain Score 07/29/16 1107 0     Pain Loc --      Pain Edu? --  Excl. in GC? --     Constitutional: Alert and oriented. Chronically ill appearing, no distress Eyes: Conjunctivae are normal. Normal extraocular movements. ENT   Head: Normocephalic and atraumatic.   Nose: No congestion/rhinnorhea.   Mouth/Throat: Mucous membranes are moist.   Neck: No stridor. Cardiovascular: Normal rate, regular rhythm. No murmurs, rubs, or gallops. Left upper arm with thrill and bruit at the graft site. Respiratory: Normal respiratory effort without tachypnea nor retractions. Breath sounds are clear and equal bilaterally. No  wheezes/rales/rhonchi. Gastrointestinal: Soft and nontender. Normal bowel sounds Musculoskeletal: Nontender with normal range of motion in all extremities. No lower extremity tenderness nor edema. Neurologic:  Normal speech and language. No gross focal neurologic deficits are appreciated.  Skin:  Skin is warm, dry with surgical site in the left upper arm without signs of infection Psychiatric: Mood and affect are normal. Speech and behavior are normal.  ____________________________________________  EKG: Interpreted by me. Slow atrial fibrillation with a rate of 57 bpm, normal QRS, long QT, left axis deviation, septal infarct  Repeat EKG interpreted by me after insulin, D50, Kayexalate, bicarbonate and IV calcium reveals sinus rhythm with a rate of 72 bpm, prolonged PR interval, normal QRS size, likely septal infarct, QT interval improved ____________________________________________  ED COURSE:  Pertinent labs & imaging results that were available during my care of the patient were reviewed by me and considered in my medical decision making (see chart for details). Clinical Course as of Jul 29 1152  Thu Jul 29, 2016  1129 Patient became bradycardic into the 40s, he'll be presumptively treated for hyperkalemia due to missing dialysis. I have ordered insulin, D50, sodium bicarbonate and IV calcium 2 g  [JW]    Clinical Course User Index [JW] Eric FilbertJonathan E Ozie Dimaria, MD  Patient presents to the ER with likely severe electrolyte disturbances and needing dialysis. Patient will need IV calcium, we will await further lab work and discuss with nephrology  Procedures ____________________________________________   LABS (pertinent positives/negatives)  Labs Reviewed  CBC WITH DIFFERENTIAL/PLATELET - Abnormal; Notable for the following:       Result Value   RBC 3.35 (*)    Hemoglobin 9.8 (*)    HCT 30.5 (*)    RDW 16.4 (*)    Lymphs Abs 0.8 (*)    All other components within normal limits   COMPREHENSIVE METABOLIC PANEL - Abnormal; Notable for the following:    Potassium 5.7 (*)    BUN 77 (*)    Creatinine, Ser 7.20 (*)    ALT 10 (*)    GFR calc non Af Amer 6 (*)    GFR calc Af Amer 7 (*)    All other components within normal limits  TROPONIN I - Abnormal; Notable for the following:    Troponin I 0.07 (*)    All other components within normal limits  PHOSPHORUS - Abnormal; Notable for the following:    Phosphorus 5.5 (*)    All other components within normal limits  BLOOD GAS, VENOUS - Abnormal; Notable for the following:    pCO2, Ven 34 (*)    Bicarbonate 16.7 (*)    Acid-base deficit 8.8 (*)    All other components within normal limits  MAGNESIUM  CRITICAL CARE Performed by: Eric FilbertWilliams, Alois Mincer E   Total critical care time: 30 minutes  Critical care time was exclusive of separately billable procedures and treating other patients.  Critical care was necessary to treat or prevent imminent or life-threatening deterioration.  Critical care was  time spent personally by me on the following activities: development of treatment plan with patient and/or surrogate as well as nursing, discussions with consultants, evaluation of patient's response to treatment, examination of patient, obtaining history from patient or surrogate, ordering and performing treatments and interventions, ordering and review of laboratory studies, ordering and review of radiographic studies, pulse oximetry and re-evaluation of patient's condition.   RADIOLOGY Images were viewed by me  Chest x-ray Does not reveal acute process ____________________________________________  FINAL ASSESSMENT AND PLAN  End-stage renal disease, hyperkalemia  Plan: Patient with labs and imaging as dictated above. Patient had presented with weakness, found to be bradycardic having missed dialysis. He does have acute on chronic renal insufficiency with hyperkalemia. After insulin, D50, Kayexalate, bicarbonate,  calcium treatments he does have a more organized rhythm and his heart rate is much improved. Currently heart rate is in the 70s. I will discuss with nephrology and hospitalist for admission.   Eric Filbert, MD   Note: This dictation was prepared with Dragon dictation. Any transcriptional errors that result from this process are unintentional    Eric Filbert, MD 07/29/16 1210

## 2016-07-30 LAB — BASIC METABOLIC PANEL
ANION GAP: 10 (ref 5–15)
BUN: 36 mg/dL — ABNORMAL HIGH (ref 6–20)
CALCIUM: 8.5 mg/dL — AB (ref 8.9–10.3)
CHLORIDE: 102 mmol/L (ref 101–111)
CO2: 28 mmol/L (ref 22–32)
Creatinine, Ser: 4.02 mg/dL — ABNORMAL HIGH (ref 0.61–1.24)
GFR calc non Af Amer: 13 mL/min — ABNORMAL LOW (ref >60)
GFR, EST AFRICAN AMERICAN: 15 mL/min — AB (ref >60)
GLUCOSE: 79 mg/dL (ref 65–99)
Potassium: 4 mmol/L (ref 3.5–5.1)
Sodium: 140 mmol/L (ref 135–145)

## 2016-07-30 LAB — HEPATITIS B SURFACE ANTIBODY,QUALITATIVE: Hep B S Ab: REACTIVE

## 2016-07-30 LAB — BLOOD GAS, VENOUS
Acid-base deficit: 8.8 mmol/L — ABNORMAL HIGH (ref 0.0–2.0)
Bicarbonate: 16.7 mmol/L — ABNORMAL LOW (ref 20.0–28.0)
PCO2 VEN: 34 mmHg — AB (ref 44.0–60.0)
Patient temperature: 37
pH, Ven: 7.3 (ref 7.250–7.430)

## 2016-07-30 LAB — HEPATITIS B CORE ANTIBODY, TOTAL: HEP B C TOTAL AB: POSITIVE — AB

## 2016-07-30 LAB — HEPATITIS B SURFACE ANTIGEN: HEP B S AG: NEGATIVE

## 2016-07-30 MED ORDER — ISOSORBIDE MONONITRATE ER 30 MG PO TB24: 30.0000 mg | ORAL_TABLET | Freq: Every day | ORAL | 2 refills | Status: DC

## 2016-07-30 MED ORDER — TORSEMIDE 20 MG PO TABS: 20.0000 mg | ORAL_TABLET | Freq: Every day | ORAL | 0 refills | Status: DC

## 2016-07-30 MED ORDER — HYDRALAZINE HCL 25 MG PO TABS: 25.0000 mg | ORAL_TABLET | Freq: Three times a day (TID) | ORAL | 2 refills | Status: DC

## 2016-07-30 MED ORDER — SENNOSIDES-DOCUSATE SODIUM 8.6-50 MG PO TABS: 1.0000 | ORAL_TABLET | Freq: Every evening | ORAL | 0 refills | Status: AC | PRN

## 2016-07-30 MED ORDER — TORSEMIDE 20 MG PO TABS
20.0000 mg | ORAL_TABLET | Freq: Every day | ORAL | Status: DC
  Administered 2016-07-30: 20 mg via ORAL
  Filled 2016-07-30: qty 1

## 2016-07-30 NOTE — Care Management (Addendum)
Patient acknowledges that he missed his dialysis on Saturday because he was sick after a procedure on 11.10. Patient denies that he wants to stop dialysis and denies that he has transportation issues.  He may benefit from home health nurse follow up and social work to determine if there are other medical or social issues.  Found that he is open to [facility] and PT.  [facility]  Informed there are no medication needs by patient.  Requested home health orders from physician

## 2016-07-30 NOTE — Progress Notes (Signed)
Patient discharged home with homehealth as ordered,instructions explained and well understood,hemodynamics within normal limits,escorted by family and staff member via wheel chair.

## 2016-07-30 NOTE — Progress Notes (Signed)
[name]  ROUNDING NOTE   Subjective:   Hemodialysis treatment yesterday. Tolerated treatment well. UF of 2 litres.   Wife at bedside.   Patient seated in chair.   Objective:  Vital signs in last 24 hours:  Temp:  [97.4 F (36.3 C)-98.3 F (36.8 C)] 98.3 F (36.8 C) (11/17 0755) Pulse Rate:  [46-88] 72 (11/17 0755) Resp:  [14-19] 18 (11/17 0755) BP: (143-186)/(55-152) 175/65 (11/17 0755) SpO2:  [94 %-100 %] 100 % (11/17 0755) Weight:  [64.5 kg (142 lb 3.2 oz)-72.6 kg (160 lb)] 64.5 kg (142 lb 3.2 oz) (11/16 1901)  Weight change:  Filed Weights   07/29/16 1458 07/29/16 1543 07/29/16 1901  Weight: 72.6 kg (160 lb) 67 kg (147 lb 11.2 oz) 64.5 kg (142 lb 3.2 oz)    Intake/Output: I/O last 3 completed shifts: In: 110 [IV Piggyback:110] Out: 2000 [Other:2000]   Intake/Output this shift:  No intake/output data recorded.  Physical Exam: General: NAD,   Head: Normocephalic, atraumatic. Moist oral mucosal membranes  Eyes: Anicteric, PERRL  Neck: Supple, trachea midline  Lungs:  Clear to auscultation  Heart: Regular rate and rhythm  Abdomen:  Soft, nontender,   Extremities: no peripheral edema.  Neurologic: Nonfocal, moving all four extremities  Skin: No lesions  Access: RIJ permcath, maturing AVG    Basic Metabolic Panel:  Recent Labs Lab 07/29/16 1123 07/30/16 0521  NA 138 140  K 5.7* 4.0  CL 104 102  CO2 22 28  GLUCOSE 91 79  BUN 77* 36*  CREATININE 7.20* 4.02*  CALCIUM 9.0 8.5*  MG 2.4  --   PHOS 5.5*  --     Liver Function Tests:  Recent Labs Lab 07/29/16 1123  AST 18  ALT 10*  ALKPHOS 75  BILITOT 0.8  PROT 7.6  ALBUMIN 3.8   No results for input(s): LIPASE, AMYLASE in the last 168 hours. No results for input(s): AMMONIA in the last 168 hours.  CBC:  Recent Labs Lab 07/29/16 1123  WBC 5.0  NEUTROABS 3.7  HGB 9.8*  HCT 30.5*  MCV 91.0  PLT 197    Cardiac Enzymes:  Recent Labs Lab 07/29/16 1123  TROPONINI  0.07*    BNP: Invalid input(s): POCBNP  CBG:  Recent Labs Lab 07/29/16 1207  GLUCAP 124*    Microbiology: Results for orders placed or performed during the hospital encounter of 07/23/16  Surgical pcr screen     Status: None   Collection Time: 07/23/16  8:42 AM  Result Value Ref Range Status   MRSA, PCR NEGATIVE NEGATIVE Final   Staphylococcus aureus NEGATIVE NEGATIVE Final    Comment:        The Xpert SA Assay (FDA approved for NASAL specimens in patients over 80 years of age), is one component of a comprehensive surveillance program.  Test performance has been validated by [facility] for patients greater than or equal to 80 year old. It is not intended to diagnose infection nor to guide or monitor treatment.     Coagulation Studies: No results for input(s): LABPROT, INR in the last 72 hours.  Urinalysis: No results for input(s): COLORURINE, LABSPEC, PHURINE, GLUCOSEU, HGBUR, BILIRUBINUR, KETONESUR, PROTEINUR, UROBILINOGEN, NITRITE, LEUKOCYTESUR in the last 72 hours.  Invalid input(s): APPERANCEUR    Imaging: Dg Chest 1 View  Result Date: 07/29/2016 CLINICAL DATA:  82 year old male with weakness and shortness of breath. EXAM: CHEST 1 VIEW COMPARISON:  05/08/2016 and prior radiographs dating back to 07/08/2005 FINDINGS: Cardiomegaly and mild  pulmonary vascular congestion noted. A right IJ central venous catheter with tip overlying the lower SVC noted. There is no evidence of focal airspace disease, pulmonary edema, suspicious pulmonary nodule/mass, pleural effusion, or pneumothorax. No acute bony abnormalities are identified. IMPRESSION: Cardiomegaly with mild pulmonary vascular congestion. Electronically Signed   By: Harmon PierJeffrey  Hu M.D.   On: 07/29/2016 12:09     Medications:    . albuterol  2.5 mg Nebulization BH-q7a  . allopurinol  100 mg Oral BH-q7a  . amLODipine  5 mg Oral BH-q7a  . aspirin EC  81 mg Oral BH-q7a  . epoetin (EPOGEN/PROCRIT) injection   10,000 Units Intravenous Q T,Th,Sa-HD  . heparin  5,000 Units Subcutaneous Q8H   acetaminophen **OR** acetaminophen, hydrALAZINE, HYDROcodone-acetaminophen, ondansetron **OR** ondansetron (ZOFRAN) IV, senna-docusate, traMADol  Assessment/ Plan:  [name] is a 82 y.o. black male with hypertension, gout, anemia, depression, COPD and tobacco abuse   [facility]   1. [facility]. Started on dialysis on 8/14.   AVG placed on 11/10. May start using in 2 more weeks.  Continue TTS schedule.  2. Anemia of chronic kidney disease: with iron deficiency.  - epo with hemodialysis treatment   3. Secondary hyperparathyroidism:  - not on any binders or vitamin D agents.  - monitor phos level  4. Hypertension: blood pressure elevated today.  - amlodipine - restart torsemide    LOS: 1 Thaniel Coluccio 11/17/201710:06 AM

## 2016-07-30 NOTE — Evaluation (Signed)
Physical Therapy Evaluation Patient Details Name: Eric HalsJohn Yu MRN: [id number] DOB: 1933/11/26 Today's Date: 07/30/2016   History of Present Illness  Pt is a 82 y.o. male with a known history of Incisional disease on hemodialysis hypertension COPD depression comes to the emergency room after he was not feeling well all week. Patient missed 3 sessions of his dialysis last week because of not feeling well. He recently got out Gore-Tex graft placement in his left brachial artery by Dr. Evaristo BurySchnieder on 07/23/2016. Patient since then has not been feeling well. Came to the emergency room initially was found to be bradycardic. Currently his sinus rhythm heart rate in the 70s. He was found to have potassium of 5.7. Initial treatment for elevated potassium was given in the emergency room. Patient is being admitted for hyperkalemia, acute and missed dialysis sessions. Nephrology aware of patient being admitted.  Clinical Impression  Pt presents with deficits in strength, transfers, gait, balance, and activity tolerance.  Pt was ind with bed mobility and SBA with transfers.  Pt ambulated 100' with RW and SBA with HR increasing from 84 bpm to 97 bpm and minimal SOB.  Min instability during balance training but no LOB.  Pt will benefit from PT services to address above deficits for decreased caregiver assistance upon discharge.      Follow Up Recommendations Home health PT    Equipment Recommendations  None recommended by PT    Recommendations for Other Services       Precautions / Restrictions Precautions Precautions: Fall Restrictions Weight Bearing Restrictions: No      Mobility  Bed Mobility Overal bed mobility: Independent                Transfers Overall transfer level: Needs assistance Equipment used: Rolling walker (2 wheeled) Transfers: Sit to/from Stand Sit to Stand: Supervision            Ambulation/Gait Ambulation/Gait assistance: Supervision Ambulation Distance  (Feet): 100 Feet Assistive device: Rolling walker (2 wheeled) Gait Pattern/deviations: Step-through pattern;Decreased step length - right;Decreased step length - left;Trunk flexed   Gait velocity interpretation: Below normal speed for age/gender General Gait Details: Slow cadence with poor heel strike with verbal cues to amb closer to RW for safety, fair activity tolerance;  SpO2 99% and HR 97 bpm after amb with minimal SOB  Stairs            Wheelchair Mobility    Modified Rankin (Stroke Patients Only)       Balance Overall balance assessment: Needs assistance Sitting-balance support: No upper extremity supported Sitting balance-Leahy Scale: Good     Standing balance support: Bilateral upper extremity supported Standing balance-Leahy Scale: Good                               Pertinent Vitals/Pain Pain Assessment: No/denies pain    Home Living Family/patient expects to be discharged to:: Private residence Living Arrangements: Alone Available Help at Discharge: Personal care attendant (Personal care attendant 7x/wk ) Type of Home: House Home Access: Stairs to enter Entrance Stairs-Rails: Can reach both;Left;Right Entrance Stairs-Number of Steps: 2 Home Layout: One level Home Equipment: Walker - 2 wheels      Prior Function Level of Independence: Needs assistance   Gait / Transfers Assistance Needed: Ind amb with RW in home  ADL's / Homemaking Assistance Needed: Receives assistance from personal care attendant with ADLs and errands/driving pt to appointments  Hand Dominance   Dominant Hand: Right    Extremity/Trunk Assessment               Lower Extremity Assessment: Generalized weakness         Communication   Communication: No difficulties  Cognition Arousal/Alertness: Awake/alert Behavior During Therapy: WFL for tasks assessed/performed Overall Cognitive Status: Within Functional Limits for tasks assessed                       General Comments      Exercises Total Joint Exercises Long Arc Quad: Strengthening;Both;10 reps Knee Flexion: Strengthening;Both;10 reps Marching in Standing: AROM;Both;10 reps Other Exercises Other Exercises: Static standing balance training with feet together and feet apart and combinations of eyes open/closed and head still/head turns   Assessment/Plan    PT Assessment Patient needs continued PT services  PT Problem List Decreased strength;Decreased activity tolerance;Decreased balance;Decreased knowledge of use of DME          PT Treatment Interventions Gait training;Stair training;Functional mobility training;Neuromuscular re-education;Balance training;Therapeutic exercise;Patient/family education;Therapeutic activities;DME instruction    PT Goals (Current goals can be found in the Care Plan section)  Acute Rehab PT Goals Patient Stated Goal: Get stronger to walk better PT Goal Formulation: With patient Time For Goal Achievement: 08/12/16 Potential to Achieve Goals: Good    Frequency Min 2X/week   Barriers to discharge        Co-evaluation               End of Session Equipment Utilized During Treatment: Gait belt Activity Tolerance: Patient tolerated treatment well Patient left: in bed;with bed alarm set;with call bell/phone within reach Nurse Communication: Mobility status         Time: [id number] PT Time Calculation (min) (ACUTE ONLY): 25 min   Charges:   PT Evaluation $PT Eval Low Complexity: 1 Procedure PT Treatments $Therapeutic Exercise: 8-22 mins   PT G Codes:        Elly Modena. Scott Merrick Feutz PT, DPT 07/30/16, 5:10 PM

## 2016-07-31 NOTE — Discharge Summary (Signed)
[facility] [place] [facility]   PATIENT NAME: Eric Yu Guadarrama    MR#:  [id number]  DATE OF BIRTH:  1934-04-22  DATE OF ADMISSION:  07/29/2016   ADMITTING PHYSICIAN: Enedina FinnerSona Patel, MD  DATE OF DISCHARGE: 07/30/2016  4:39 PM  PRIMARY CARE PHYSICIAN: Lauro RegulusANDERSON,MARSHALL W., MD   ADMISSION DIAGNOSIS:   End stage renal disease (HCC) [N18.6] Hyperkalemia [E87.5] Weakness [R53.1]  DISCHARGE DIAGNOSIS:   Active Problems:   Hyperkalemia   SECONDARY DIAGNOSIS:   Past Medical History:  Diagnosis Date  . Anemia   . Chronic kidney disease   . COPD (chronic obstructive pulmonary disease) (HCC)   . Depression   . Gout   . Hypertension   . Iron deficiency     HOSPITAL COURSE:   Eric Yu Eric Yu  is a 82 y.o. male with a known history of Incisional disease on hemodialysis, hypertension, COPD, depression comes to the emergency room after he was not feeling well all week.  -Patient missed 3 sessions of his dialysis last week because of not feeling well.  -Patient since then has not been feeling well.  1. Acute hyperkalemia without acute EKG changes in the setting of missing 3 dialysis sessions last week -Patient received treatment for hyperkalemia and also dialysis- it improved. -Chest x-ray shows mild pulmonary vascular congestion. However patient is asymptomatic other than generalized weakness. Patient does not seem to be in respiratory failure. - appreciate nephrology consult  2. Generalized weakness -Physical therapy recommended home health  3. ESRD on HD- noncompliant, misses dialysis and gets admitted On Tue-Thur-Sat schedule dialysed in the hospital.  4. Hypertension resume home meds- norvasc. - imdur, torsemide and hydralazine oral are new meds added at discharge for hypertension.  Being discharged today   DISCHARGE CONDITIONS:   Guarded  CONSULTS OBTAINED:   Treatment Team:  Lamont DowdySarath Kolluru, MD  DRUG ALLERGIES:   Allergies  Allergen  Reactions  . Cephalexin     Other reaction(s): Other (See Comments) Gross hematuria - occurred once. Not certain whether it was coincidence or causative.   DISCHARGE MEDICATIONS:     Medication List    TAKE these medications   albuterol 108 (90 Base) MCG/ACT inhaler Commonly known as:  PROVENTIL HFA;VENTOLIN HFA Inhale 2 puffs into the lungs every morning.   allopurinol 100 MG tablet Commonly known as:  ZYLOPRIM Take 100 mg by mouth every morning.   amLODipine 10 MG tablet Commonly known as:  NORVASC Take 5 mg by mouth every morning.   aspirin EC 81 MG tablet Take 81 mg by mouth every morning.   hydrALAZINE 25 MG tablet Commonly known as:  APRESOLINE Take 1 tablet (25 mg total) by mouth 3 (three) times daily.   HYDROcodone-acetaminophen 5-325 MG tablet Commonly known as:  NORCO Take 1-2 tablets by mouth every 6 (six) hours as needed for moderate pain or severe pain.   isosorbide mononitrate 30 MG 24 hr tablet Commonly known as:  IMDUR Take 1 tablet (30 mg total) by mouth at bedtime.   senna-docusate 8.6-50 MG tablet Commonly known as:  Senokot-S Take 1 tablet by mouth at bedtime as needed for mild constipation.   torsemide 20 MG tablet Commonly known as:  DEMADEX Take 1 tablet (20 mg total) by mouth daily.        DISCHARGE INSTRUCTIONS:   1. For dialysis as outpatient on Saturday per schedule  DIET:   Cardiac diet and renal diet  ACTIVITY:   Activity as tolerated  OXYGEN:  Home Oxygen: No.  Oxygen Delivery: room air  DISCHARGE LOCATION:   home   If you experience worsening of your admission symptoms, develop shortness of breath, life threatening emergency, suicidal or homicidal thoughts you must seek medical attention immediately by calling 911 or calling your MD immediately  if symptoms less severe.  You Must read complete instructions/literature along with all the possible adverse reactions/side effects for all the Medicines you take and  that have been prescribed to you. Take any new Medicines after you have completely understood and accpet all the possible adverse reactions/side effects.   Please note  You were cared for by a hospitalist during your hospital stay. If you have any questions about your discharge medications or the care you received while you were in the hospital after you are discharged, you can call the unit and asked to speak with the hospitalist on call if the hospitalist that took care of you is not available. Once you are discharged, your primary care physician will handle any further medical issues. Please note that NO REFILLS for any discharge medications will be authorized once you are discharged, as it is imperative that you return to your primary care physician (or establish a relationship with a primary care physician if you do not have one) for your aftercare needs so that they can reassess your need for medications and monitor your lab values.    On the day of Discharge:  VITAL SIGNS:   Blood pressure (!) 186/74, pulse 84, temperature 97.8 F (36.6 C), resp. rate 16, height 5\' 9"  (1.753 m), weight 64.5 kg (142 lb 3.2 oz), SpO2 98 %.  PHYSICAL EXAMINATION:    GENERAL:  82 y.o.-year-old patient lying in the bed with no acute distress.  EYES: Pupils equal, round, reactive to light and accommodation. No scleral icterus. Extraocular muscles intact.  HEENT: Head atraumatic, normocephalic. Oropharynx and nasopharynx clear.  NECK:  Supple, no jugular venous distention. No thyroid enlargement, no tenderness.  LUNGS: Normal breath sounds bilaterally, no wheezing, rales,rhonchi or crepitation. No use of accessory muscles of respiration.  CARDIOVASCULAR: S1, S2 normal. No murmurs, rubs, or gallops.  ABDOMEN: Soft, nontender, nondistended. Bowel sounds present. No organomegaly or mass.  EXTREMITIES: No pedal edema, cyanosis, or clubbing. Left UE HD graft + NEUROLOGIC: Cranial nerves II through XII are intact.  Muscle strength 5/5 in all extremities. Sensation intact. Gait not checked.  PSYCHIATRIC: The patient is alert and oriented x 3.  SKIN: No obvious rash, lesion, or ulcer.   DATA REVIEW:   CBC  Recent Labs Lab 07/29/16 1123  WBC 5.0  HGB 9.8*  HCT 30.5*  PLT 197    Chemistries   Recent Labs Lab 07/29/16 1123 07/30/16 0521  NA 138 140  K 5.7* 4.0  CL 104 102  CO2 22 28  GLUCOSE 91 79  BUN 77* 36*  CREATININE 7.20* 4.02*  CALCIUM 9.0 8.5*  MG 2.4  --   AST 18  --   ALT 10*  --   ALKPHOS 75  --   BILITOT 0.8  --      Microbiology Results  Results for orders placed or performed during the hospital encounter of 07/23/16  Surgical pcr screen     Status: None   Collection Time: 07/23/16  8:42 AM  Result Value Ref Range Status   MRSA, PCR NEGATIVE NEGATIVE Final   Staphylococcus aureus NEGATIVE NEGATIVE Final    Comment:        The Xpert SA  Assay (FDA approved for NASAL specimens in patients over 80 years of age), is one component of a comprehensive surveillance program.  Test performance has been validated by [facility] for patients greater than or equal to 80 year old. It is not intended to diagnose infection nor to guide or monitor treatment.     RADIOLOGY:  No results found.   Management plans discussed with the patient, family and they are in agreement.  CODE STATUS:  Code Status History    Date Active Date Inactive Code Status Order ID Comments User Context   07/29/2016  2:56 PM 07/30/2016  7:44 PM Full Code [id number]  Enedina FinnerSona Patel, MD Inpatient   05/11/2016  8:58 AM 05/12/2016  1:33 AM DNR [id number]  Canary BrimMary W Larach, NP Inpatient   05/08/2016  1:54 PM 05/11/2016  8:58 AM Full Code [id number]  Milagros LollSrikar Sudini, MD ED   04/26/2016  7:06 PM 05/03/2016  6:47 PM Full Code [id number]  Adrian SaranSital Mody, MD Inpatient      TOTAL TIME TAKING CARE OF THIS PATIENT: 37 minutes.    Enid BaasKALISETTI,Evea Sheek M.D on 07/31/2016 at 2:10 PM  Between 7am to 6pm - Pager -  [phone]  After 6pm go to [email or web address] - Social research officer, [email or web address] [email or web address]  [facility] [place]  Office  [phone]  CC: Primary care physician; Lauro RegulusANDERSON,MARSHALL W., MD   Note: This dictation was prepared with Dragon dictation along with smaller phrase technology. Any transcriptional errors that result from this process are unintentional.

## 2016-08-16 ENCOUNTER — Ambulatory Visit (INDEPENDENT_AMBULATORY_CARE_PROVIDER_SITE_OTHER): Payer: Medicare HMO | Admitting: Vascular Surgery

## 2016-08-16 ENCOUNTER — Encounter (INDEPENDENT_AMBULATORY_CARE_PROVIDER_SITE_OTHER): Payer: Self-pay | Admitting: Vascular Surgery

## 2016-08-16 VITALS — BP 162/69 | HR 72 | Resp 16 | Ht 68.0 in | Wt 150.0 lb

## 2016-08-16 DIAGNOSIS — N186 End stage renal disease: Secondary | ICD-10-CM

## 2016-08-16 NOTE — Progress Notes (Signed)
Patient ID: Eric Yu, male   DOB: 03-09-1934, 82 y.o.   MRN: [id number]  Chief Complaint  Patient presents with  . Follow-up    HPI Eric HalsJohn Arai is a 82 y.o. male.  He is s/p creation of left arm brachial axillary AV graft on 07/23/2016.  He has no complaints   Past Medical History:  Diagnosis Date  . Anemia   . Chronic kidney disease   . COPD (chronic obstructive pulmonary disease) (HCC)   . Depression   . Gout   . Hypertension   . Iron deficiency     Past Surgical History:  Procedure Laterality Date  . AV FISTULA PLACEMENT Left 07/23/2016   Procedure: INSERTION OF ARTERIOVENOUS (AV) GORE-TEX GRAFT ARM;  Surgeon: Renford DillsGregory G Jori Thrall, MD;  Location: [facility] ORS;  Service: Vascular;  Laterality: Left;  . NO PAST SURGERIES    . PERIPHERAL VASCULAR CATHETERIZATION N/A 04/26/2016   Procedure: Dialysis/Perma Catheter Insertion;  Surgeon: Annice NeedyJason S Dew, MD;  Location: [facility] INVASIVE CV LAB;  Service: Cardiovascular;  Laterality: N/A;  . THYROIDECTOMY        Allergies  Allergen Reactions  . Cephalexin     Other reaction(s): Other (See Comments) Gross hematuria - occurred once. Not certain whether it was coincidence or causative.    Current Outpatient Prescriptions  Medication Sig Dispense Refill  . albuterol (PROVENTIL HFA;VENTOLIN HFA) 108 (90 Base) MCG/ACT inhaler Inhale 2 puffs into the lungs every morning.     Marland Kitchen. allopurinol (ZYLOPRIM) 100 MG tablet Take 100 mg by mouth every morning.     Marland Kitchen. amLODipine (NORVASC) 10 MG tablet Take 5 mg by mouth every morning.     Marland Kitchen. aspirin EC 81 MG tablet Take 81 mg by mouth every morning.     . cloNIDine (CATAPRES) 0.1 MG tablet Take by mouth 2 (two) times daily.    . hydrALAZINE (APRESOLINE) 25 MG tablet Take 1 tablet (25 mg total) by mouth 3 (three) times daily. 90 tablet 2  . HYDROcodone-acetaminophen (NORCO) 5-325 MG tablet Take 1-2 tablets by mouth every 6 (six) hours as needed for moderate pain or severe pain. 50 tablet 0  .  isosorbide mononitrate (IMDUR) 30 MG 24 hr tablet Take 1 tablet (30 mg total) by mouth at bedtime. 30 tablet 2  . senna-docusate (SENOKOT-S) 8.6-50 MG tablet Take 1 tablet by mouth at bedtime as needed for mild constipation. (Patient not taking: Reported on 08/16/2016) 30 tablet 0  . torsemide (DEMADEX) 20 MG tablet Take 1 tablet (20 mg total) by mouth daily. (Patient not taking: Reported on 08/16/2016) 30 tablet 0   No current facility-administered medications for this visit.         Physical Exam BP (!) 162/69   Pulse 72   Resp 16   Ht 5\' 8"  (1.727 m)   Wt 150 lb (68 kg)   BMI 22.81 kg/m  Gen:  WD/WN, NAD Skin: incision C/D/I; AV graft good thrill and good bruit nontender 1+ radial pulse on left      Assessment/Plan: Recommend:  The patient is doing well and currently has adequate dialysis access. The patient's dialysis center can start using his graft.  The patient should have a duplex ultrasound of the dialysis access in 6 months.   The patient will follow-up with me in the office after each ultrasound     No problem-specific Assessment & Plan notes found for this encounter.      Levora DredgeGregory Madell Heino 08/16/2016, 10:06 AM  This note was created with Dragon medical transcription system.  Any errors from dictation are unintentional.

## 2016-08-20 ENCOUNTER — Encounter (INDEPENDENT_AMBULATORY_CARE_PROVIDER_SITE_OTHER): Payer: Self-pay

## 2016-08-23 ENCOUNTER — Other Ambulatory Visit (INDEPENDENT_AMBULATORY_CARE_PROVIDER_SITE_OTHER): Payer: Self-pay | Admitting: Vascular Surgery

## 2016-08-24 ENCOUNTER — Encounter: Admission: RE | Payer: Self-pay | Source: Ambulatory Visit

## 2016-08-24 ENCOUNTER — Ambulatory Visit: Admission: RE | Admit: 2016-08-24 | Payer: Medicare HMO | Source: Ambulatory Visit | Admitting: Vascular Surgery

## 2016-08-24 SURGERY — DIALYSIS/PERMA CATHETER REMOVAL
Anesthesia: Moderate Sedation

## 2016-08-30 ENCOUNTER — Other Ambulatory Visit (INDEPENDENT_AMBULATORY_CARE_PROVIDER_SITE_OTHER): Payer: Self-pay | Admitting: Vascular Surgery

## 2016-08-30 ENCOUNTER — Encounter (INDEPENDENT_AMBULATORY_CARE_PROVIDER_SITE_OTHER): Payer: Self-pay

## 2016-09-01 ENCOUNTER — Encounter
Admission: RE | Disposition: A | Discharge status: Home / Self Care | Payer: Self-pay | Source: Ambulatory Visit | Attending: Vascular Surgery

## 2016-09-01 ENCOUNTER — Ambulatory Visit
Admission: RE | Admit: 2016-09-01 | Discharge: 2016-09-01 | Disposition: A | Discharge status: Home / Self Care | Payer: Medicare HMO | Source: Ambulatory Visit | Attending: Vascular Surgery | Admitting: Vascular Surgery

## 2016-09-01 DIAGNOSIS — M109 Gout, unspecified: Secondary | ICD-10-CM | POA: Insufficient documentation

## 2016-09-01 DIAGNOSIS — D509 Iron deficiency anemia, unspecified: Secondary | ICD-10-CM | POA: Insufficient documentation

## 2016-09-01 DIAGNOSIS — Z452 Encounter for adjustment and management of vascular access device: Secondary | ICD-10-CM | POA: Insufficient documentation

## 2016-09-01 DIAGNOSIS — I120 Hypertensive chronic kidney disease with stage 5 chronic kidney disease or end stage renal disease: Secondary | ICD-10-CM | POA: Insufficient documentation

## 2016-09-01 DIAGNOSIS — Z992 Dependence on renal dialysis: Secondary | ICD-10-CM | POA: Insufficient documentation

## 2016-09-01 DIAGNOSIS — N186 End stage renal disease: Secondary | ICD-10-CM | POA: Insufficient documentation

## 2016-09-01 DIAGNOSIS — J449 Chronic obstructive pulmonary disease, unspecified: Secondary | ICD-10-CM | POA: Diagnosis not present

## 2016-09-01 DIAGNOSIS — Z881 Allergy status to other antibiotic agents status: Secondary | ICD-10-CM | POA: Insufficient documentation

## 2016-09-01 DIAGNOSIS — Z7982 Long term (current) use of aspirin: Secondary | ICD-10-CM | POA: Insufficient documentation

## 2016-09-01 SURGERY — DIALYSIS/PERMA CATHETER REMOVAL
Anesthesia: Moderate Sedation

## 2016-09-01 MED ORDER — LIDOCAINE HCL (PF) 1 % IJ SOLN
INTRAMUSCULAR | Status: AC
  Filled 2016-09-01: qty 30

## 2016-09-01 NOTE — Op Note (Signed)
Operative Note     Preoperative diagnosis:   1. ESRD with functional permanent access  Postoperative diagnosis:  1. ESRD with functional permanent access  Procedure:  Removal of right jugular Permcath  Surgeon:  Festus BarrenJason Lety Cullens, MD  Anesthesia:  Local  EBL:  Minimal  Indication for the Procedure:  The patient has a functional permanent dialysis access and no longer needs their permcath.  This can be removed.  Risks and benefits are discussed and informed consent is obtained.  Description of the Procedure:  The patient's right neck, chest and existing catheter were sterilely prepped and draped. The area around the catheter was anesthetized copiously with 1% lidocaine. The catheter was dissected out with curved hemostats until the cuff was freed from the surrounding fibrous sheath. The fiber sheath was transected, and the catheter was then removed in its entirety using gentle traction. Pressure was held and sterile dressings were placed. The patient tolerated the procedure well and was taken to the recovery room in stable condition.     Festus BarrenJason Cristine Daw  09/01/2016, 10:33 AM This note was created with Dragon Medical transcription system. Any errors in dictation are purely unintentional.

## 2016-09-01 NOTE — Discharge Instructions (Signed)
Incision Care, Adult °An incision is a cut that a doctor makes in your skin for surgery (for a procedure). Most times, these cuts are closed after surgery. Your cut from surgery may be closed with stitches (sutures), staples, skin glue, or skin tape (adhesive strips). You may need to return to your doctor to have stitches or staples taken out. This may happen many days or many weeks after your surgery. The cut needs to be well cared for so it does not get infected. °How to care for your cut °Cut care °· Follow instructions from your doctor about how to take care of your cut. Make sure you: °? Wash your hands with soap and water before you change your bandage (dressing). If you cannot use soap and water, use hand sanitizer. °? Change your bandage as told by your doctor. °? Leave stitches, skin glue, or skin tape in place. They may need to stay in place for 2 weeks or longer. If tape strips get loose and curl up, you may trim the loose edges. Do not remove tape strips completely unless your doctor says it is okay. °· Check your cut area every day for signs of infection. Check for: °? More redness, swelling, or pain. °? More fluid or blood. °? Warmth. °? Pus or a bad smell. °· Ask your doctor how to clean the cut. This may include: °? Using mild soap and water. °? Using a clean towel to pat the cut dry after you clean it. °? Putting a cream or ointment on the cut. Do this only as told by your doctor. °? Covering the cut with a clean bandage. °· Ask your doctor when you can leave the cut uncovered. °· Do not take baths, swim, or use a hot tub until your doctor says it is okay. Ask your doctor if you can take showers. You may only be allowed to take sponge baths for bathing. °Medicines °· If you were prescribed an antibiotic medicine, cream, or ointment, take the antibiotic or put it on the cut as told by your doctor. Do not stop taking or putting on the antibiotic even if your condition gets better. °· Take  over-the-counter and prescription medicines only as told by your doctor. °General instructions °· Limit movement around your cut. This helps healing. °? Avoid straining, lifting, or exercise for the first month, or for as long as told by your doctor. °? Follow instructions from your doctor about going back to your normal activities. °? Ask your doctor what activities are safe. °· Protect your cut from the sun when you are outside for the first 6 months, or for as long as told by your doctor. Put on sunscreen around the scar or cover up the scar. °· Keep all follow-up visits as told by your doctor. This is important. °Contact a doctor if: °· Your have more redness, swelling, or pain around the cut. °· You have more fluid or blood coming from the cut. °· Your cut feels warm to the touch. °· You have pus or a bad smell coming from the cut. °· You have a fever or shaking chills. °· You feel sick to your stomach (nauseous) or you throw up (vomit). °· You are dizzy. °· Your stitches or staples come undone. °Get help right away if: °· You have a red streak coming from your cut. °· Your cut bleeds through the bandage and the bleeding does not stop with gentle pressure. °· The edges of your cut open   up and separate. °· You have very bad (severe) pain. °· You have a rash. °· You are confused. °· You pass out (faint). °· You have trouble breathing and you have a fast heartbeat. °This information is not intended to replace advice given to you by your health care provider. Make sure you discuss any questions you have with your health care provider. °Document Released: 11/22/2011 Document Revised: 05/07/2016 Document Reviewed: 05/07/2016 °Elsevier Interactive Patient Education © 2017 [employer] Inc[employer] ° °

## 2016-09-01 NOTE — H&P (Signed)
[facility] History & Physical Update  The patient was interviewed and re-examined.  The patient's previous History and Physical has been reviewed and is unchanged.  There is no change in the plan of care. We plan to proceed with the scheduled procedure.  Festus BarrenJason Kayann Maj, MD  09/01/2016, 9:10 AM

## 2016-09-11 NOTE — Progress Notes (Signed)
   07/30/16 1707  Acute Rehab PT Goals  Patient Stated Goal Get stronger to walk better  PT Goal Formulation With patient  Time For Goal Achievement 08/12/16  Potential to Achieve Goals Good  PT Time Calculation  PT Start Time (ACUTE ONLY) 1437  PT Stop Time (ACUTE ONLY) 1502  PT Time Calculation (min) (ACUTE ONLY) 25 min  PT G-Codes **NOT FOR INPATIENT CLASS**  Functional Assessment Tool Used Clinical reasoning  Functional Limitation Mobility: Walking and moving around  Mobility: Walking and Moving Around Current Status (Z6109(G8978) CJ  Mobility: Walking and Moving Around Goal Status (U0454(G8979) CI  PT General Charges  $$ ACUTE PT VISIT 1 Procedure  PT Evaluation  $PT Eval Low Complexity 1 Procedure  PT Treatments  $Therapeutic Exercise 8-22 mins   Late entry G code secondary to change in admission status D. Scott Prinston Kynard PT, DPT 09/11/16, 1:15 PM

## 2016-09-16 ENCOUNTER — Encounter: Payer: Self-pay | Admitting: Vascular Surgery

## 2017-02-14 ENCOUNTER — Encounter (INDEPENDENT_AMBULATORY_CARE_PROVIDER_SITE_OTHER): Payer: Medicare HMO

## 2017-02-14 ENCOUNTER — Ambulatory Visit (INDEPENDENT_AMBULATORY_CARE_PROVIDER_SITE_OTHER): Payer: Medicare HMO | Admitting: Vascular Surgery

## 2017-02-23 ENCOUNTER — Encounter: Payer: Self-pay | Admitting: Emergency Medicine

## 2017-02-23 ENCOUNTER — Inpatient Hospital Stay
Admission: EM | Admit: 2017-02-23 | Discharge: 2017-02-24 | DRG: 640 | Disposition: A | Discharge status: Home / Self Care | Payer: Medicare Other | Attending: Internal Medicine | Admitting: Internal Medicine

## 2017-02-23 ENCOUNTER — Emergency Department: Payer: Medicare Other

## 2017-02-23 DIAGNOSIS — I1311 Hypertensive heart and chronic kidney disease without heart failure, with stage 5 chronic kidney disease, or end stage renal disease: Secondary | ICD-10-CM | POA: Diagnosis present

## 2017-02-23 DIAGNOSIS — Z7982 Long term (current) use of aspirin: Secondary | ICD-10-CM | POA: Diagnosis not present

## 2017-02-23 DIAGNOSIS — Z66 Do not resuscitate: Secondary | ICD-10-CM | POA: Diagnosis not present

## 2017-02-23 DIAGNOSIS — Z881 Allergy status to other antibiotic agents status: Secondary | ICD-10-CM | POA: Diagnosis not present

## 2017-02-23 DIAGNOSIS — N186 End stage renal disease: Secondary | ICD-10-CM | POA: Diagnosis present

## 2017-02-23 DIAGNOSIS — F329 Major depressive disorder, single episode, unspecified: Secondary | ICD-10-CM | POA: Diagnosis not present

## 2017-02-23 DIAGNOSIS — E875 Hyperkalemia: Principal | ICD-10-CM

## 2017-02-23 DIAGNOSIS — Z8249 Family history of ischemic heart disease and other diseases of the circulatory system: Secondary | ICD-10-CM | POA: Diagnosis not present

## 2017-02-23 DIAGNOSIS — I248 Other forms of acute ischemic heart disease: Secondary | ICD-10-CM | POA: Diagnosis present

## 2017-02-23 DIAGNOSIS — Z9115 Patient's noncompliance with renal dialysis: Secondary | ICD-10-CM | POA: Diagnosis not present

## 2017-02-23 DIAGNOSIS — R531 Weakness: Secondary | ICD-10-CM | POA: Diagnosis present

## 2017-02-23 DIAGNOSIS — D631 Anemia in chronic kidney disease: Secondary | ICD-10-CM | POA: Diagnosis present

## 2017-02-23 DIAGNOSIS — F17200 Nicotine dependence, unspecified, uncomplicated: Secondary | ICD-10-CM | POA: Diagnosis not present

## 2017-02-23 DIAGNOSIS — J449 Chronic obstructive pulmonary disease, unspecified: Secondary | ICD-10-CM | POA: Diagnosis not present

## 2017-02-23 DIAGNOSIS — M109 Gout, unspecified: Secondary | ICD-10-CM | POA: Diagnosis present

## 2017-02-23 DIAGNOSIS — R799 Abnormal finding of blood chemistry, unspecified: Secondary | ICD-10-CM

## 2017-02-23 DIAGNOSIS — R350 Frequency of micturition: Secondary | ICD-10-CM | POA: Diagnosis present

## 2017-02-23 DIAGNOSIS — N2581 Secondary hyperparathyroidism of renal origin: Secondary | ICD-10-CM | POA: Diagnosis present

## 2017-02-23 DIAGNOSIS — Z992 Dependence on renal dialysis: Secondary | ICD-10-CM | POA: Diagnosis not present

## 2017-02-23 DIAGNOSIS — Z9114 Patient's other noncompliance with medication regimen: Secondary | ICD-10-CM | POA: Diagnosis not present

## 2017-02-23 DIAGNOSIS — R5383 Other fatigue: Secondary | ICD-10-CM

## 2017-02-23 DIAGNOSIS — Z79899 Other long term (current) drug therapy: Secondary | ICD-10-CM

## 2017-02-23 LAB — URINALYSIS, COMPLETE (UACMP) WITH MICROSCOPIC
BACTERIA UA: NONE SEEN
Bilirubin Urine: NEGATIVE
Glucose, UA: NEGATIVE mg/dL
HGB URINE DIPSTICK: NEGATIVE
Ketones, ur: NEGATIVE mg/dL
Leukocytes, UA: NEGATIVE
Nitrite: NEGATIVE
Protein, ur: 100 mg/dL — AB
SPECIFIC GRAVITY, URINE: 1.011 (ref 1.005–1.030)
Squamous Epithelial / LPF: NONE SEEN
pH: 5 (ref 5.0–8.0)

## 2017-02-23 LAB — CBC
HEMATOCRIT: 35.5 % — AB (ref 40.0–52.0)
HEMOGLOBIN: 11.6 g/dL — AB (ref 13.0–18.0)
MCH: 30.3 pg (ref 26.0–34.0)
MCHC: 32.8 g/dL (ref 32.0–36.0)
MCV: 92.5 fL (ref 80.0–100.0)
Platelets: 157 10*3/uL (ref 150–440)
RBC: 3.83 MIL/uL — AB (ref 4.40–5.90)
RDW: 13.8 % (ref 11.5–14.5)
WBC: 4.1 10*3/uL (ref 3.8–10.6)

## 2017-02-23 LAB — BASIC METABOLIC PANEL
Anion gap: 10 (ref 5–15)
BUN: 73 mg/dL — ABNORMAL HIGH (ref 6–20)
CO2: 22 mmol/L (ref 22–32)
Calcium: 9.3 mg/dL (ref 8.9–10.3)
Chloride: 109 mmol/L (ref 101–111)
Creatinine, Ser: 8.35 mg/dL — ABNORMAL HIGH (ref 0.61–1.24)
GFR calc non Af Amer: 5 mL/min — ABNORMAL LOW (ref >60)
GFR, EST AFRICAN AMERICAN: 6 mL/min — AB (ref >60)
Glucose, Bld: 78 mg/dL (ref 65–99)
POTASSIUM: 5.7 mmol/L — AB (ref 3.5–5.1)
SODIUM: 141 mmol/L (ref 135–145)

## 2017-02-23 LAB — TROPONIN I: Troponin I: 0.05 ng/mL (ref <0.03)

## 2017-02-23 LAB — MRSA PCR SCREENING: MRSA BY PCR: NEGATIVE

## 2017-02-23 MED ORDER — CALCIUM GLUCONATE 10 % IV SOLN
INTRAVENOUS | Status: AC
  Filled 2017-02-23: qty 10

## 2017-02-23 MED ORDER — AMLODIPINE BESYLATE 5 MG PO TABS
5.0000 mg | ORAL_TABLET | ORAL | Status: DC
  Administered 2017-02-24: 5 mg via ORAL
  Filled 2017-02-23: qty 1

## 2017-02-23 MED ORDER — HEPARIN SODIUM (PORCINE) 5000 UNIT/ML IJ SOLN
5000.0000 [IU] | Freq: Three times a day (TID) | INTRAMUSCULAR | Status: DC
  Administered 2017-02-23 – 2017-02-24 (×2): 5000 [IU] via SUBCUTANEOUS
  Filled 2017-02-23 (×2): qty 1

## 2017-02-23 MED ORDER — ASPIRIN EC 81 MG PO TBEC
81.0000 mg | DELAYED_RELEASE_TABLET | ORAL | Status: DC
  Administered 2017-02-24: 81 mg via ORAL
  Filled 2017-02-23: qty 1

## 2017-02-23 MED ORDER — CALCIUM ACETATE (PHOS BINDER) 667 MG PO CAPS
1334.0000 mg | ORAL_CAPSULE | Freq: Three times a day (TID) | ORAL | Status: DC
  Administered 2017-02-24: 1334 mg via ORAL
  Filled 2017-02-23: qty 2

## 2017-02-23 MED ORDER — HYDRALAZINE HCL 20 MG/ML IJ SOLN: 10.0000 mg | Freq: Four times a day (QID) | INTRAMUSCULAR | Status: DC | PRN

## 2017-02-23 MED ORDER — TRAMADOL HCL 50 MG PO TABS: 50.0000 mg | ORAL_TABLET | Freq: Four times a day (QID) | ORAL | Status: DC | PRN

## 2017-02-23 MED ORDER — ALBUTEROL SULFATE (2.5 MG/3ML) 0.083% IN NEBU: 2.5000 mg | INHALATION_SOLUTION | Freq: Every day | RESPIRATORY_TRACT | Status: DC | PRN

## 2017-02-23 MED ORDER — ACETAMINOPHEN 325 MG PO TABS: 650.0000 mg | ORAL_TABLET | Freq: Four times a day (QID) | ORAL | Status: DC | PRN

## 2017-02-23 MED ORDER — SODIUM CHLORIDE 0.9 % IV SOLN
1.0000 g | Freq: Once | INTRAVENOUS | Status: AC
  Administered 2017-02-23: 1 g via INTRAVENOUS
  Filled 2017-02-23: qty 10

## 2017-02-23 MED ORDER — ACETAMINOPHEN 650 MG RE SUPP: 650.0000 mg | Freq: Four times a day (QID) | RECTAL | Status: DC | PRN

## 2017-02-23 MED ORDER — ALUM & MAG HYDROXIDE-SIMETH 200-200-20 MG/5ML PO SUSP
30.0000 mL | ORAL | Status: DC | PRN
  Administered 2017-02-24: 30 mL via ORAL
  Filled 2017-02-23: qty 30

## 2017-02-23 MED ORDER — SENNOSIDES-DOCUSATE SODIUM 8.6-50 MG PO TABS: 1.0000 | ORAL_TABLET | Freq: Every evening | ORAL | Status: DC | PRN

## 2017-02-23 MED ORDER — DOCUSATE SODIUM 100 MG PO CAPS
100.0000 mg | ORAL_CAPSULE | Freq: Two times a day (BID) | ORAL | Status: DC
  Administered 2017-02-23: 100 mg via ORAL
  Filled 2017-02-23: qty 1

## 2017-02-23 MED ORDER — CLONIDINE HCL 0.1 MG PO TABS
0.1000 mg | ORAL_TABLET | Freq: Two times a day (BID) | ORAL | Status: DC
  Administered 2017-02-23 – 2017-02-24 (×2): 0.1 mg via ORAL
  Filled 2017-02-23 (×2): qty 1

## 2017-02-23 MED ORDER — ONDANSETRON HCL 4 MG/2ML IJ SOLN: 4.0000 mg | Freq: Four times a day (QID) | INTRAMUSCULAR | Status: DC | PRN

## 2017-02-23 MED ORDER — ONDANSETRON HCL 4 MG PO TABS: 4.0000 mg | ORAL_TABLET | Freq: Four times a day (QID) | ORAL | Status: DC | PRN

## 2017-02-23 MED ORDER — CLONIDINE HCL 0.1 MG PO TABS
0.1000 mg | ORAL_TABLET | ORAL | Status: AC
  Administered 2017-02-23: 0.1 mg via ORAL
  Filled 2017-02-23: qty 1

## 2017-02-23 NOTE — ED Provider Notes (Signed)
[facility] Emergency Department Provider Note   ____________________________________________   None    (approximate)  I have reviewed the triage vital signs and the nursing notes.   HISTORY  Chief Complaint Fatigue and Urinary Frequency    HPI Eric Yu is a 83 y.o. male here because of missed dialysis  Patient reports he didn't fatigue, felt too tired last week and a half to get on the bus to make it to dialysis. He finally went today, there he was told he come emergency room for further evaluation because of his missing several sessions He does report feeling weak and tired, feeling generally tired and describes a feeling like his muscles just feel very tired for about the last week and half  No pain. Had some increased urinary frequency last week, reports he seemed to improved along with his energy level  No chest pain. No nausea or vomiting. No headache. No weakness in an arm or leg. No trouble speaking.   Past Medical History:  Diagnosis Date  . Anemia   . Chronic kidney disease   . COPD (chronic obstructive pulmonary disease) (HCC)   . Depression   . Gout   . Hypertension   . Iron deficiency     Patient Active Problem List   Diagnosis Date Noted  . Hyperkalemia 07/29/2016  . Generalized weakness   . Palliative care by specialist   . DNR (do not resuscitate)   . End stage renal disease (HCC)   . Symptomatic anemia 05/08/2016  . Severe major depression, single episode (HCC) 04/28/2016  . Injury of kidney 01/08/2015  . Blood loss, postoperative 01/06/2015  . Hypertensive left ventricular hypertrophy 12/26/2014  . Gout 01/16/2014  . Benign hypertension 05/19/2012  . Current tobacco use 05/19/2012    Past Surgical History:  Procedure Laterality Date  . AV FISTULA PLACEMENT Left 07/23/2016   Procedure: INSERTION OF ARTERIOVENOUS (AV) GORE-TEX GRAFT ARM;  Surgeon: Renford Dills, MD;  Location: [facility] ORS;  Service: Vascular;   Laterality: Left;  . NO PAST SURGERIES    . PERIPHERAL VASCULAR CATHETERIZATION N/A 04/26/2016   Procedure: Dialysis/Perma Catheter Insertion;  Surgeon: Annice Needy, MD;  Location: [facility] INVASIVE CV LAB;  Service: Cardiovascular;  Laterality: N/A;  . THYROIDECTOMY      Prior to Admission medications   Medication Sig Start Date End Date Taking? Authorizing Provider  albuterol (PROVENTIL HFA;VENTOLIN HFA) 108 (90 Base) MCG/ACT inhaler Inhale 2 puffs into the lungs every morning.    Yes [provider]  aspirin EC 81 MG tablet Take 81 mg by mouth every morning.    Yes [provider]  cloNIDine (CATAPRES) 0.1 MG tablet Take 0.1 mg by mouth 2 (two) times daily.  07/05/16  Yes [provider]  lidocaine-prilocaine (EMLA) cream Apply 1 application topically every dialysis. 12/30/16  Yes [provider]  senna-docusate (SENOKOT-S) 8.6-50 MG tablet Take 1 tablet by mouth at bedtime as needed for mild constipation. 07/30/16  Yes Enid Baas, MD  amLODipine (NORVASC) 10 MG tablet Take 5 mg by mouth every morning.     [provider]  calcium acetate (PHOSLO) 667 MG capsule Take 1,334 mg by mouth 3 (three) times daily with meals. 09/17/16 09/17/17  [provider]  hydrALAZINE (APRESOLINE) 25 MG tablet Take 1 tablet (25 mg total) by mouth 3 (three) times daily. Patient not taking: Reported on 02/23/2017 07/30/16   Enid Baas, MD  HYDROcodone-acetaminophen [facility]) 5-325 MG tablet Take 1-2 tablets  by mouth every 6 (six) hours as needed for moderate pain or severe pain. Patient not taking: Reported on 02/23/2017 07/23/16   Schnier, Latina Craver, MD  isosorbide mononitrate (IMDUR) 30 MG 24 hr tablet Take 1 tablet (30 mg total) by mouth at bedtime. Patient not taking: Reported on 02/23/2017 07/30/16   Enid Baas, MD  torsemide (DEMADEX) 20 MG tablet Take 1 tablet (20 mg total) by mouth daily. Patient not taking: Reported on 02/23/2017 07/31/16    Enid Baas, MD    Allergies Cephalexin  Family History  Problem Relation Age of Onset  . Cancer Sister   . Hypertension Sister   . Urolithiasis Neg Hx   . Prostate cancer Neg Hx   . Kidney disease Neg Hx   . Kidney cancer Neg Hx     Social History Social History  Substance Use Topics  . Smoking status: Current Some Day Smoker  . Smokeless tobacco: Never Used     Comment: pt is currently using nicotine patches  . Alcohol use No    Review of Systems Constitutional: No fever/chills Eyes: No visual changes. ENT: No sore throat. Cardiovascular: Denies chest pain. Respiratory: Denies shortness of breath. Gastrointestinal: No abdominal pain.  No nausea, no vomiting.  No diarrhea.  No constipation. Genitourinary: See history of present illness, increased frequency and slight change in urine over the last week and half Musculoskeletal: Negative for back pain. Skin: Negative for rash. Neurological: Negative for headaches, focal weakness or numbness.    ____________________________________________   PHYSICAL EXAM:  VITAL SIGNS: ED Triage Vitals  Enc Vitals Group     BP 02/23/17 1041 (!) 192/78     Pulse Rate 02/23/17 1041 70     Resp 02/23/17 1041 16     Temp 02/23/17 1041 98.1 F (36.7 C)     Temp Source 02/23/17 1041 Oral     SpO2 02/23/17 1041 99 %     Weight 02/23/17 1038 161 lb (73 kg)     Height 02/23/17 1038 5\' 8"  (1.727 m)     Head Circumference --      Peak Flow --      Pain Score --      Pain Loc --      Pain Edu? --      Excl. in GC? --     Constitutional: Alert and oriented. Well appearing and in no acute distress. Eyes: Conjunctivae are normal. Head: Atraumatic. Nose: No congestion/rhinnorhea. Mouth/Throat: Mucous membranes are Dry. Neck: No stridor.   Cardiovascular: Bradycardic rate, regular rhythm. Grossly normal heart sounds.  Good peripheral circulation. Respiratory: Normal respiratory effort.  No retractions. Lungs  CTAB. Gastrointestinal: Soft and nontender. No distention. Musculoskeletal: No lower extremity tenderness nor edema. Neurologic:  Normal speech and language. No gross focal neurologic deficits are appreciated. Moves all extremities normally. No facial droop. Clear speech. Skin:  Skin is warm, dry and intact. No rash noted. Psychiatric: Mood and affect are normal. Speech and behavior are normal.  ____________________________________________   LABS (all labs ordered are listed, but only abnormal results are displayed)  Labs Reviewed  BASIC METABOLIC PANEL - Abnormal; Notable for the following:       Result Value   Potassium 5.7 (*)    BUN 73 (*)    Creatinine, Ser 8.35 (*)    GFR calc non Af Amer 5 (*)    GFR calc Af Amer 6 (*)    All other components within normal limits  CBC - Abnormal; Notable  for the following:    RBC 3.83 (*)    Hemoglobin 11.6 (*)    HCT 35.5 (*)    All other components within normal limits  URINE CULTURE  URINALYSIS, COMPLETE (UACMP) WITH MICROSCOPIC  TROPONIN I   ____________________________________________  EKG  Reviewed and interpreted by me at 10:50 AM Heart rate 45 QRS 100 QTc 420 Sinus bradycardia, possible old anterior infarct, no evidence of acute ischemia or hyperkalemia noted  EKG from 10:40 AM reviewed Heart rate 70 QRS 100 QTc 470 Normal sinus rhythm, evidence of 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  Patient seen by nephrology at 1120AM, Dr. Ronn MelenaKolloru. He advises the  patient requires admission for missed dialysis, reinitiation of dialysis, elevated BUN.  Patient is found to be hyperkalemic, elevated and set elevated BUN without obvious encephalopathy though this may be contributing to significant fatigue.  Patient is bradycardic, but demonstrates sinus bradycardia.  ----------------------------------------- 11:41 AM on 02/23/2017 -----------------------------------------  Resting comfortably, no distress. Admitting hospitalist service      ____________________________________________   FINAL CLINICAL IMPRESSION(S) / ED DIAGNOSES  Final diagnoses:  Hyperkalemia  Fatigue, unspecified type  Elevated BUN      NEW MEDICATIONS STARTED DURING THIS VISIT:  New Prescriptions   No medications on file     Note:  This document was prepared using Dragon voice recognition software and may include unintentional dictation errors.     Sharyn CreamerQuale, Zailen Albarran, MD 02/23/17 [phone]

## 2017-02-23 NOTE — ED Notes (Addendum)
Spoke with Dr. Allena KatzPatel re: blood pressure elevated. Give Clonidine now. Ok to move to the floor.

## 2017-02-23 NOTE — Progress Notes (Signed)
Pt stated that his POA is [name], his sister, best number to reach her at is [phone].

## 2017-02-23 NOTE — Progress Notes (Signed)
[name]  ROUNDING NOTE   Subjective:   [name] [facility] to [facility] on 02/23/2017 for Hyperkalemia [E87.5] Elevated BUN [R79.9] Fatigue, unspecified type [R53.83]  Patient has missed over a week of outpatient hemodialysis. He states he felt weak and tired and did not feel like going.   Objective:  Vital signs in last 24 hours:  Temp:  [98.1 F (36.7 C)] 98.1 F (36.7 C) (06/13 1041) Pulse Rate:  [53-70] 56 (06/13 1300) Resp:  [11-16] 12 (06/13 1300) BP: (167-192)/(69-78) 182/70 (06/13 1300) SpO2:  [98 %-100 %] 99 % (06/13 1300) Weight:  [73 kg (161 lb)] 73 kg (161 lb) (06/13 1038)  Weight change:  Filed Weights   02/23/17 1038  Weight: 73 kg (161 lb)    Intake/Output: No intake/output data recorded.   Intake/Output this shift:  No intake/output data recorded.  Physical Exam: General: NAD,   Head: Normocephalic, atraumatic. Moist oral mucosal membranes  Eyes: Anicteric, PERRL  Neck: Supple, trachea midline  Lungs:  Clear to auscultation  Heart: Regular rate and rhythm  Abdomen:  Soft, nontender,   Extremities: no peripheral edema.  Neurologic: Nonfocal, moving all four extremities  Skin: No lesions  Access: Left AVF    Basic Metabolic Panel:  Recent Labs Lab 02/23/17 1041  NA 141  K 5.7*  CL 109  CO2 22  GLUCOSE 78  BUN 73*  CREATININE 8.35*  CALCIUM 9.3    Liver Function Tests: No results for input(s): AST, ALT, ALKPHOS, BILITOT, PROT, ALBUMIN in the last 168 hours. No results for input(s): LIPASE, AMYLASE in the last 168 hours. No results for input(s): AMMONIA in the last 168 hours.  CBC:  Recent Labs Lab 02/23/17 1041  WBC 4.1  HGB 11.6*  HCT 35.5*  MCV 92.5  PLT 157    Cardiac Enzymes:  Recent Labs Lab 02/23/17 1041  TROPONINI 0.05*    BNP: Invalid input(s): POCBNP  CBG: No results for input(s): GLUCAP in the last 168 hours.  Microbiology: Results for orders placed or performed during the hospital  encounter of 07/23/16  Surgical pcr screen     Status: None   Collection Time: 07/23/16  8:42 AM  Result Value Ref Range Status   MRSA, PCR NEGATIVE NEGATIVE Final   Staphylococcus aureus NEGATIVE NEGATIVE Final    Comment:        The Xpert SA Assay (FDA approved for NASAL specimens in patients over 81 years of age), is one component of a comprehensive surveillance program.  Test performance has been validated by [facility] for patients greater than or equal to 81 year old. It is not intended to diagnose infection nor to guide or monitor treatment.     Coagulation Studies: No results for input(s): LABPROT, INR in the last 72 hours.  Urinalysis:  Recent Labs  02/23/17 1041  COLORURINE YELLOW*  LABSPEC 1.011  PHURINE 5.0  GLUCOSEU NEGATIVE  HGBUR NEGATIVE  BILIRUBINUR NEGATIVE  KETONESUR NEGATIVE  PROTEINUR 100*  NITRITE NEGATIVE  LEUKOCYTESUR NEGATIVE      Imaging: Dg Chest Portable 1 View  Result Date: 02/23/2017 CLINICAL DATA:  Fatigue with urinary frequency and pain for 1 week. The patient missed his last 3 dialysis appointments. EXAM: PORTABLE CHEST 1 VIEW COMPARISON:  Single-view of the chest 07/29/2016 and 05/08/2016. FINDINGS: There is cardiomegaly with hazy bilateral opacities likely due to interstitial edema. No pneumothorax or pleural fluid. Atherosclerosis noted. IMPRESSION: Cardiomegaly and pulmonary edema. Atherosclerosis. Electronically Signed   By: Maisie Fushomas  Dalessio M.D.   On: 02/23/2017 11:33     Medications:    . [START ON 02/24/2017] albuterol  2 puff Inhalation BH-q7a  . [START ON 02/24/2017] amLODipine  5 mg Oral BH-q7a  . [START ON 02/24/2017] aspirin EC  81 mg Oral BH-q7a  . calcium acetate  1,334 mg Oral TID WC  . cloNIDine  0.1 mg Oral BID  . docusate sodium  100 mg Oral BID  . heparin  5,000 Units Subcutaneous Q8H   acetaminophen **OR** acetaminophen, ondansetron **OR** ondansetron (ZOFRAN) IV, senna-docusate, traMADol  Assessment/  Plan:  [name] is a 83 y.o. black male with hypertension, gout, anemia, depression, COPD and tobacco abuse   MWF [facility] left AV[facility].   1. End-stage renal disease followed by [facility] nephrology. Missed one week of hemodialysis.  - Resume dialysis today, orders prepared. AVF  2. Anemia of chronic kidney disease: with iron deficiency. Hemoglobin 11.6 - hold epo  3. Secondary hyperparathyroidism: calcium at goal.  - monitor phos level - Calcium acetate with meals.   4. Hypertension: blood pressure elevated today.  - amlodipine and clonidine.    LOS: 0 Abrianna Sidman 6/13/20182:24 PM

## 2017-02-23 NOTE — H&P (Signed)
[facility] - [place] at [facility]   PATIENT NAME: Eric Yu    MR#:  [id number]  DATE OF BIRTH:  05-May-1934  DATE OF ADMISSION:  02/23/2017  PRIMARY CARE PHYSICIAN: Lauro Regulus, MD   REQUESTING/REFERRING PHYSICIAN: Dr Don Perking  CHIEF COMPLAINT:  I feel weak and tired. Unable to go to dialysis all last week  HISTORY OF PRESENT ILLNESS:  Eric Yu  is a 83 y.o. male with a known history of End stage renal disease on hemodialysis, COPD, gout, hypertension comes to the emergency room after feeling fatigued and tired and unable to go for dialysis last week. Patient missed 3 treatments last week. He has potassium of 5.7 EKG changes he is being admitted for further evaluation and management Patient was seen by nephrology in the ER and will be dialyzed today  PAST MEDICAL HISTORY:   Past Medical History:  Diagnosis Date  . Anemia   . Chronic kidney disease   . COPD (chronic obstructive pulmonary disease) (HCC)   . Depression   . Gout   . Hypertension   . Iron deficiency     PAST SURGICAL HISTOIRY:   Past Surgical History:  Procedure Laterality Date  . AV FISTULA PLACEMENT Left 07/23/2016   Procedure: INSERTION OF ARTERIOVENOUS (AV) GORE-TEX GRAFT ARM;  Surgeon: Renford Dills, MD;  Location: [facility] ORS;  Service: Vascular;  Laterality: Left;  . NO PAST SURGERIES    . PERIPHERAL VASCULAR CATHETERIZATION N/A 04/26/2016   Procedure: Dialysis/Perma Catheter Insertion;  Surgeon: Annice Needy, MD;  Location: [facility] INVASIVE CV LAB;  Service: Cardiovascular;  Laterality: N/A;  . THYROIDECTOMY      SOCIAL HISTORY:   Social History  Substance Use Topics  . Smoking status: Current Some Day Smoker  . Smokeless tobacco: Never Used     Comment: pt is currently using nicotine patches  . Alcohol use No    FAMILY HISTORY:   Family History  Problem Relation Age of Onset  . Cancer Sister   . Hypertension Sister   . Urolithiasis Neg Hx   .  Prostate cancer Neg Hx   . Kidney disease Neg Hx   . Kidney cancer Neg Hx     DRUG ALLERGIES:   Allergies  Allergen Reactions  . Cephalexin     Other reaction(s): Other (See Comments) Gross hematuria - occurred once. Not certain whether it was coincidence or causative.    REVIEW OF SYSTEMS:  Review of Systems  Constitutional: Positive for malaise/fatigue. Negative for chills, fever and weight loss.  HENT: Negative for ear discharge, ear pain and nosebleeds.   Eyes: Negative for blurred vision, pain and discharge.  Respiratory: Negative for sputum production, shortness of breath, wheezing and stridor.   Cardiovascular: Negative for chest pain, palpitations, orthopnea and PND.  Gastrointestinal: Negative for abdominal pain, diarrhea, nausea and vomiting.  Genitourinary: Negative for frequency and urgency.  Musculoskeletal: Negative for back pain and joint pain.  Neurological: Positive for weakness. Negative for sensory change, speech change and focal weakness.  Psychiatric/Behavioral: Negative for depression and hallucinations. The patient is not nervous/anxious.      MEDICATIONS AT HOME:   Prior to Admission medications   Medication Sig Start Date End Date Taking? Authorizing Provider  albuterol (PROVENTIL HFA;VENTOLIN HFA) 108 (90 Base) MCG/ACT inhaler Inhale 2 puffs into the lungs every morning.    Yes [provider]  aspirin EC 81 MG tablet Take 81 mg by mouth every morning.    Yes  [provider]  cloNIDine (CATAPRES) 0.1 MG tablet Take 0.1 mg by mouth 2 (two) times daily.  07/05/16  Yes [provider]  lidocaine-prilocaine (EMLA) cream Apply 1 application topically every dialysis. 12/30/16  Yes [provider]  senna-docusate (SENOKOT-S) 8.6-50 MG tablet Take 1 tablet by mouth at bedtime as needed for mild constipation. 07/30/16  Yes Enid BaasKalisetti, Radhika, MD  amLODipine (NORVASC) 10 MG tablet Take 5 mg by mouth every morning.     [provider]  calcium acetate (PHOSLO) 667 MG capsule Take 1,334 mg by mouth 3 (three) times daily with meals. 09/17/16 09/17/17  [provider]  hydrALAZINE (APRESOLINE) 25 MG tablet Take 1 tablet (25 mg total) by mouth 3 (three) times daily. Patient not taking: Reported on 02/23/2017 07/30/16   Enid BaasKalisetti, Radhika, MD  HYDROcodone-acetaminophen [facility](NORCO) 5-325 MG tablet Take 1-2 tablets by mouth every 6 (six) hours as needed for moderate pain or severe pain. Patient not taking: Reported on 02/23/2017 07/23/16   Schnier, Latina CraverGregory G, MD  isosorbide mononitrate (IMDUR) 30 MG 24 hr tablet Take 1 tablet (30 mg total) by mouth at bedtime. Patient not taking: Reported on 02/23/2017 07/30/16   Enid BaasKalisetti, Radhika, MD  torsemide (DEMADEX) 20 MG tablet Take 1 tablet (20 mg total) by mouth daily. Patient not taking: Reported on 02/23/2017 07/31/16   Enid BaasKalisetti, Radhika, MD      VITAL SIGNS:  Blood pressure (!) 211/57, pulse 60, temperature 98 F (36.7 C), temperature source Oral, resp. rate 20, height 5\' 8"  (1.727 m), weight 71.2 kg (156 lb 14.4 oz), SpO2 100 %.  PHYSICAL EXAMINATION:  GENERAL:  83 y.o.-year-old patient lying in the bed with no acute distress. Appears chronically ill EYES: Pupils equal, round, reactive to light and accommodation. No scleral icterus. Extraocular muscles intact.  HEENT: Head atraumatic, normocephalic. Oropharynx and nasopharynx clear.  NECK:  Supple, no jugular venous distention. No thyroid enlargement, no tenderness.  LUNGS: Normal breath sounds bilaterally, no wheezing, rales,rhonchi or crepitation. No use of accessory muscles of respiration.  CARDIOVASCULAR: S1, S2 normal. No murmurs, rubs, or gallops.  ABDOMEN: Soft, nontender, nondistended. Bowel sounds present. No organomegaly or mass.  EXTREMITIES: No pedal edema, cyanosis, or clubbing.  NEUROLOGIC: Cranial nerves II through XII are intact. Muscle strength 5/5 in all extremities. Sensation intact. Gait not  checked. Weak PSYCHIATRIC: The patient is alert and oriented x 3.  SKIN: No obvious rash, lesion, or ulcer.   LABORATORY PANEL:   CBC  Recent Labs Lab 02/23/17 1041  WBC 4.1  HGB 11.6*  HCT 35.5*  PLT 157   ------------------------------------------------------------------------------------------------------------------  Chemistries   Recent Labs Lab 02/23/17 1041  NA 141  K 5.7*  CL 109  CO2 22  GLUCOSE 78  BUN 73*  CREATININE 8.35*  CALCIUM 9.3   ------------------------------------------------------------------------------------------------------------------  Cardiac Enzymes  Recent Labs Lab 02/23/17 1041  TROPONINI 0.05*   ------------------------------------------------------------------------------------------------------------------  RADIOLOGY:  Dg Chest Portable 1 View  Result Date: 02/23/2017 CLINICAL DATA:  Fatigue with urinary frequency and pain for 1 week. The patient missed his last 3 dialysis appointments. EXAM: PORTABLE CHEST 1 VIEW COMPARISON:  Single-view of the chest 07/29/2016 and 05/08/2016. FINDINGS: There is cardiomegaly with hazy bilateral opacities likely due to interstitial edema. No pneumothorax or pleural fluid. Atherosclerosis noted. IMPRESSION: Cardiomegaly and pulmonary edema. Atherosclerosis. Electronically Signed   By: Drusilla Kannerhomas  Dalessio M.D.   On: 02/23/2017 11:33    EKG:    IMPRESSION AND PLAN:   Eric HalsJohn Yu  is a  83 y.o. male with a known history of End stage renal disease on hemodialysis, COPD, gout, hypertension comes to the emergency room after feeling fatigued and tired and unable to go for dialysis last week. Patient missed 3 treatments last week.  1 generalized weakness/fatigue/malaise secondary to missing dialysis all last week. Patient missed 3 treatments -Admit to medical floor -Nephrology consultation -Patient to be started on dialysis today.  2. Hyperkalemia secondary to missing dialysis. No EKG changes with T  waves -Patient to be dialyzed today. -Follow up potassium  3. Malignant hypertension -Not sure if patient is taking his medications correctly although he states he has not taken today his BP meds and else is taking it -Resume home meds -Patient will benefit from home health RN  4. COPD -Stable -Oxygen as needed when necessary nebs  5. DVT prophylaxis -Subcutaneous heparin    All the records are reviewed and case discussed with ED provider. Management plans discussed with the patient, family and they are in agreement.  CODE STATUS: Full  TOTAL TIME TAKING CARE OF THIS PATIENT: 50 minutes.    Silvestre Mines M.D on 02/23/2017 at 3:05 PM  Between 7am to 6pm - Pager - [phone]  After 6pm go to [email or web address] - password [email or web address] [facility]  [facility]  Office  [phone]  CC: Primary care physician; Lauro Regulus, MD

## 2017-02-23 NOTE — ED Notes (Signed)
Hooked patient up to the monitor

## 2017-02-23 NOTE — Progress Notes (Addendum)
MD notified of pt.'s current BP at 211/50, catapres ordered at this time. Nephrology would like RN to hold catapres at this time due to pt going to dialysis today.Verbal order to place  Q6PRN hydralazine 10 mg IV if SBP >160. Pt is not expressing any pain or discomfort at this time.   Raihan Kimmel [place]

## 2017-02-23 NOTE — ED Triage Notes (Signed)
Pt to ED via EMS from home c/o fatigue and urinary frequency and pain x1 week.  Pt goes to dialysis T/TR/S but has missed his last 3 appointments due to not feeling well.  Pt presents A&Ox4, chest rise even and unlabored, left arm access with (+) thrill and bruit.  EMS vitals 99.5 temp, 89 CBG, 202/86 BP, 70 HR.

## 2017-02-23 NOTE — ED Notes (Signed)
Report to Lauren, informed her of patient's heart rate dropping into the 30s and 40s occasionally.

## 2017-02-23 NOTE — Progress Notes (Signed)
Family Meeting Note  Advance Directive:NO  Today a meeting took place with the patient  No family present in the emergency room.  The following clinical team members were present during this meeting: Patient and M.D. The following were discussed:Patient's diagnosis: , Patient's progosis: Long-term poor and Goals for treatment: Discussed with patient regarding management of high blood pressure in compliance with dialysis Discussed CODE STATUS patient wishes to be full code  Time spent during discussion:16 mins Eric Zegers, MD

## 2017-02-23 NOTE — ED Notes (Signed)
Called pharmacy to send patient medication. 

## 2017-02-24 DIAGNOSIS — R531 Weakness: Secondary | ICD-10-CM | POA: Diagnosis not present

## 2017-02-24 DIAGNOSIS — E875 Hyperkalemia: Secondary | ICD-10-CM | POA: Diagnosis not present

## 2017-02-24 LAB — URINE CULTURE
Culture: NO GROWTH
SPECIAL REQUESTS: NORMAL

## 2017-02-24 LAB — BASIC METABOLIC PANEL
ANION GAP: 6 (ref 5–15)
BUN: 35 mg/dL — ABNORMAL HIGH (ref 6–20)
CALCIUM: 8.7 mg/dL — AB (ref 8.9–10.3)
CO2: 31 mmol/L (ref 22–32)
Chloride: 101 mmol/L (ref 101–111)
Creatinine, Ser: 4.94 mg/dL — ABNORMAL HIGH (ref 0.61–1.24)
GFR calc Af Amer: 12 mL/min — ABNORMAL LOW (ref >60)
GFR, EST NON AFRICAN AMERICAN: 10 mL/min — AB (ref >60)
GLUCOSE: 79 mg/dL (ref 65–99)
Potassium: 4.1 mmol/L (ref 3.5–5.1)
SODIUM: 138 mmol/L (ref 135–145)

## 2017-02-24 LAB — HEPATITIS B SURFACE ANTIGEN: HEP B S AG: NEGATIVE

## 2017-02-24 MED ORDER — HYDRALAZINE HCL 25 MG PO TABS
25.0000 mg | ORAL_TABLET | Freq: Once | ORAL | Status: AC
  Administered 2017-02-24: 25 mg via ORAL
  Filled 2017-02-24: qty 1

## 2017-02-24 MED ORDER — TORSEMIDE 20 MG PO TABS: 20.0000 mg | ORAL_TABLET | Freq: Every day | ORAL | 0 refills | Status: AC

## 2017-02-24 MED ORDER — HYDRALAZINE HCL 25 MG PO TABS: 25.0000 mg | ORAL_TABLET | Freq: Three times a day (TID) | ORAL | 0 refills | Status: DC

## 2017-02-24 MED ORDER — AMLODIPINE BESYLATE 10 MG PO TABS: 10.0000 mg | ORAL_TABLET | ORAL | 0 refills | Status: AC

## 2017-02-24 MED ORDER — AMLODIPINE BESYLATE 5 MG PO TABS
5.0000 mg | ORAL_TABLET | Freq: Once | ORAL | Status: AC
  Administered 2017-02-24: 5 mg via ORAL
  Filled 2017-02-24: qty 1

## 2017-02-24 NOTE — Progress Notes (Signed)
Post hd assessment 

## 2017-02-24 NOTE — Progress Notes (Signed)
Pre hd assessment  

## 2017-02-24 NOTE — Progress Notes (Signed)
Pre hd info 

## 2017-02-24 NOTE — Care Management Note (Signed)
Case Management Note  Patient Details  Name: Eric Yu MRN: [id number] Date of Birth: [birth year]  Subjective/Objective:                   RNCM spoke with patient and he agrees to home health services. He states he has had it before but cannot remember name of agency. He asked that I contact his sister [name] [phone] to find out the name of the agency.Per [name] it was [facility]. He has been to [facility] and would go to SNF again but didn't like [facility].  O2 acute. PT pending. Hemodialysis patient at [facility] [address] He lives alone. "He has just give up. He can do more than he does".  [name] has been trying to get him into a home but can't afford long-term care. [name] doesn't have room for him to stay with her. He does not have any children. He has a brother and another sister "but they will not do anything for him". He typically uses a walker or a cane. [name] provides transportation. He goes to dialysis by [facility] at $6 round trip. PCP [facility].  Action/Plan: [facility] notified. RNCM will continue to follow.   Expected Discharge Date:                  Expected Discharge Plan:     In-House Referral:     Discharge planning Services  CM Consult  Post Acute Care Choice:  Home Health Choice offered to:  Patient  DME Arranged:    DME Agency:     HH Arranged:    HH Agency:     Status of Service:  In process, will continue to follow  If discussed at Long Length of Stay Meetings, dates discussed:    Additional Comments:  Collie Siadngela Julieanne Hadsall, RN 02/24/2017, 8:03 AM

## 2017-02-24 NOTE — Progress Notes (Signed)
Patient discharge teaching given, including activity, diet, follow-up appoints, and medications. Patient verbalized understanding of all discharge instructions. IV access was d/c'd. Vitals are stable. Skin is intact except as charted in most recent assessments. Pt to be escorted out by NT, to be driven home by family.  Tej Murdaugh  

## 2017-02-24 NOTE — Care Management (Signed)
Patient discharging to home today and PT had not recommendations for home health; MD did not order home health nursing either. I have [facility] [place] [facility]. It is documented that patient is no longer requiring supplemental O2 either. No further RNCM needs.

## 2017-02-24 NOTE — Progress Notes (Signed)
Post hd vitals 

## 2017-02-24 NOTE — Evaluation (Signed)
Physical Therapy Evaluation Patient Details Name: Eric Yu MRN: [id number] DOB: May 20, 1934 Today's Date: 02/24/2017   History of Present Illness  83 y.o. male with a known history of End stage renal disease on hemodialysis, COPD, gout, hypertension comes to the emergency room after feeling fatigued and tired and unable to go for dialysis last week.   Clinical Impression  Pt did well with mobility, ambulation and generally was at his baseline.  He reports he is feeling much better than when he arrived and feels he will be able to safely return home to his baseline status w/o further PT intervention.  Pt safe and appropriate t/o PT exam, no further needs.     Follow Up Recommendations No PT follow up    Equipment Recommendations       Recommendations for Other Services       Precautions / Restrictions Precautions Precautions: Fall Restrictions Weight Bearing Restrictions: No      Mobility  Bed Mobility Overal bed mobility: Independent             General bed mobility comments: Pt able to rise to EOB w/o assist  Transfers Overall transfer level: Independent Equipment used: Rolling walker (2 wheeled)             General transfer comment: Pt able to rise w/o assist, showed good confidence and safety  Ambulation/Gait Ambulation/Gait assistance: Modified independent (Device/Increase time) Ambulation Distance (Feet): 200 Feet Assistive device: Rolling walker (2 wheeled)       General Gait Details: Pt was able to maintain consistent cadence, showed no LOBs or safety issues and generally did well.    Stairs            Wheelchair Mobility    Modified Rankin (Stroke Patients Only)       Balance Overall balance assessment: Independent                                           Pertinent Vitals/Pain Pain Assessment: No/denies pain    Home Living Family/patient expects to be discharged to:: Private residence Living Arrangements:  Alone Available Help at Discharge: Personal care attendant Type of Home: House Home Access: Ramped entrance       Home Equipment: Dan HumphreysWalker - 2 wheels;Cane - single point      Prior Function Level of Independence: Needs assistance   Gait / Transfers Assistance Needed: Ind amb with RW in home           Hand Dominance        Extremity/Trunk Assessment   Upper Extremity Assessment Upper Extremity Assessment: Overall WFL for tasks assessed    Lower Extremity Assessment Lower Extremity Assessment: Overall WFL for tasks assessed       Communication   Communication: No difficulties  Cognition Arousal/Alertness: Awake/alert Behavior During Therapy: WFL for tasks assessed/performed Overall Cognitive Status: Within Functional Limits for tasks assessed                                        General Comments General comments (skin integrity, edema, etc.): Pt had nasal cannula out of nose on arrival sats were 99%, O2 sats remained in the high 90s t/o the session while on room air    Exercises     Assessment/Plan    PT  Assessment Patent does not need any further PT services  PT Problem List         PT Treatment Interventions      PT Goals (Current goals can be found in the Care Plan section)  Acute Rehab PT Goals Patient Stated Goal: go home PT Goal Formulation: All assessment and education complete, DC therapy    Frequency     Barriers to discharge        Co-evaluation               AM-PAC PT "6 Clicks" Daily Activity  Outcome Measure Difficulty turning over in bed (including adjusting bedclothes, sheets and blankets)?: None Difficulty moving from lying on back to sitting on the side of the bed? : None Difficulty sitting down on and standing up from a chair with arms (e.g., wheelchair, bedside commode, etc,.)?: None Help needed moving to and from a bed to chair (including a wheelchair)?: None Help needed walking in hospital room?:  None Help needed climbing 3-5 steps with a railing? : None 6 Click Score: 24    End of Session Equipment Utilized During Treatment: Gait belt Activity Tolerance: Patient tolerated treatment well Patient left: with nursing/sitter in room (on commode) Nurse Communication: Mobility status PT Visit Diagnosis: Muscle weakness (generalized) (M62.81);Difficulty in walking, not elsewhere classified (R26.2)    Time: 1610-9604 PT Time Calculation (min) (ACUTE ONLY): 16 min   Charges:   PT Evaluation $PT Eval Low Complexity: 1 Procedure     PT G Codes:        Malachi Pro, DPT 02/24/2017, 11:01 AM

## 2017-02-24 NOTE — Progress Notes (Signed)
  End of hd 

## 2017-02-24 NOTE — Progress Notes (Signed)
Verbal order from Dr. Wynelle LinkKolluru to hold catapres 0.1 mg at this time due to pt going to dialysis today, current BP is 171/57. Will continue to monitor pt.   Kamali Nephew [place]

## 2017-02-24 NOTE — Progress Notes (Signed)
[facility]  ROUNDING NOTE   Subjective:   Hemodialysis yesterday. Tolerated treatment well. UF of 0.5 He feels tired, weak and short of breath.   Patient for dialysis again today to get back on TTS schedule  Objective:  Vital signs in last 24 hours:  Temp:  [98 F (36.7 C)-98.9 F (37.2 C)] 98.1 F (36.7 C) (06/14 0517) Pulse Rate:  [45-62] 62 (06/14 1018) Resp:  [12-20] 18 (06/14 1018) BP: (167-211)/(57-77) 171/57 (06/14 1018) SpO2:  [98 %-100 %] 100 % (06/14 1018) Weight:  [71.2 kg (156 lb 14.4 oz)-71.7 kg (158 lb 1.1 oz)] 71.2 kg (156 lb 15.5 oz) (06/13 1918)  Weight change:  Filed Weights   02/23/17 1435 02/23/17 1500 02/23/17 1918  Weight: 71.2 kg (156 lb 14.4 oz) 71.7 kg (158 lb 1.1 oz) 71.2 kg (156 lb 15.5 oz)    Intake/Output: I/O last 3 completed shifts: In: -  Out: 500 [Other:500]   Intake/Output this shift:  Total I/O In: 240 [P.O.:240] Out: -   Physical Exam: General: NAD,   Head: Normocephalic, atraumatic. Moist oral mucosal membranes  Eyes: Anicteric, PERRL  Neck: Supple, trachea midline  Lungs:  Clear to auscultation  Heart: Regular rate and rhythm  Abdomen:  Soft, nontender,   Extremities: no peripheral edema.  Neurologic: Nonfocal, moving all four extremities  Skin: No lesions  Access: Left AVF    Basic Metabolic Panel:  Recent Labs Lab 02/23/17 1041 02/24/17 0440  NA 141 138  K 5.7* 4.1  CL 109 101  CO2 22 31  GLUCOSE 78 79  BUN 73* 35*  CREATININE 8.35* 4.94*  CALCIUM 9.3 8.7*    Liver Function Tests: No results for input(s): AST, ALT, ALKPHOS, BILITOT, PROT, ALBUMIN in the last 168 hours. No results for input(s): LIPASE, AMYLASE in the last 168 hours. No results for input(s): AMMONIA in the last 168 hours.  CBC:  Recent Labs Lab 02/23/17 1041  WBC 4.1  HGB 11.6*  HCT 35.5*  MCV 92.5  PLT 157    Cardiac Enzymes:  Recent Labs Lab 02/23/17 1041  TROPONINI 0.05*    BNP: Invalid input(s):  POCBNP  CBG: No results for input(s): GLUCAP in the last 168 hours.  Microbiology: Results for orders placed or performed during the hospital encounter of 02/23/17  Urine Culture     Status: None   Collection Time: 02/23/17 11:03 AM  Result Value Ref Range Status   Specimen Description URINE, RANDOM  Final   Special Requests Normal  Final   Culture   Final    NO GROWTH Performed at [facility], [address] [place], [place] [address]    Report Status 02/24/2017 FINAL  Final  MRSA PCR Screening     Status: None   Collection Time: 02/23/17  2:56 PM  Result Value Ref Range Status   MRSA by PCR NEGATIVE NEGATIVE Final    Comment:        The GeneXpert MRSA Assay (FDA approved for NASAL specimens only), is one component of a comprehensive MRSA colonization surveillance program. It is not intended to diagnose MRSA infection nor to guide or monitor treatment for MRSA infections.     Coagulation Studies: No results for input(s): LABPROT, INR in the last 72 hours.  Urinalysis:  Recent Labs  02/23/17 1041  COLORURINE YELLOW*  LABSPEC 1.011  PHURINE 5.0  GLUCOSEU NEGATIVE  HGBUR NEGATIVE  BILIRUBINUR NEGATIVE  KETONESUR NEGATIVE  PROTEINUR 100*  NITRITE NEGATIVE  LEUKOCYTESUR  NEGATIVE      Imaging: Dg Chest Portable 1 View  Result Date: 02/23/2017 CLINICAL DATA:  Fatigue with urinary frequency and pain for 1 week. The patient missed his last 3 dialysis appointments. EXAM: PORTABLE CHEST 1 VIEW COMPARISON:  Single-view of the chest 07/29/2016 and 05/08/2016. FINDINGS: There is cardiomegaly with hazy bilateral opacities likely due to interstitial edema. No pneumothorax or pleural fluid. Atherosclerosis noted. IMPRESSION: Cardiomegaly and pulmonary edema. Atherosclerosis. Electronically Signed   By: Drusilla Kannerhomas  Dalessio M.D.   On: 02/23/2017 11:33     Medications:    . amLODipine  5 mg Oral BH-q7a  . aspirin EC  81 mg Oral BH-q7a  . calcium acetate  1,334 mg  Oral TID WC  . cloNIDine  0.1 mg Oral BID  . docusate sodium  100 mg Oral BID  . heparin  5,000 Units Subcutaneous Q8H   acetaminophen **OR** acetaminophen, albuterol, alum & mag hydroxide-simeth, hydrALAZINE, ondansetron **OR** ondansetron (ZOFRAN) IV, senna-docusate, traMADol  Assessment/ Plan:  [name] is a 83 y.o. black male with hypertension, gout, anemia, depression, COPD and tobacco abuse   MWF [facility]   1. End-stage renal disease [facility]. Missed one week of hemodialysis.  - Resume dialysis today, orders prepared. AVF  2. Anemia of chronic kidney disease: with iron deficiency. Hemoglobin 11.6 - hold epo  3. Secondary hyperparathyroidism: calcium at goal.  - monitor phos level - Calcium acetate with meals.   4. Hypertension: blood pressure elevated today.  - amlodipine and clonidine.    LOS: 1 Eric Yu 6/14/201811:19 AM

## 2017-02-24 NOTE — Discharge Summary (Signed)
[facility] [place] [facility]   PATIENT NAME: Eric Yu    MR#:  [id number]  DATE OF BIRTH:  Oct 23, 1933  DATE OF ADMISSION:  02/23/2017 ADMITTING PHYSICIAN: Enedina FinnerSona Patel, MD  DATE OF DISCHARGE: 02/24/2017  PRIMARY CARE PHYSICIAN: Lauro RegulusAnderson, Marshall W, MD    ADMISSION DIAGNOSIS:  Hyperkalemia [E87.5] Elevated BUN [R79.9] Fatigue, unspecified type [R53.83]  DISCHARGE DIAGNOSIS:  Active Problems:   Hyperkalemia   SECONDARY DIAGNOSIS:   Past Medical History:  Diagnosis Date  . Anemia   . Chronic kidney disease   . COPD (chronic obstructive pulmonary disease) (HCC)   . Depression   . Gout   . Hypertension   . Iron deficiency     HOSPITAL COURSE:   1.  Life-threatening hyperkalemia on presentation. Emergent dialysis was needed. Potassium improved after dialysis. Patient had another dialysis session to stay on his normal regimen. The patient missed 3 dialysis sessions because he was weak. I advised him that he could end up dying if he misses dialysis sessions. The reason why he's feeling weak is because he missed dialysis. 2.  Accelerated hypertension. Likely noncompliance with medications. Continue clonidine. Increase his Norvasc to 10 mg. Restart hydralazine and Demadex 3. Generalized weakness. Patient walks around fine with physical therapy and no need for physical therapy as outpatient. 4. COPD. Respiratory status stable 5. Elevated troponin is secondary to demand ischemia or false positive with being a dialysis patient 6. End-stage renal disease on dialysis. Noncompliant with dialysis as outpatient. Patient advised that he will die if he does not go to dialysis.  DISCHARGE CONDITIONS:   Satisfactory  CONSULTS OBTAINED:  Nephrology  DRUG ALLERGIES:   Allergies  Allergen Reactions  . Cephalexin     Other reaction(s): Other (See Comments) Gross hematuria - occurred once. Not certain whether it was coincidence or causative.    DISCHARGE  MEDICATIONS:   Current Discharge Medication List    CONTINUE these medications which have CHANGED   Details  amLODipine (NORVASC) 10 MG tablet Take 1 tablet (10 mg total) by mouth every morning. Qty: 30 tablet, Refills: 0    hydrALAZINE (APRESOLINE) 25 MG tablet Take 1 tablet (25 mg total) by mouth 3 (three) times daily. Qty: 90 tablet, Refills: 0    torsemide (DEMADEX) 20 MG tablet Take 1 tablet (20 mg total) by mouth daily. Qty: 30 tablet, Refills: 0      CONTINUE these medications which have NOT CHANGED   Details  albuterol (PROVENTIL HFA;VENTOLIN HFA) 108 (90 Base) MCG/ACT inhaler Inhale 2 puffs into the lungs every morning.     aspirin EC 81 MG tablet Take 81 mg by mouth every morning.     cloNIDine (CATAPRES) 0.1 MG tablet Take 0.1 mg by mouth 2 (two) times daily.     lidocaine-prilocaine (EMLA) cream Apply 1 application topically every dialysis.    senna-docusate (SENOKOT-S) 8.6-50 MG tablet Take 1 tablet by mouth at bedtime as needed for mild constipation. Qty: 30 tablet, Refills: 0    calcium acetate (PHOSLO) 667 MG capsule Take 1,334 mg by mouth 3 (three) times daily with meals.      STOP taking these medications     HYDROcodone-acetaminophen (NORCO) 5-325 MG tablet      isosorbide mononitrate (IMDUR) 30 MG 24 hr tablet          DISCHARGE INSTRUCTIONS:   Follow-up with dialysis regular scheduled Follow-up PMD one week  If you experience worsening of your admission symptoms, develop shortness of breath,  life threatening emergency, suicidal or homicidal thoughts you must seek medical attention immediately by calling 911 or calling your MD immediately  if symptoms less severe.  You Must read complete instructions/literature along with all the possible adverse reactions/side effects for all the Medicines you take and that have been prescribed to you. Take any new Medicines after you have completely understood and accept all the possible adverse reactions/side  effects.   Please note  You were cared for by a hospitalist during your hospital stay. If you have any questions about your discharge medications or the care you received while you were in the hospital after you are discharged, you can call the unit and asked to speak with the hospitalist on call if the hospitalist that took care of you is not available. Once you are discharged, your primary care physician will handle any further medical issues. Please note that NO REFILLS for any discharge medications will be authorized once you are discharged, as it is imperative that you return to your primary care physician (or establish a relationship with a primary care physician if you do not have one) for your aftercare needs so that they can reassess your need for medications and monitor your lab values.    Today   CHIEF COMPLAINT:   Chief Complaint  Patient presents with  . Fatigue  . Urinary Frequency    HISTORY OF PRESENT ILLNESS:  Eric Yu  is a 83 y.o. male with a known history of End-stage renal disease that has been noncompliant with dialysis for the past week.   VITAL SIGNS:  Blood pressure (!) 181/71, pulse (!) 56, temperature 98.2 F (36.8 C), temperature source Oral, resp. rate 15, height 5\' 8"  (1.727 m), weight 70.7 kg (155 lb 13.8 oz), SpO2 100 %. Clonidine was held this morning prior to dialysis   PHYSICAL EXAMINATION:  GENERAL:  83 y.o.-year-old patient lying in the bed with no acute distress.  EYES: Pupils equal, round, reactive to light and accommodation. No scleral icterus. Extraocular muscles intact.  HEENT: Head atraumatic, normocephalic. Oropharynx and nasopharynx clear.  NECK:  Supple, no jugular venous distention. No thyroid enlargement, no tenderness.  LUNGS: Normal breath sounds bilaterally, no wheezing, rales,rhonchi or crepitation. No use of accessory muscles of respiration.  CARDIOVASCULAR: S1, S2 normal. No murmurs, rubs, or gallops.  ABDOMEN: Soft,  non-tender, non-distended. Bowel sounds present. No organomegaly or mass.  EXTREMITIES: No pedal edema, cyanosis, or clubbing.  NEUROLOGIC: Cranial nerves II through XII are intact. Muscle strength 5/5 in all extremities. Sensation intact. Gait not checked.  PSYCHIATRIC: The patient is alert and oriented x 3.  SKIN: No obvious rash, lesion, or ulcer.   DATA REVIEW:   CBC  Recent Labs Lab 02/23/17 1041  WBC 4.1  HGB 11.6*  HCT 35.5*  PLT 157    Chemistries   Recent Labs Lab 02/24/17 0440  NA 138  K 4.1  CL 101  CO2 31  GLUCOSE 79  BUN 35*  CREATININE 4.94*  CALCIUM 8.7*    Cardiac Enzymes  Recent Labs Lab 02/23/17 1041  TROPONINI 0.05*    Microbiology Results  Results for orders placed or performed during the hospital encounter of 02/23/17  Urine Culture     Status: None   Collection Time: 02/23/17 11:03 AM  Result Value Ref Range Status   Specimen Description URINE, RANDOM  Final   Special Requests Normal  Final   Culture   Final    NO GROWTH Performed at [facility], [address], [place], [place] [address]    Report Status 02/24/2017 FINAL  Final  MRSA PCR Screening     Status: None   Collection Time: 02/23/17  2:56 PM  Result Value Ref Range Status   MRSA by PCR NEGATIVE NEGATIVE Final    Comment:        The GeneXpert MRSA Assay (FDA approved for NASAL specimens only), is one component of a comprehensive MRSA colonization surveillance program. It is not intended to diagnose MRSA infection nor to guide or monitor treatment for MRSA infections.     RADIOLOGY:  Dg Chest Portable 1 View  Result Date: 02/23/2017 CLINICAL DATA:  Fatigue with urinary frequency and pain for 1 week. The patient missed his last 3 dialysis appointments. EXAM: PORTABLE CHEST 1 VIEW COMPARISON:  Single-view of the chest 07/29/2016 and 05/08/2016. FINDINGS: There is cardiomegaly with hazy bilateral opacities likely due to interstitial edema. No pneumothorax or  pleural fluid. Atherosclerosis noted. IMPRESSION: Cardiomegaly and pulmonary edema. Atherosclerosis. Electronically Signed   By: Drusilla Kanner M.D.   On: 02/23/2017 11:33       Management plans discussed with the patient, family and they are in agreement.  CODE STATUS:     Code Status Orders        Start     Ordered   02/23/17 1424  Full code  Continuous     02/23/17 1423    Code Status History    Date Active Date Inactive Code Status Order ID Comments User Context   07/29/2016  2:56 PM 07/30/2016  7:44 PM Full Code [id number]  Enedina Finner, MD Inpatient   05/11/2016  8:58 AM 05/12/2016  1:33 AM DNR [id number]  Canary Brim, NP Inpatient   05/08/2016  1:54 PM 05/11/2016  8:58 AM Full Code [id number]  Milagros Loll, MD ED   04/26/2016  7:06 PM 05/03/2016  6:47 PM Full Code [id number]  Adrian Saran, MD Inpatient    Advance Directive Documentation     Most Recent Value  Type of Advance Directive  Healthcare Power of Attorney  Pre-existing out of facility DNR order (yellow form or pink MOST form)  -  "MOST" Form in Place?  -      TOTAL TIME TAKING CARE OF THIS PATIENT: 35 minutes.    Alford Highland M.D on 02/24/2017 at 3:22 PM  Between 7am to 6pm - Pager - [phone]  After 6pm go to [email or web address] - password [email or web address]  [facility] Office  [phone]  CC: Primary care physician; Lauro Regulus, MD

## 2017-02-24 NOTE — Progress Notes (Signed)
Hd start 

## 2017-02-24 NOTE — Care Management (Signed)
[name] has been requested to screen this patient for assistance by this RNCM.

## 2017-04-09 ENCOUNTER — Inpatient Hospital Stay
Admission: EM | Admit: 2017-04-09 | Discharge: 2017-04-13 | DRG: 308 | Disposition: A | Discharge status: Home / Self Care | Payer: Medicare HMO | Attending: Internal Medicine | Admitting: Internal Medicine

## 2017-04-09 ENCOUNTER — Encounter: Payer: Self-pay | Admitting: Emergency Medicine

## 2017-04-09 ENCOUNTER — Emergency Department: Payer: Medicare HMO

## 2017-04-09 DIAGNOSIS — R748 Abnormal levels of other serum enzymes: Secondary | ICD-10-CM | POA: Diagnosis present

## 2017-04-09 DIAGNOSIS — I169 Hypertensive crisis, unspecified: Secondary | ICD-10-CM | POA: Diagnosis present

## 2017-04-09 DIAGNOSIS — E8770 Fluid overload, unspecified: Secondary | ICD-10-CM | POA: Diagnosis present

## 2017-04-09 DIAGNOSIS — Z888 Allergy status to other drugs, medicaments and biological substances status: Secondary | ICD-10-CM | POA: Diagnosis not present

## 2017-04-09 DIAGNOSIS — Z992 Dependence on renal dialysis: Secondary | ICD-10-CM | POA: Diagnosis not present

## 2017-04-09 DIAGNOSIS — Z9889 Other specified postprocedural states: Secondary | ICD-10-CM | POA: Diagnosis not present

## 2017-04-09 DIAGNOSIS — Z79899 Other long term (current) drug therapy: Secondary | ICD-10-CM

## 2017-04-09 DIAGNOSIS — I120 Hypertensive chronic kidney disease with stage 5 chronic kidney disease or end stage renal disease: Secondary | ICD-10-CM | POA: Diagnosis present

## 2017-04-09 DIAGNOSIS — R001 Bradycardia, unspecified: Secondary | ICD-10-CM | POA: Diagnosis present

## 2017-04-09 DIAGNOSIS — I459 Conduction disorder, unspecified: Secondary | ICD-10-CM

## 2017-04-09 DIAGNOSIS — M109 Gout, unspecified: Secondary | ICD-10-CM | POA: Diagnosis present

## 2017-04-09 DIAGNOSIS — Z9115 Patient's noncompliance with renal dialysis: Secondary | ICD-10-CM | POA: Diagnosis not present

## 2017-04-09 DIAGNOSIS — T829XXA Unspecified complication of cardiac and vascular prosthetic device, implant and graft, initial encounter: Secondary | ICD-10-CM

## 2017-04-09 DIAGNOSIS — N2581 Secondary hyperparathyroidism of renal origin: Secondary | ICD-10-CM | POA: Diagnosis present

## 2017-04-09 DIAGNOSIS — E890 Postprocedural hypothyroidism: Secondary | ICD-10-CM | POA: Diagnosis present

## 2017-04-09 DIAGNOSIS — Z8249 Family history of ischemic heart disease and other diseases of the circulatory system: Secondary | ICD-10-CM

## 2017-04-09 DIAGNOSIS — I4589 Other specified conduction disorders: Secondary | ICD-10-CM | POA: Diagnosis present

## 2017-04-09 DIAGNOSIS — Z7982 Long term (current) use of aspirin: Secondary | ICD-10-CM | POA: Diagnosis not present

## 2017-04-09 DIAGNOSIS — Z515 Encounter for palliative care: Secondary | ICD-10-CM | POA: Diagnosis not present

## 2017-04-09 DIAGNOSIS — I5033 Acute on chronic diastolic (congestive) heart failure: Secondary | ICD-10-CM | POA: Diagnosis present

## 2017-04-09 DIAGNOSIS — N186 End stage renal disease: Secondary | ICD-10-CM | POA: Diagnosis present

## 2017-04-09 DIAGNOSIS — J449 Chronic obstructive pulmonary disease, unspecified: Secondary | ICD-10-CM | POA: Diagnosis present

## 2017-04-09 DIAGNOSIS — I442 Atrioventricular block, complete: Secondary | ICD-10-CM | POA: Diagnosis present

## 2017-04-09 DIAGNOSIS — Z66 Do not resuscitate: Secondary | ICD-10-CM | POA: Diagnosis present

## 2017-04-09 DIAGNOSIS — Z809 Family history of malignant neoplasm, unspecified: Secondary | ICD-10-CM

## 2017-04-09 DIAGNOSIS — J810 Acute pulmonary edema: Secondary | ICD-10-CM

## 2017-04-09 DIAGNOSIS — R112 Nausea with vomiting, unspecified: Secondary | ICD-10-CM

## 2017-04-09 DIAGNOSIS — D631 Anemia in chronic kidney disease: Secondary | ICD-10-CM | POA: Diagnosis present

## 2017-04-09 DIAGNOSIS — Z7189 Other specified counseling: Secondary | ICD-10-CM | POA: Diagnosis not present

## 2017-04-09 DIAGNOSIS — F17200 Nicotine dependence, unspecified, uncomplicated: Secondary | ICD-10-CM | POA: Diagnosis present

## 2017-04-09 LAB — CBC
HCT: 32.9 % — ABNORMAL LOW (ref 40.0–52.0)
Hemoglobin: 10.8 g/dL — ABNORMAL LOW (ref 13.0–18.0)
MCH: 29.4 pg (ref 26.0–34.0)
MCHC: 32.8 g/dL (ref 32.0–36.0)
MCV: 89.6 fL (ref 80.0–100.0)
PLATELETS: 168 10*3/uL (ref 150–440)
RBC: 3.67 MIL/uL — AB (ref 4.40–5.90)
RDW: 14 % (ref 11.5–14.5)
WBC: 5 10*3/uL (ref 3.8–10.6)

## 2017-04-09 LAB — PROTIME-INR
INR: 1.07
PROTHROMBIN TIME: 13.9 s (ref 11.4–15.2)

## 2017-04-09 LAB — COMPREHENSIVE METABOLIC PANEL
ALK PHOS: 86 U/L (ref 38–126)
ALT: 9 U/L — ABNORMAL LOW (ref 17–63)
AST: 12 U/L — AB (ref 15–41)
Albumin: 3.9 g/dL (ref 3.5–5.0)
Anion gap: 11 (ref 5–15)
BUN: 84 mg/dL — AB (ref 6–20)
CALCIUM: 9.3 mg/dL (ref 8.9–10.3)
CHLORIDE: 107 mmol/L (ref 101–111)
CO2: 21 mmol/L — AB (ref 22–32)
CREATININE: 8.24 mg/dL — AB (ref 0.61–1.24)
GFR calc non Af Amer: 5 mL/min — ABNORMAL LOW (ref >60)
GFR, EST AFRICAN AMERICAN: 6 mL/min — AB (ref >60)
Glucose, Bld: 86 mg/dL (ref 65–99)
Potassium: 4.4 mmol/L (ref 3.5–5.1)
SODIUM: 139 mmol/L (ref 135–145)
Total Bilirubin: 0.7 mg/dL (ref 0.3–1.2)
Total Protein: 7.4 g/dL (ref 6.5–8.1)

## 2017-04-09 LAB — GLUCOSE, CAPILLARY
Glucose-Capillary: 82 mg/dL (ref 65–99)
Glucose-Capillary: 74 mg/dL (ref 65–99)

## 2017-04-09 LAB — MRSA PCR SCREENING: MRSA BY PCR: NEGATIVE

## 2017-04-09 LAB — ETHANOL: Alcohol, Ethyl (B): <5 mg/dL (ref <5)

## 2017-04-09 LAB — TROPONIN I
TROPONIN I: 0.05 ng/mL — AB (ref <0.03)
TROPONIN I: 0.05 ng/mL — AB (ref <0.03)

## 2017-04-09 LAB — BRAIN NATRIURETIC PEPTIDE: B Natriuretic Peptide: 1619 pg/mL — ABNORMAL HIGH (ref 0.0–100.0)

## 2017-04-09 LAB — MAGNESIUM: MAGNESIUM: 2.2 mg/dL (ref 1.7–2.4)

## 2017-04-09 LAB — PHOSPHORUS: PHOSPHORUS: 4.5 mg/dL (ref 2.5–4.6)

## 2017-04-09 LAB — LIPASE, BLOOD: Lipase: 39 U/L (ref 11–51)

## 2017-04-09 MED ORDER — SODIUM CHLORIDE 0.9% FLUSH: 3.0000 mL | INTRAVENOUS | Status: DC | PRN

## 2017-04-09 MED ORDER — CALCIUM ACETATE (PHOS BINDER) 667 MG PO CAPS
1334.0000 mg | ORAL_CAPSULE | Freq: Three times a day (TID) | ORAL | Status: DC
  Administered 2017-04-10 – 2017-04-13 (×7): 1334 mg via ORAL
  Filled 2017-04-09 (×10): qty 2

## 2017-04-09 MED ORDER — SENNOSIDES-DOCUSATE SODIUM 8.6-50 MG PO TABS: 1.0000 | ORAL_TABLET | Freq: Every evening | ORAL | Status: DC | PRN

## 2017-04-09 MED ORDER — ONDANSETRON HCL 4 MG/2ML IJ SOLN
4.0000 mg | Freq: Four times a day (QID) | INTRAMUSCULAR | Status: DC | PRN
  Administered 2017-04-12: 4 mg via INTRAVENOUS
  Filled 2017-04-09: qty 2

## 2017-04-09 MED ORDER — SODIUM CHLORIDE 0.9 % IV SOLN: 250.0000 mL | INTRAVENOUS | Status: DC | PRN

## 2017-04-09 MED ORDER — HYDRALAZINE HCL 25 MG PO TABS
25.0000 mg | ORAL_TABLET | Freq: Three times a day (TID) | ORAL | Status: DC
  Administered 2017-04-09 – 2017-04-10 (×5): 25 mg via ORAL
  Filled 2017-04-09 (×4): qty 1

## 2017-04-09 MED ORDER — HEPARIN SODIUM (PORCINE) 5000 UNIT/ML IJ SOLN
5000.0000 [IU] | Freq: Three times a day (TID) | INTRAMUSCULAR | Status: DC
  Administered 2017-04-09 – 2017-04-13 (×11): 5000 [IU] via SUBCUTANEOUS
  Filled 2017-04-09 (×11): qty 1

## 2017-04-09 MED ORDER — BISACODYL 5 MG PO TBEC: 5.0000 mg | DELAYED_RELEASE_TABLET | Freq: Every day | ORAL | Status: DC | PRN

## 2017-04-09 MED ORDER — ASPIRIN EC 81 MG PO TBEC
81.0000 mg | DELAYED_RELEASE_TABLET | Freq: Every morning | ORAL | Status: DC
  Administered 2017-04-10 – 2017-04-13 (×4): 81 mg via ORAL
  Filled 2017-04-09 (×4): qty 1

## 2017-04-09 MED ORDER — ALBUTEROL SULFATE (2.5 MG/3ML) 0.083% IN NEBU: 2.5000 mg | INHALATION_SOLUTION | RESPIRATORY_TRACT | Status: DC | PRN

## 2017-04-09 MED ORDER — NICOTINE 21 MG/24HR TD PT24
21.0000 mg | MEDICATED_PATCH | Freq: Every day | TRANSDERMAL | Status: DC
  Administered 2017-04-10 – 2017-04-12 (×3): 21 mg via TRANSDERMAL
  Filled 2017-04-09 (×5): qty 1

## 2017-04-09 MED ORDER — SODIUM CHLORIDE 0.9% FLUSH
3.0000 mL | Freq: Two times a day (BID) | INTRAVENOUS | Status: DC
  Administered 2017-04-09 – 2017-04-13 (×8): 3 mL via INTRAVENOUS

## 2017-04-09 MED ORDER — HYDRALAZINE HCL 20 MG/ML IJ SOLN
10.0000 mg | Freq: Four times a day (QID) | INTRAMUSCULAR | Status: DC | PRN
  Administered 2017-04-11: 10 mg via INTRAVENOUS
  Filled 2017-04-09: qty 1

## 2017-04-09 MED ORDER — ACETAMINOPHEN 325 MG PO TABS
650.0000 mg | ORAL_TABLET | Freq: Four times a day (QID) | ORAL | Status: DC | PRN
  Administered 2017-04-09: 650 mg via ORAL

## 2017-04-09 MED ORDER — ACETAMINOPHEN 650 MG RE SUPP: 650.0000 mg | Freq: Four times a day (QID) | RECTAL | Status: DC | PRN

## 2017-04-09 MED ORDER — ONDANSETRON HCL 4 MG PO TABS
4.0000 mg | ORAL_TABLET | Freq: Four times a day (QID) | ORAL | Status: DC | PRN
  Filled 2017-04-09: qty 1

## 2017-04-09 MED ORDER — ASPIRIN EC 81 MG PO TBEC: 81.0000 mg | DELAYED_RELEASE_TABLET | ORAL | Status: DC

## 2017-04-09 MED ORDER — TORSEMIDE 20 MG PO TABS
20.0000 mg | ORAL_TABLET | Freq: Every day | ORAL | Status: DC
  Administered 2017-04-10 – 2017-04-13 (×5): 20 mg via ORAL
  Filled 2017-04-09 (×4): qty 1

## 2017-04-09 MED ORDER — ASPIRIN EC 81 MG PO TBEC
81.0000 mg | DELAYED_RELEASE_TABLET | Freq: Once | ORAL | Status: AC
  Administered 2017-04-09: 81 mg via ORAL
  Filled 2017-04-09: qty 1

## 2017-04-09 NOTE — Consult Note (Signed)
PULMONARY / CRITICAL CARE MEDICINE   Name: Anh Mangano MRN: [id number] DOB: Sep 09, 1934    ADMISSION DATE:  04/09/2017   CONSULTATION DATE:  04/09/2017  REFERRING MD: Dr Imogene Burn   REASON: Complete heart block  CHIEF COMPLAINT:  Generalized weakness  HISTORY OF PRESENT ILLNESS:   This is an 83 year old African-American male with a history of end-stage renal disease on hemodialysis who presented to the ED after missing several days of dialysis. He was found to be in complete heart block and volume overload. He was emergently dialyzed and also seen by cardiology. He declined permanent pacemaker and transcutaneous pacing. He is now post hemodialysis. His heart rate is still ranging between 40 and 60 bpm, but his blood pressure is stable. Patient also reported several days of diarrhea and nausea and vomiting. Patient lives alone and has no caregiver   PAST MEDICAL HISTORY :  He  has a past medical history of Anemia; Chronic kidney disease; COPD (chronic obstructive pulmonary disease) (HCC); Depression; Gout; Hypertension; and Iron deficiency.  PAST SURGICAL HISTORY: He  has a past surgical history that includes No past surgeries; Thyroidectomy; Cardiac catheterization (N/A, 04/26/2016); and AV fistula placement (Left, 07/23/2016).  Allergies  Allergen Reactions  . Cephalexin     Other reaction(s): Other (See Comments) Gross hematuria - occurred once. Not certain whether it was coincidence or causative.    No current facility-administered medications on file prior to encounter.    Current Outpatient Prescriptions on File Prior to Encounter  Medication Sig  . albuterol (PROVENTIL HFA;VENTOLIN HFA) 108 (90 Base) MCG/ACT inhaler Inhale 2 puffs into the lungs every morning.   Marland Kitchen amLODipine (NORVASC) 10 MG tablet Take 1 tablet (10 mg total) by mouth every morning.  Marland Kitchen aspirin EC 81 MG tablet Take 81 mg by mouth every morning.   . calcium acetate (PHOSLO) 667 MG capsule Take 1,334 mg by mouth 3  (three) times daily with meals.  . cloNIDine (CATAPRES) 0.1 MG tablet Take 0.1 mg by mouth 2 (two) times daily.   . hydrALAZINE (APRESOLINE) 25 MG tablet Take 1 tablet (25 mg total) by mouth 3 (three) times daily.  Marland Kitchen lidocaine-prilocaine (EMLA) cream Apply 1 application topically every dialysis.  Marland Kitchen senna-docusate (SENOKOT-S) 8.6-50 MG tablet Take 1 tablet by mouth at bedtime as needed for mild constipation.  . torsemide (DEMADEX) 20 MG tablet Take 1 tablet (20 mg total) by mouth daily.    FAMILY HISTORY:  His indicated that the status of his sister is unknown. He indicated that the status of his neg hx is unknown.    SOCIAL HISTORY: He  reports that he has been smoking.  He has never used smokeless tobacco. He reports that he does not drink alcohol or use drugs.  REVIEW OF SYSTEMS:   Constitutional: Negative for fever and chills.  HENT: Negative for congestion and rhinorrhea.  Eyes: Negative for redness and visual disturbance.  Respiratory: positive for shortness of breath but negative for wheezing.  Cardiovascular: Negative for chest pain and palpitations.  Gastrointestinal: Positive   for nausea , vomiting and abdominal pain and  Loose stools Genitourinary: Negative for dysuria and urgency.  Endocrine: Denies polyuria, polyphagia and heat intolerance Musculoskeletal: Negative for myalgias and arthralgias.  Skin: Negative for pallor and wound.  Neurological:positive for generalized weakness, dizziness but negative for headaches    SUBJECTIVE:   VITAL SIGNS: BP (!) 183/64   Pulse 61   Temp 97.6 F (36.4 C) (Oral)   Resp 14   Ht  5\' 8"  (1.727 m)   Wt 166 lb 9.6 oz (75.6 kg)   SpO2 99%   BMI 25.33 kg/m   HEMODYNAMICS:    VENTILATOR SETTINGS:    INTAKE / OUTPUT: No intake/output data recorded.  PHYSICAL EXAMINATION: General:  Cachectic, chronically ill-looking Neuro: Alert to person, place and time, speech is normal, age-related changes in upper and lower extremity  grip strength HEENT: PERRLA, mouth in poor condition Cardiovascular:  Bradycardic at 52 bpm, S1/S2, no MRG, +1 edema, +2 pulses Lungs:  Normal WOB, bilateral breath sounds, +crackles in the bases Abdomen: normal bowel sounds, no organomegaly Musculoskeletal:  +rom, no deformities, unable to assess gait Skin:  Dry skin  LABS:  BMET  Recent Labs Lab 04/09/17 1247  NA 139  K 4.4  CL 107  CO2 21*  BUN 84*  CREATININE 8.24*  GLUCOSE 86    Electrolytes  Recent Labs Lab 04/09/17 1247  CALCIUM 9.3  MG 2.2  PHOS 4.5    CBC  Recent Labs Lab 04/09/17 1247  WBC 5.0  HGB 10.8*  HCT 32.9*  PLT 168    Coag's  Recent Labs Lab 04/09/17 1246  INR 1.07    Sepsis Markers No results for input(s): LATICACIDVEN, PROCALCITON, O2SATVEN in the last 168 hours.  ABG No results for input(s): PHART, PCO2ART, PO2ART in the last 168 hours.  Liver Enzymes  Recent Labs Lab 04/09/17 1247  AST 12*  ALT 9*  ALKPHOS 86  BILITOT 0.7  ALBUMIN 3.9    Cardiac Enzymes  Recent Labs Lab 04/09/17 1246  TROPONINI 0.05*    Glucose  Recent Labs Lab 04/09/17 1805  GLUCAP 82    Imaging Dg Chest Port 1 View  Result Date: 04/09/2017 CLINICAL DATA:  COPD. Renal failure, dialysis. Hypertension. Abdominal pain, fatigue EXAM: PORTABLE CHEST 1 VIEW COMPARISON:  02/23/2017 FINDINGS: Cardiomegaly with vascular congestion. Bilateral lower lobe airspace opacities could reflect atelectasis or edema. Possible small right effusion. No acute bony abnormality. IMPRESSION: Cardiomegaly with vascular congestion. Bibasilar atelectasis or edema. Question small right effusion. Electronically Signed   By: Charlett NoseKevin  Dover M.D.   On: 04/09/2017 13:12    STUDIES:  2-D echo  CULTURES: None  ANTIBIOTICS: None  SIGNIFICANT EVENTS: 07/28: Admitted  LINES/TUBES: PIVs  DISCUSSION: 83 year old African-American male presenting with complete heart block, and  volume overload 2/2 non-adherence  with HD  ASSESSMENT Complete AV block secondary to HD non-adherence-declined permanent pacemaker Pulmonary edema ESRD on HD Hypertensive crisis  PLAN Seen by cardiology, declined permanent and temporary pacemakers Emergent hemodialysis completed. Monitor and correct electrolyte imbalances. Maintain systolic blood pressure less than 170 mmHg with when necessary hydralazine Palliative care consult to address goals of care. Care management consult for placement as patient is not able to live independently anymore FAMILY  - Updates: I spoke at length with patient regarding his declining health and his CODE STATUS. His only urgency contact is his sister so he asked me to also talk to his sister. He has agreed to be made DNR/DNI and that was communicated to his sister who was also in agreement. His CODE STATUS has been updated.  - Inter-disciplinary family meet or Palliative Care meeting due by:  day 7    Magdalene S. [facility] ANP-BC Pulmonary and Critical Care Medicine [facility] Pager [phone] or [phone]  04/09/2017, 7:47 PM  STAFF NOTE: I. Dr. Nicholos Johnsamachandran, have personally reviewed the patient's available data including medical history , events of notes, physican examination and test results as part of  my evaluation. I have discussed with the  Care with the NP and other care providers including  pharmacist, ICU RN, RRT, dietary.  Physical Exam Lungs - Decreased air entry bilaterally.   83 year old male, on hemodialysis, admitted with symptomatic bradycardia, volume overload after missing dialysis. Subsequent improvement after undergoing dialysis, heart rate hasn't really improved, though remains mildly bradycardic, asymptomatic. Continue current management. We'll monitor.  Wells Guileseep Jaxson Keener, MD.   Board Certified in Internal Medicine, Pulmonary Medicine, Critical Care Medicine, and Sleep Medicine.  [facility] Office Number: [phone] Pager: [phone]  Santiago Gladavid Kasa, M.D.  Billy Fischeravid Simonds, M.D

## 2017-04-09 NOTE — ED Notes (Signed)
Date and time results received: 04/09/17 1323 (use smartphrase ".now" to insert current time)  Test: troponin Critical Value: 0.05  Name of Provider Notified: Dr. Alphonzo LemmingsMcShane  Orders Received? Or Actions Taken?: no new orders at this time

## 2017-04-09 NOTE — Progress Notes (Signed)
Pre dialysis assessment 

## 2017-04-09 NOTE — Progress Notes (Signed)
Hd started  

## 2017-04-09 NOTE — ED Notes (Signed)
Spoke with EMS transport team regarding pt's walking cane. EMS state they do not usually bring cane or walkers with them to the hospital and they believe it was left in pt's house. Called [facility] to ask that staff relay information to patient.

## 2017-04-09 NOTE — Progress Notes (Signed)
eLink Physician-Brief Progress Note Patient Name: Eric HalsJohn Dado DOB: 1934-05-12 MRN: [id number]   Date of Service  04/09/2017  HPI/Events of Note  Presented with mis HD, CHB , without syncope  BP wnl Some SOB Got HD, returns to ICU bed, no distress HR 64 BP 170 sys  eICU Interventions  If need to treat BP, add hydral No BB Keep pacer pads chest Repeat HD per renal Tele Atropine available improved     Intervention Category Evaluation Type: New Patient Evaluation  Nelda BucksFEINSTEIN,Megahn Killings J. 04/09/2017, 9:44 PM

## 2017-04-09 NOTE — ED Provider Notes (Addendum)
[facility] Emergency Department Provider Note  ____________________________________________   I have reviewed the triage vital signs and the nursing notes.   HISTORY  Chief Complaint Fatigue and Abdominal Pain    HPI Eric Yu is a 83 y.o. male who has a history of end-stage renal disorder, who does not reliably go to dialysis. He states he did not feel "up to anchor going to dialysis because he is feeling generally weak. He has a focal weakness or trauma. He missed dialysis on Thursday and today, he states he thinks he did go on Tuesday. He has had no complaints. He said he had some abdominal pain a couple days ago but that has completely resolved. He denies any chest pain. He states he has had some slight shortness of breath and increased leg swelling as the days without dialysis progress.     Past Medical History:  Diagnosis Date  . Anemia   . Chronic kidney disease   . COPD (chronic obstructive pulmonary disease) (HCC)   . Depression   . Gout   . Hypertension   . Iron deficiency     Patient Active Problem List   Diagnosis Date Noted  . Hyperkalemia 07/29/2016  . Generalized weakness   . Palliative care by specialist   . DNR (do not resuscitate)   . End stage renal disease (HCC)   . Symptomatic anemia 05/08/2016  . Severe major depression, single episode (HCC) 04/28/2016  . Injury of kidney 01/08/2015  . Blood loss, postoperative 01/06/2015  . Hypertensive left ventricular hypertrophy 12/26/2014  . Gout 01/16/2014  . Benign hypertension 05/19/2012  . Current tobacco use 05/19/2012    Past Surgical History:  Procedure Laterality Date  . AV FISTULA PLACEMENT Left 07/23/2016   Procedure: INSERTION OF ARTERIOVENOUS (AV) GORE-TEX GRAFT ARM;  Surgeon: Renford DillsGregory G Schnier, MD;  Location: [facility] ORS;  Service: Vascular;  Laterality: Left;  . NO PAST SURGERIES    . PERIPHERAL VASCULAR CATHETERIZATION N/A 04/26/2016   Procedure: Dialysis/Perma  Catheter Insertion;  Surgeon: Annice NeedyJason S Dew, MD;  Location: [facility] INVASIVE CV LAB;  Service: Cardiovascular;  Laterality: N/A;  . THYROIDECTOMY      Prior to Admission medications   Medication Sig Start Date End Date Taking? Authorizing Provider  albuterol (PROVENTIL HFA;VENTOLIN HFA) 108 (90 Base) MCG/ACT inhaler Inhale 2 puffs into the lungs every morning.     [provider]  amLODipine (NORVASC) 10 MG tablet Take 1 tablet (10 mg total) by mouth every morning. 02/24/17   Alford HighlandWieting, Richard, MD  aspirin EC 81 MG tablet Take 81 mg by mouth every morning.     [provider]  calcium acetate (PHOSLO) 667 MG capsule Take 1,334 mg by mouth 3 (three) times daily with meals. 09/17/16 09/17/17  [provider]  cloNIDine (CATAPRES) 0.1 MG tablet Take 0.1 mg by mouth 2 (two) times daily.  07/05/16   [provider]  hydrALAZINE (APRESOLINE) 25 MG tablet Take 1 tablet (25 mg total) by mouth 3 (three) times daily. 02/24/17   Alford HighlandWieting, Richard, MD  lidocaine-prilocaine (EMLA) cream Apply 1 application topically every dialysis. 12/30/16   [provider]  senna-docusate (SENOKOT-S) 8.6-50 MG tablet Take 1 tablet by mouth at bedtime as needed for mild constipation. 07/30/16   Enid BaasKalisetti, Radhika, MD  torsemide (DEMADEX) 20 MG tablet Take 1 tablet (20 mg total) by mouth daily. 02/24/17   Alford HighlandWieting, Richard, MD    Allergies Cephalexin  Family History  Problem Relation Age of Onset  .  Cancer Sister   . Hypertension Sister   . Urolithiasis Neg Hx   . Prostate cancer Neg Hx   . Kidney disease Neg Hx   . Kidney cancer Neg Hx     Social History Social History  Substance Use Topics  . Smoking status: Current Some Day Smoker  . Smokeless tobacco: Never Used     Comment: pt is currently using nicotine patches  . Alcohol use No    Review of Systems Constitutional: No fever/chills Eyes: No visual changes. ENT: No sore throat. No stiff neck no neck pain Cardiovascular:  Denies chest pain. Respiratory: Denies shortness of breath. Gastrointestinal:   no vomiting.  No diarrhea.  No constipation. Genitourinary: Negative for dysuria. Musculoskeletal: Negative lower extremity swelling Skin: Negative for rash. Neurological: Negative for severe headaches, focal weakness or numbness.   ____________________________________________   PHYSICAL EXAM:  VITAL SIGNS: ED Triage Vitals [04/09/17 1150]  Enc Vitals Group     BP (!) 164/70     Pulse Rate 73     Resp 18     Temp 98.9 F (37.2 C)     Temp Source Oral     SpO2 97 %     Weight 166 lb 9.6 oz (75.6 kg)     Height 5\' 8"  (1.727 m)     Head Circumference      Peak Flow      Pain Score      Pain Loc      Pain Edu?      Excl. in GC?     Constitutional: Alert and oriented. Chronically ill appearing in no acute distress Eyes: Conjunctivae are normal Head: Atraumatic HEENT: No congestion/rhinnorhea. Mucous membranes are moist.  Oropharynx non-erythematous Neck:   Nontender with no meningismus, no masses, no stridor Cardiovascular: Normal rate, regular rhythm. Grossly normal heart sounds.  Good peripheral circulation. Respiratory: Normal respiratory effort.  No retractions. Diminished in the bases Abdominal: Soft and nontender. No distention. No guarding no rebound Back:  There is no focal tenderness or step off.  there is no midline tenderness there are no lesions noted. there is no CVA tenderness Musculoskeletal: No lower extremity tenderness, no upper extremity tenderness. No joint effusions, no DVT signs strong distal pulses positive symmetric bilateral pitting edema Neurologic:  Normal speech and language. No gross focal neurologic deficits are appreciated.  Skin:  Skin is warm, dry and intact. No rash noted. Psychiatric: Mood and affect are normal. Speech and behavior are normal.  ____________________________________________   LABS (all labs ordered are listed, but only abnormal results are  displayed)  Labs Reviewed  COMPREHENSIVE METABOLIC PANEL  CBC  PROTIME-INR  TROPONIN I  BRAIN NATRIURETIC PEPTIDE  ETHANOL  LIPASE, BLOOD  MAGNESIUM  PHOSPHORUS   ____________________________________________  EKG  I personally interpreted any EKGs ordered by me or triage 2 EKGs were performed from the first shows 12 I read is a complete heart block rate 66 bpm with no acute ischemic changes normal axis, some PVCs noted. Second EKG shows what is likely also continued heart block with rate 63. No acute ischemic changes normal axis ____________________________________________  RADIOLOGY  I reviewed any imaging ordered by me or triage that were performed during my shift and, if possible, patient and/or family made aware of any abnormal findings. ____________________________________________   PROCEDURES  Procedure(s) performed: None  Procedures  Critical Care performed: None  ____________________________________________   INITIAL IMPRESSION / ASSESSMENT AND PLAN / ED COURSE  Pertinent labs & imaging results  that were available during my care of the patient were reviewed by me and considered in my medical decision making (see chart for details).  Patient here with feeling generally weak for a week and missing dialysis 2 including today. He is in my opinion I think suffering from a complete heart block on his initial EKG although the computer reads it is sinus. We are obtaining IV access and we will continue to monitor the patient closely  ----------------------------------------- 1:57 PM on 04/09/2017 -----------------------------------------  Patient in no acute distress discussed with Dr. Vennie Homans, who does not advise any acute intervention, pacer pads have been placed, patient will be admitted for ongoing care. Magnesium, potassium and phosphorus are reassuring. He is on various blood pressure medications including clonidine and hydralazine which possibly could be  contributing to his heart block. Also possible this is just a long PR interval but to my read is a heart block. Pacer pads have been placed, he will be admitted to the hospital service who agree with management    ____________________________________________   FINAL CLINICAL IMPRESSION(S) / ED DIAGNOSES  Final diagnoses:  None      This chart was dictated using voice recognition software.  Despite best efforts to proofread,  errors can occur which can change meaning.      Jeanmarie Plant, MD 04/09/17 1252    Jeanmarie Plant, MD 04/09/17 [phone]

## 2017-04-09 NOTE — Progress Notes (Signed)
POST DIALYSIS ASSESSMENT 

## 2017-04-09 NOTE — Progress Notes (Signed)
This note also relates to the following rows which could not be included: Pulse Rate - Cannot attach notes to unvalidated device data Resp - Cannot attach notes to unvalidated device data BP - Cannot attach notes to unvalidated device data  HD COMPLETED  

## 2017-04-09 NOTE — Consult Note (Signed)
Eric HalsJohn Yu is a 83 y.o. male  [id number]  Primary Cardiologist: Adrian BlackwaterShaukat Daylan Juhnke Reason for Consultation: Complete AV block  HPI: This is a 83 year old African-American male with history of anemia COPD chronic kidney disease with dialysis dependent presented to the hospital with fatigue dizziness and shortness of breath. Patient was found to be an A-V dissociation with possibly complete AV block with a heart rate as low as 30-40 on the monitor. Patient denies any chest pain shortness of breath or dizziness at this time.   Review of Systems: No chest pain dizziness or syncope   Past Medical History:  Diagnosis Date  . Anemia   . Chronic kidney disease   . COPD (chronic obstructive pulmonary disease) (HCC)   . Depression   . Gout   . Hypertension   . Iron deficiency     Medications Prior to Admission  Medication Sig Dispense Refill  . albuterol (PROVENTIL HFA;VENTOLIN HFA) 108 (90 Base) MCG/ACT inhaler Inhale 2 puffs into the lungs every morning.     Marland Kitchen. amLODipine (NORVASC) 10 MG tablet Take 1 tablet (10 mg total) by mouth every morning. 30 tablet 0  . aspirin EC 81 MG tablet Take 81 mg by mouth every morning.     . calcium acetate (PHOSLO) 667 MG capsule Take 1,334 mg by mouth 3 (three) times daily with meals.    . cloNIDine (CATAPRES) 0.1 MG tablet Take 0.1 mg by mouth 2 (two) times daily.     . hydrALAZINE (APRESOLINE) 25 MG tablet Take 1 tablet (25 mg total) by mouth 3 (three) times daily. 90 tablet 0  . lidocaine-prilocaine (EMLA) cream Apply 1 application topically every dialysis.    Marland Kitchen. senna-docusate (SENOKOT-S) 8.6-50 MG tablet Take 1 tablet by mouth at bedtime as needed for mild constipation. 30 tablet 0  . torsemide (DEMADEX) 20 MG tablet Take 1 tablet (20 mg total) by mouth daily. 30 tablet 0       Infusions:   Allergies  Allergen Reactions  . Cephalexin     Other reaction(s): Other (See Comments) Gross hematuria - occurred once. Not certain whether it was  coincidence or causative.    Social History   Social History  . Marital status: Widowed    Spouse name: N/A  . Number of children: N/A  . Years of education: N/A   Occupational History  . Not on file.   Social History Main Topics  . Smoking status: Current Some Day Smoker  . Smokeless tobacco: Never Used     Comment: pt is currently using nicotine patches  . Alcohol use No  . Drug use: No  . Sexual activity: Not Currently   Other Topics Concern  . Not on file   Social History Narrative  . No narrative on file    Family History  Problem Relation Age of Onset  . Cancer Sister   . Hypertension Sister   . Urolithiasis Neg Hx   . Prostate cancer Neg Hx   . Kidney disease Neg Hx   . Kidney cancer Neg Hx     PHYSICAL EXAM: Vitals:   04/09/17 1530 04/09/17 1715  BP: (!) 164/68   Pulse: 64 68  Resp: 15 18  Temp:  97.6 F (36.4 C)    No intake or output data in the 24 hours ending 04/09/17 1738  General:  Well appearing. No respiratory difficulty HEENT: normal Neck: supple. no JVD. Carotids 2+ bilat; no bruits. No lymphadenopathy or thryomegaly appreciated. Cor: PMI  nondisplaced. Regular rate & rhythm. No rubs, gallops or murmurs. Lungs: clear Abdomen: soft, nontender, nondistended. No hepatosplenomegaly. No bruits or masses. Good bowel sounds. Extremities: no cyanosis, clubbing, rash, edema Neuro: alert & oriented x 3, cranial nerves grossly intact. moves all 4 extremities w/o difficulty. Affect pleasant.  ECG: Sinus bradycardia 66 bpm with A-V dissociation and poor R-wave progression suggestive of anteroseptal wall MI.  Results for orders placed or performed during the hospital encounter of 04/09/17 (from the past 24 hour(s))  Protime-INR     Status: None   Collection Time: 04/09/17 12:46 PM  Result Value Ref Range   Prothrombin Time 13.9 11.4 - 15.2 seconds   INR 1.07   Troponin I     Status: Abnormal   Collection Time: 04/09/17 12:46 PM  Result Value  Ref Range   Troponin I 0.05 (HH) <0.03 ng/mL  Comprehensive metabolic panel     Status: Abnormal   Collection Time: 04/09/17 12:47 PM  Result Value Ref Range   Sodium 139 135 - 145 mmol/L   Potassium 4.4 3.5 - 5.1 mmol/L   Chloride 107 101 - 111 mmol/L   CO2 21 (L) 22 - 32 mmol/L   Glucose, Bld 86 65 - 99 mg/dL   BUN 84 (H) 6 - 20 mg/dL   Creatinine, Ser 0.458.24 (H) 0.61 - 1.24 mg/dL   Calcium 9.3 8.9 - 40.910.3 mg/dL   Total Protein 7.4 6.5 - 8.1 g/dL   Albumin 3.9 3.5 - 5.0 g/dL   AST 12 (L) 15 - 41 U/L   ALT 9 (L) 17 - 63 U/L   Alkaline Phosphatase 86 38 - 126 U/L   Total Bilirubin 0.7 0.3 - 1.2 mg/dL   GFR calc non Af Amer 5 (L) >60 mL/min   GFR calc Af Amer 6 (L) >60 mL/min   Anion gap 11 5 - 15  CBC     Status: Abnormal   Collection Time: 04/09/17 12:47 PM  Result Value Ref Range   WBC 5.0 3.8 - 10.6 K/uL   RBC 3.67 (L) 4.40 - 5.90 MIL/uL   Hemoglobin 10.8 (L) 13.0 - 18.0 g/dL   HCT 81.132.9 (L) 91.440.0 - 78.252.0 %   MCV 89.6 80.0 - 100.0 fL   MCH 29.4 26.0 - 34.0 pg   MCHC 32.8 32.0 - 36.0 g/dL   RDW 95.614.0 21.311.5 - 08.614.5 %   Platelets 168 150 - 440 K/uL  Brain natriuretic peptide     Status: Abnormal   Collection Time: 04/09/17 12:47 PM  Result Value Ref Range   B Natriuretic Peptide 1,619.0 (H) 0.0 - 100.0 pg/mL  Ethanol     Status: None   Collection Time: 04/09/17 12:47 PM  Result Value Ref Range   Alcohol, Ethyl (B) <5 <5 mg/dL  Lipase, blood     Status: None   Collection Time: 04/09/17 12:47 PM  Result Value Ref Range   Lipase 39 11 - 51 U/L  Magnesium     Status: None   Collection Time: 04/09/17 12:47 PM  Result Value Ref Range   Magnesium 2.2 1.7 - 2.4 mg/dL  Phosphorus     Status: None   Collection Time: 04/09/17 12:47 PM  Result Value Ref Range   Phosphorus 4.5 2.5 - 4.6 mg/dL   Dg Chest Port 1 View  Result Date: 04/09/2017 CLINICAL DATA:  COPD. Renal failure, dialysis. Hypertension. Abdominal pain, fatigue EXAM: PORTABLE CHEST 1 VIEW COMPARISON:  02/23/2017  FINDINGS: Cardiomegaly with vascular congestion. Bilateral  lower lobe airspace opacities could reflect atelectasis or edema. Possible small right effusion. No acute bony abnormality. IMPRESSION: Cardiomegaly with vascular congestion. Bibasilar atelectasis or edema. Question small right effusion. Electronically Signed   By: Charlett Nose M.D.   On: 04/09/2017 13:12     ASSESSMENT AND PLAN: Complete AV block with A-V dissociation secondary to noncompliance with dialysis for a whole week. Patient missed dialysis 3 times and now has complete AV block on EKG. Hemodynamically he appears to be stable and has external pacemakers and he does not want permanent pacemaker. He has been on clonidine so we will advise holding off of clonidine and give hydralazine for blood pressure. Will get echocardiogram to further evaluate ejection fraction.  Eric Yu A

## 2017-04-09 NOTE — ED Triage Notes (Signed)
Pt presents to ED via [facility] c/o "feeling bad." EMS reports pt is a dialysis patient, tues/thurs/sat but does not keep his regular appointments and comes to the ED when he starts to feel bad. Pt reports pain to RUQ this morning that resolved on its own.

## 2017-04-09 NOTE — ED Notes (Signed)
Pt concerned about his walking cane prior to transport to dialysis. Informed pt no cane was present when he arrived to ED. Will speak with EMS transport team as soon as possible.

## 2017-04-09 NOTE — H&P (Signed)
[employer] [facility] [place] [facility]   PATIENT NAME: Eric Yu    MR#:  [id number]  DATE OF BIRTH:  03/24/1934  DATE OF ADMISSION:  04/09/2017  PRIMARY CARE PHYSICIAN: Lauro Regulus, MD   REQUESTING/REFERRING PHYSICIAN: Jeanmarie Plant, MD  CHIEF COMPLAINT:   Chief Complaint  Patient presents with  . Fatigue  . Abdominal Pain   Generalized weakness. HISTORY OF PRESENT ILLNESS:  Eric Yu  is a 83 y.o. male with a known history of Hypertension, ESRD on hemodialysis, COPD and anemia. The patient presently ED with the above chief complaints. He missed hemodialysis twice last week due to feeling sick and generalized weakness. He had abdominal pain a couple days ago which has resolved. He denies any headache but has dizziness, he denies any chest pain, palpitation or orthopnea or nocturnal dyspnea. Upon asking, the patient admitted that he also has some shortness breath and leg swelling. His EKG showed complete heart block. Cardiologist Dr. Welton Flakes suggest that admitted to stepdown unit.  PAST MEDICAL HISTORY:   Past Medical History:  Diagnosis Date  . Anemia   . Chronic kidney disease   . COPD (chronic obstructive pulmonary disease) (HCC)   . Depression   . Gout   . Hypertension   . Iron deficiency     PAST SURGICAL HISTORY:   Past Surgical History:  Procedure Laterality Date  . AV FISTULA PLACEMENT Left 07/23/2016   Procedure: INSERTION OF ARTERIOVENOUS (AV) GORE-TEX GRAFT ARM;  Surgeon: Renford Dills, MD;  Location: [facility] ORS;  Service: Vascular;  Laterality: Left;  . NO PAST SURGERIES    . PERIPHERAL VASCULAR CATHETERIZATION N/A 04/26/2016   Procedure: Dialysis/Perma Catheter Insertion;  Surgeon: Annice Needy, MD;  Location: [facility] INVASIVE CV LAB;  Service: Cardiovascular;  Laterality: N/A;  . THYROIDECTOMY      SOCIAL HISTORY:   Social History  Substance Use Topics  . Smoking status: Current Some Day Smoker  . Smokeless tobacco: Never  Used     Comment: pt is currently using nicotine patches  . Alcohol use No    FAMILY HISTORY:   Family History  Problem Relation Age of Onset  . Cancer Sister   . Hypertension Sister   . Urolithiasis Neg Hx   . Prostate cancer Neg Hx   . Kidney disease Neg Hx   . Kidney cancer Neg Hx     DRUG ALLERGIES:   Allergies  Allergen Reactions  . Cephalexin     Other reaction(s): Other (See Comments) Gross hematuria - occurred once. Not certain whether it was coincidence or causative.    REVIEW OF SYSTEMS:   Review of Systems  Constitutional: Positive for malaise/fatigue. Negative for chills and fever.  HENT: Negative for sore throat.   Eyes: Negative for blurred vision and double vision.  Respiratory: Negative for cough, hemoptysis, shortness of breath, wheezing and stridor.   Cardiovascular: Negative for chest pain, palpitations, orthopnea and leg swelling.  Gastrointestinal: Negative for abdominal pain, blood in stool, diarrhea, melena, nausea and vomiting.  Genitourinary: Negative for dysuria, flank pain and hematuria.  Musculoskeletal: Negative for back pain and joint pain.  Neurological: Positive for dizziness and weakness. Negative for sensory change, focal weakness, seizures, loss of consciousness and headaches.  Endo/Heme/Allergies: Negative for polydipsia.  Psychiatric/Behavioral: Negative for depression. The patient is not nervous/anxious.     MEDICATIONS AT HOME:   Prior to Admission medications   Medication Sig Start Date End Date Taking? Authorizing Provider  albuterol (PROVENTIL HFA;VENTOLIN HFA) 108 (90 Base) MCG/ACT inhaler Inhale 2 puffs into the lungs every morning.    Yes [provider]  amLODipine (NORVASC) 10 MG tablet Take 1 tablet (10 mg total) by mouth every morning. 02/24/17  Yes Wieting, Richard, MD  aspirin EC 81 MG tablet Take 81 mg by mouth every morning.    Yes [provider]  calcium acetate (PHOSLO) 667 MG capsule Take 1,334  mg by mouth 3 (three) times daily with meals. 09/17/16 09/17/17 Yes [provider]  cloNIDine (CATAPRES) 0.1 MG tablet Take 0.1 mg by mouth 2 (two) times daily.  07/05/16  Yes [provider]  hydrALAZINE (APRESOLINE) 25 MG tablet Take 1 tablet (25 mg total) by mouth 3 (three) times daily. 02/24/17  Yes Wieting, Richard, MD  lidocaine-prilocaine (EMLA) cream Apply 1 application topically every dialysis. 12/30/16  Yes [provider]  senna-docusate (SENOKOT-S) 8.6-50 MG tablet Take 1 tablet by mouth at bedtime as needed for mild constipation. 07/30/16  Yes Enid BaasKalisetti, Radhika, MD  torsemide (DEMADEX) 20 MG tablet Take 1 tablet (20 mg total) by mouth daily. 02/24/17  Yes Wieting, Richard, MD      VITAL SIGNS:  Blood pressure (!) 172/72, pulse 65, temperature 98.9 F (37.2 C), temperature source Oral, resp. rate 15, height 5\' 8"  (1.727 m), weight 166 lb 9.6 oz (75.6 kg), SpO2 99 %.  PHYSICAL EXAMINATION:  Physical Exam  GENERAL:  83 y.o.-year-old patient lying in the bed with no acute distress.  EYES: Pupils equal, round, reactive to light and accommodation. No scleral icterus. Extraocular muscles intact.  HEENT: Head atraumatic, normocephalic. Oropharynx and nasopharynx clear.  NECK:  Supple, no jugular venous distention. No thyroid enlargement, no tenderness.  LUNGS: Normal breath sounds bilaterally, no wheezing, rales,rhonchi or crepitation. No use of accessory muscles of respiration.  CARDIOVASCULAR: S1, S2 normal. No murmurs, rubs, or gallops.  ABDOMEN: Soft, nontender, nondistended. Bowel sounds present. No organomegaly or mass.  EXTREMITIES: Bilateral leg edema 1-2+, no cyanosis, or clubbing.  NEUROLOGIC: Cranial nerves II through XII are intact. Muscle strength 5/5 in all extremities. Sensation intact. Gait not checked.  PSYCHIATRIC: The patient is alert and oriented x 3.  SKIN: No obvious rash, lesion, or ulcer.   LABORATORY PANEL:   CBC  Recent Labs Lab  04/09/17 1247  WBC 5.0  HGB 10.8*  HCT 32.9*  PLT 168   ------------------------------------------------------------------------------------------------------------------  Chemistries   Recent Labs Lab 04/09/17 1247  NA 139  K 4.4  CL 107  CO2 21*  GLUCOSE 86  BUN 84*  CREATININE 8.24*  CALCIUM 9.3  MG 2.2  AST 12*  ALT 9*  ALKPHOS 86  BILITOT 0.7   ------------------------------------------------------------------------------------------------------------------  Cardiac Enzymes  Recent Labs Lab 04/09/17 1246  TROPONINI 0.05*   ------------------------------------------------------------------------------------------------------------------  RADIOLOGY:  Dg Chest Port 1 View  Result Date: 04/09/2017 CLINICAL DATA:  COPD. Renal failure, dialysis. Hypertension. Abdominal pain, fatigue EXAM: PORTABLE CHEST 1 VIEW COMPARISON:  02/23/2017 FINDINGS: Cardiomegaly with vascular congestion. Bilateral lower lobe airspace opacities could reflect atelectasis or edema. Possible small right effusion. No acute bony abnormality. IMPRESSION: Cardiomegaly with vascular congestion. Bibasilar atelectasis or edema. Question small right effusion. Electronically Signed   By: Charlett NoseKevin  Dover M.D.   On: 04/09/2017 13:12      IMPRESSION AND PLAN:   Complete heart block. The patient will be admitted to stepdown unit. Continue temporary pacing, Atrovent when necessary. Hold clonidine and Norvasc, continue aspirin. Follow-up Dr. Welton FlakesKhan for further recommendation. Elevated  troponin. Possible due to ESRD. Follow-up troponin and continue aspirin.  Hypertension. Continue hydralazine but hold Norvasc and clonidine. IV hydralazine when necessary.  ESRD. Missed dialysis twice. I discussed with Dr. Cassandria AngerKollurua, who will arrange hemodialysis today. Anemia of chronic disease. Stable. Tobacco abuse. Smoking cessation was counseled for 3-4 minutes. Nicotine patch.  I discussed with Dr. Welton FlakesKhan and Dr.  Wynelle LinkKolluru.  All the records are reviewed and case discussed with ED provider. Management plans discussed with the patient, family and they are in agreement.  CODE STATUS: Full code  TOTAL CRITICAL TIME TAKING CARE OF THIS PATIENT: 58 minutes.    Shaune Pollackhen, Breasia Karges M.D on 04/09/2017 at 3:21 PM  Between 7am to 6pm - Pager - [phone]  After 6pm go to [email or web address] - Scientist, research (life sciences)password [email or web address]  [facility] [place] [facility]  Office  [phone]  CC: Primary care physician; Lauro RegulusAnderson, Marshall W, MD   Note: This dictation was prepared with Dragon dictation along with smaller phrase technology. Any transcriptional errors that result from this process are unintentional.

## 2017-04-09 NOTE — Progress Notes (Signed)
[name]  ROUNDING NOTE   Subjective:   [name] [facility] on 04/09/2017 for Heart block [I45.9] Dialysis complication [T82.9XXA]   Patient has missed three hemodialysis outpatient treatments. He presents with shortness of breath.   Placed on emergent hemodialysis due to complete heart block.   Tolerating hemodialysis well.     HEMODIALYSIS FLOWSHEET:  Blood Flow Rate (mL/min): 350 mL/min Arterial Pressure (mmHg): -120 mmHg Venous Pressure (mmHg): 190 mmHg Transmembrane Pressure (mmHg): 70 mmHg Ultrafiltration Rate (mL/min): 500 mL/min Dialysate Flow Rate (mL/min): 600 ml/min Conductivity: Machine : 13.5 Conductivity: Machine : 13.5 Dialysis Fluid Bolus: Normal Saline Bolus Amount (mL): 250 mL    Objective:  Vital signs in last 24 hours:  Temp:  [97.6 F (36.4 C)-98.9 F (37.2 C)] 97.6 F (36.4 C) (07/28 1715) Pulse Rate:  [55-73] 61 (07/28 1930) Resp:  [13-18] 14 (07/28 1930) BP: (155-183)/(64-78) 183/64 (07/28 1930) SpO2:  [94 %-100 %] 99 % (07/28 1715) Weight:  [75.6 kg (166 lb 9.6 oz)] 75.6 kg (166 lb 9.6 oz) (07/28 1150)  Weight change:  Filed Weights   04/09/17 1150  Weight: 75.6 kg (166 lb 9.6 oz)    Intake/Output: No intake/output data recorded.   Intake/Output this shift:  No intake/output data recorded.  Physical Exam: General: cachectic  Head: Poor dentition. Dry oral mucosal membranes  Eyes: Anicteric, PERRL  Neck: Supple, trachea midline  Lungs:  Clear to auscultation  Heart: bradycardia  Abdomen:  Soft, nontender  Extremities: 1+ peripheral edema.  Neurologic: Nonfocal, moving all four extremities  Skin: No lesions  Access: Left AVF    Basic Metabolic Panel:  Recent Labs Lab 04/09/17 1247  NA 139  K 4.4  CL 107  CO2 21*  GLUCOSE 86  BUN 84*  CREATININE 8.24*  CALCIUM 9.3  MG 2.2  PHOS 4.5    Liver Function Tests:  Recent Labs Lab 04/09/17 1247  AST 12*  ALT 9*  ALKPHOS 86  BILITOT  0.7  PROT 7.4  ALBUMIN 3.9    Recent Labs Lab 04/09/17 1247  LIPASE 39   No results for input(s): AMMONIA in the last 168 hours.  CBC:  Recent Labs Lab 04/09/17 1247  WBC 5.0  HGB 10.8*  HCT 32.9*  MCV 89.6  PLT 168    Cardiac Enzymes:  Recent Labs Lab 04/09/17 1246  TROPONINI 0.05*    BNP: Invalid input(s): POCBNP  CBG:  Recent Labs Lab 04/09/17 1805  GLUCAP 82    Microbiology: Results for orders placed or performed during the hospital encounter of 02/23/17  Urine Culture     Status: None   Collection Time: 02/23/17 11:03 AM  Result Value Ref Range Status   Specimen Description URINE, RANDOM  Final   Special Requests Normal  Final   Culture   Final    NO GROWTH Performed at [facility], [address] [place], [place] [address]    Report Status 02/24/2017 FINAL  Final  MRSA PCR Screening     Status: None   Collection Time: 02/23/17  2:56 PM  Result Value Ref Range Status   MRSA by PCR NEGATIVE NEGATIVE Final    Comment:        The GeneXpert MRSA Assay (FDA approved for NASAL specimens only), is one component of a comprehensive MRSA colonization surveillance program. It is not intended to diagnose MRSA infection nor to guide or monitor treatment for MRSA infections.     Coagulation Studies:  Recent Labs  04/09/17 1246  LABPROT 13.9  INR 1.07    Urinalysis: No results for input(s): COLORURINE, LABSPEC, PHURINE, GLUCOSEU, HGBUR, BILIRUBINUR, KETONESUR, PROTEINUR, UROBILINOGEN, NITRITE, LEUKOCYTESUR in the last 72 hours.  Invalid input(s): APPERANCEUR    Imaging: Dg Chest Port 1 View  Result Date: 04/09/2017 CLINICAL DATA:  COPD. Renal failure, dialysis. Hypertension. Abdominal pain, fatigue EXAM: PORTABLE CHEST 1 VIEW COMPARISON:  02/23/2017 FINDINGS: Cardiomegaly with vascular congestion. Bilateral lower lobe airspace opacities could reflect atelectasis or edema. Possible small right effusion. No acute bony abnormality.  IMPRESSION: Cardiomegaly with vascular congestion. Bibasilar atelectasis or edema. Question small right effusion. Electronically Signed   By: Charlett NoseKevin  Dover M.D.   On: 04/09/2017 13:12     Medications:     acetaminophen **OR** acetaminophen  Assessment/ Plan:  [name] is a 83 y.o. black male hypertension, gout, anemia, depression, COPD and tobacco abuse   M[facility]   1. End-stage renal disease [facility]. Missed one week of hemodialysis. Uremic on presentation - Resume dialysis today, orders prepared. AVF. Seen and examined on hemodialysis.  Similar presentation last month.   2. Anemia of chronic kidney disease: with iron deficiency. Hemoglobin 10.8 - hold epo  3. Secondary hyperparathyroidism: calcium and phosphorus at goal.  - monitor phos level - Calcium acetate with meals.   4. Hypertension: blood pressure elevated on treatment. With complete heart block.  - Avoid beta blockers and clonidine - Appreciate cardiology input.    LOS: 0 Kayanna Mckillop 7/28/20188:05 PM

## 2017-04-10 ENCOUNTER — Inpatient Hospital Stay
Admit: 2017-04-10 | Discharge: 2017-04-10 | Disposition: A | Discharge status: Home / Self Care | Payer: Medicare HMO | Attending: Adult Health | Admitting: Adult Health

## 2017-04-10 DIAGNOSIS — J810 Acute pulmonary edema: Secondary | ICD-10-CM

## 2017-04-10 DIAGNOSIS — I169 Hypertensive crisis, unspecified: Secondary | ICD-10-CM

## 2017-04-10 LAB — BASIC METABOLIC PANEL
Anion gap: 7 (ref 5–15)
BUN: 38 mg/dL — AB (ref 6–20)
CO2: 28 mmol/L (ref 22–32)
CREATININE: 4.47 mg/dL — AB (ref 0.61–1.24)
Calcium: 8.5 mg/dL — ABNORMAL LOW (ref 8.9–10.3)
Chloride: 103 mmol/L (ref 101–111)
GFR calc Af Amer: 13 mL/min — ABNORMAL LOW (ref >60)
GFR, EST NON AFRICAN AMERICAN: 11 mL/min — AB (ref >60)
Glucose, Bld: 78 mg/dL (ref 65–99)
POTASSIUM: 3.8 mmol/L (ref 3.5–5.1)
SODIUM: 138 mmol/L (ref 135–145)

## 2017-04-10 LAB — MAGNESIUM: MAGNESIUM: 2 mg/dL (ref 1.7–2.4)

## 2017-04-10 LAB — ECHOCARDIOGRAM COMPLETE
Height: 68 in
Weight: 2483.2 oz

## 2017-04-10 LAB — PHOSPHORUS: PHOSPHORUS: 2.8 mg/dL (ref 2.5–4.6)

## 2017-04-10 MED ORDER — FENTANYL BOLUS VIA INFUSION: 50.0000 ug | INTRAVENOUS | Status: DC | PRN

## 2017-04-10 NOTE — Clinical Social Work Note (Signed)
CSW received consult that the patient may need placement and resources. CSW will visit when able.  Argentina PonderKaren Martha Ahmir Bracken, MSW, Theresia MajorsLCSWA [phone]

## 2017-04-10 NOTE — Progress Notes (Signed)
Patient's heart rate drops into the 40's and occassionally the 30.  Seems to occur when he is asleep.  Waking him brings his heart rate up to the 60's.

## 2017-04-10 NOTE — Progress Notes (Signed)
Heart rhythm has been SR for majority of the afternoon, with occasional missed beats.

## 2017-04-10 NOTE — Progress Notes (Signed)
CH responded to an OR for prayer. Pt is asleep. MD stated that Pt had a long night with dialysis. CH prayed silently outside of room.    04/10/17 1000  Clinical Encounter Type  Visited With Patient;Health care provider  Visit Type Initial;Spiritual support  Referral From Nurse  Consult/Referral To Chaplain  Spiritual Encounters  Spiritual Needs Prayer

## 2017-04-10 NOTE — [place] FL2 (Signed)
[place] MEDICAID FL2 LEVEL OF CARE SCREENING TOOL     IDENTIFICATION  Patient Name: Eric Yu Birthdate: 04-03-1934 Sex: male Admission Date (Current Location): 04/09/2017  [place]   Facility and Address:  [facility], [address], [place], [place] [address]      Provider Number: [id number]  Attending Physician Name and Address:  Houston SirenSainani, Vivek J, MD  Relative Name and Phone Number:       Current Level of Care: Hospital Recommended Level of Care: Skilled Nursing Facility Prior Approval Number:    Date Approved/Denied:   PASRR Number: [id number]  Discharge Plan: SNF    Current Diagnoses: Patient Active Problem List   Diagnosis Date Noted  . Hypertensive crisis   . Acute pulmonary edema (HCC)   . Complete heart block (HCC) 04/09/2017  . Hyperkalemia 07/29/2016  . Physical debility   . Palliative care by specialist   . DNR (do not resuscitate)   . ESRD (end stage renal disease) on dialysis (HCC)   . Symptomatic anemia 05/08/2016  . Severe major depression, single episode (HCC) 04/28/2016  . Injury of kidney 01/08/2015  . Blood loss, postoperative 01/06/2015  . Hypertensive left ventricular hypertrophy 12/26/2014  . Gout 01/16/2014  . Benign hypertension 05/19/2012  . Current tobacco use 05/19/2012    Orientation RESPIRATION BLADDER Height & Weight     Self, Time, Place  Normal Continent Weight: 155 lb 3.2 oz (70.4 kg) Height:  5\' 8"  (172.7 cm)  BEHAVIORAL SYMPTOMS/MOOD NEUROLOGICAL BOWEL NUTRITION STATUS      Continent Diet (Renal diet)  AMBULATORY STATUS COMMUNICATION OF NEEDS Skin   Supervision Verbally Normal                       Personal Care Assistance Level of Assistance  Bathing, Feeding, Dressing Bathing Assistance: Limited assistance Feeding assistance: Independent Dressing Assistance: Limited assistance     Functional Limitations Info             SPECIAL CARE FACTORS  FREQUENCY                       Contractures      Additional Factors Info  Code Status, Allergies Code Status Info: DNR Allergies Info: Cephalexin           Current Medications (04/10/2017):  This is the current hospital active medication list Current Facility-Administered Medications  Medication Dose Route Frequency Provider Last Rate Last Dose  . 0.9 %  sodium chloride infusion  250 mL Intravenous PRN Shaune Pollackhen, Qing, MD      . acetaminophen (TYLENOL) tablet 650 mg  650 mg Oral Q6H PRN Shaune Pollackhen, Qing, MD   650 mg at 04/09/17 1934   Or  . acetaminophen (TYLENOL) suppository 650 mg  650 mg Rectal Q6H PRN Shaune Pollackhen, Qing, MD      . albuterol (PROVENTIL) (2.5 MG/3ML) 0.083% nebulizer solution 2.5 mg  2.5 mg Nebulization Q2H PRN Shaune Pollackhen, Qing, MD      . aspirin EC tablet 81 mg  81 mg Oral q morning - 10a Shaune Pollackhen, Qing, MD   81 mg at 04/10/17 0856  . bisacodyl (DULCOLAX) EC tablet 5 mg  5 mg Oral Daily PRN Shaune Pollackhen, Qing, MD      . calcium acetate (PHOSLO) capsule 1,334 mg  1,334 mg Oral TID WC Shaune Pollackhen, Qing, MD   1,334 mg at 04/10/17 1201  . heparin injection 5,000 Units  5,000 Units  Subcutaneous Q8H Shaune Pollackhen, Qing, MD   5,000 Units at 04/10/17 1428  . hydrALAZINE (APRESOLINE) injection 10 mg  10 mg Intravenous Q6H PRN Shaune Pollackhen, Qing, MD      . hydrALAZINE (APRESOLINE) tablet 25 mg  25 mg Oral TID Shaune Pollackhen, Qing, MD   25 mg at 04/10/17 0856  . nicotine (NICODERM CQ - dosed in mg/24 hours) patch 21 mg  21 mg Transdermal Daily Shaune Pollackhen, Qing, MD   21 mg at 04/10/17 [id number]  . ondansetron (ZOFRAN) tablet 4 mg  4 mg Oral Q6H PRN Shaune Pollackhen, Qing, MD       Or  . ondansetron [facility](ZOFRAN) injection 4 mg  4 mg Intravenous Q6H PRN Shaune Pollackhen, Qing, MD      . senna-docusate (Senokot-S) tablet 1 tablet  1 tablet Oral QHS PRN Shaune Pollackhen, Qing, MD      . sodium chloride flush (NS) 0.9 % injection 3 mL  3 mL Intravenous Q12H Shaune Pollackhen, Qing, MD   3 mL at 04/10/17 0857  . sodium chloride flush (NS) 0.9 % injection 3 mL  3 mL Intravenous PRN Shaune Pollackhen, Qing, MD      .  torsemide [facility](DEMADEX) tablet 20 mg  20 mg Oral Daily Shaune Pollackhen, Qing, MD   20 mg at 04/10/17 [id number]     Discharge Medications: Please see discharge summary for a list of discharge medications.  Relevant Imaging Results:  Relevant Lab Results:   Additional Information SS# [id number]  Judi CongKaren M Aleksandar Duve, LCSW

## 2017-04-10 NOTE — Progress Notes (Signed)
[name]  ROUNDING NOTE   Subjective:   Hemodialysis treatment yesterday emergently. UF of 1 liter  Objective:  Vital signs in last 24 hours:  Temp:  [97.5 F (36.4 C)-98.9 F (37.2 C)] 98.3 F (36.8 C) (07/29 0837) Pulse Rate:  [41-84] 77 (07/29 0837) Resp:  [13-18] 16 (07/29 0837) BP: (142-205)/(29-99) 143/72 (07/29 0837) SpO2:  [92 %-100 %] 96 % (07/29 0837) Weight:  [70.4 kg (155 lb 3.2 oz)-75.6 kg (166 lb 9.6 oz)] 70.4 kg (155 lb 3.2 oz) (07/29 0657)  Weight change:  Filed Weights   04/09/17 1150 04/09/17 2100 04/10/17 0657  Weight: 75.6 kg (166 lb 9.6 oz) 70.7 kg (155 lb 13.8 oz) 70.4 kg (155 lb 3.2 oz)    Intake/Output: I/O last 3 completed shifts: In: 303 [P.O.:300; I.V.:3] Out: 1200 [Urine:200; Other:1000]   Intake/Output this shift:  Total I/O In: 360 [P.O.:360] Out: -   Physical Exam: General: cachectic  Head: Poor dentition. Dry oral mucosal membranes  Eyes: Anicteric, PERRL  Neck: Supple, trachea midline  Lungs:  Clear to auscultation  Heart: Regular  Abdomen:  Soft, nontender  Extremities: trace peripheral edema.  Neurologic: Nonfocal, moving all four extremities  Skin: No lesions  Access: Left AVF    Basic Metabolic Panel:  Recent Labs Lab 04/09/17 1247 04/10/17 0434  NA 139 138  K 4.4 3.8  CL 107 103  CO2 21* 28  GLUCOSE 86 78  BUN 84* 38*  CREATININE 8.24* 4.47*  CALCIUM 9.3 8.5*  MG 2.2 2.0  PHOS 4.5 2.8    Liver Function Tests:  Recent Labs Lab 04/09/17 1247  AST 12*  ALT 9*  ALKPHOS 86  BILITOT 0.7  PROT 7.4  ALBUMIN 3.9    Recent Labs Lab 04/09/17 1247  LIPASE 39   No results for input(s): AMMONIA in the last 168 hours.  CBC:  Recent Labs Lab 04/09/17 1247  WBC 5.0  HGB 10.8*  HCT 32.9*  MCV 89.6  PLT 168    Cardiac Enzymes:  Recent Labs Lab 04/09/17 1246 04/09/17 2100  TROPONINI 0.05* 0.05*    BNP: Invalid input(s): POCBNP  CBG:  Recent Labs Lab 04/09/17 1805  04/09/17 2048  GLUCAP 82 74    Microbiology: Results for orders placed or performed during the hospital encounter of 04/09/17  MRSA PCR Screening     Status: None   Collection Time: 04/09/17  8:48 PM  Result Value Ref Range Status   MRSA by PCR NEGATIVE NEGATIVE Final    Comment:        The GeneXpert MRSA Assay (FDA approved for NASAL specimens only), is one component of a comprehensive MRSA colonization surveillance program. It is not intended to diagnose MRSA infection nor to guide or monitor treatment for MRSA infections.     Coagulation Studies:  Recent Labs  04/09/17 1246  LABPROT 13.9  INR 1.07    Urinalysis: No results for input(s): COLORURINE, LABSPEC, PHURINE, GLUCOSEU, HGBUR, BILIRUBINUR, KETONESUR, PROTEINUR, UROBILINOGEN, NITRITE, LEUKOCYTESUR in the last 72 hours.  Invalid input(s): APPERANCEUR    Imaging: Dg Chest Port 1 View  Result Date: 04/09/2017 CLINICAL DATA:  COPD. Renal failure, dialysis. Hypertension. Abdominal pain, fatigue EXAM: PORTABLE CHEST 1 VIEW COMPARISON:  02/23/2017 FINDINGS: Cardiomegaly with vascular congestion. Bilateral lower lobe airspace opacities could reflect atelectasis or edema. Possible small right effusion. No acute bony abnormality. IMPRESSION: Cardiomegaly with vascular congestion. Bibasilar atelectasis or edema. Question small right effusion. Electronically Signed   By: Charlett NoseKevin  Dover  M.D.   On: 04/09/2017 13:12     Medications:   . sodium chloride     . aspirin EC  81 mg Oral q morning - 10a  . calcium acetate  1,334 mg Oral TID WC  . heparin  5,000 Units Subcutaneous Q8H  . hydrALAZINE  25 mg Oral TID  . nicotine  21 mg Transdermal Daily  . sodium chloride flush  3 mL Intravenous Q12H  . torsemide  20 mg Oral Daily   sodium chloride, acetaminophen **OR** acetaminophen, albuterol, bisacodyl, hydrALAZINE, ondansetron **OR** ondansetron (ZOFRAN) IV, senna-docusate, sodium chloride flush  Assessment/ Plan:  Mr.  Eric Yu is a 83 y.o. black male hypertension, gout, anemia, depression, COPD and tobacco abuse admitted on 04/09/2017 for Heart block [I45.9] Dialysis complication [T82.9XXA]   M[facility]   1. [facility]. Missed one week of hemodialysis. Uremic on presentation - Resumed dialysis 7/28 - resume TTS schedule. Tolerated treatment well yesterday.  - Consult palliative care for goals of care. Patient says he wants to continue dialysis but does not go.   2. Anemia of chronic kidney disease: with iron deficiency. Hemoglobin 10.8 - EPO held on last treatment.   3. Secondary hyperparathyroidism: calcium and phosphorus at goal.  - monitor phos level - Calcium acetate with meals.   4. Hypertension: blood pressure elevated on treatment. With complete heart block.  - Avoid beta blockers and clonidine - Appreciate cardiology input.    LOS: 1 Eric Yu 7/29/201810:36 AM

## 2017-04-10 NOTE — Progress Notes (Addendum)
Patient was in varying types of heart block, primarily Mobitz type I and II for most of the morning.  He was asymptomatic.  He becomes bradycardic - into the 30's and 40's for very short periods .  Dr. Cherlynn KaiserSainani aware.

## 2017-04-10 NOTE — Progress Notes (Signed)
*  PRELIMINARY RESULTS* Echocardiogram 2D Echocardiogram has been performed.  Eric Yu 04/10/2017, 7:54 AM

## 2017-04-10 NOTE — Progress Notes (Signed)
Patient transferred to floor after dialysis.  No complaints of pain or discomfort.  Medications tolerate. BP within parameters with scheduled meds. No distress noted.  Patient transferred to 2A at this time.  Continues with Heart block.  NP aware.  Code status DNR.

## 2017-04-10 NOTE — Progress Notes (Signed)
[employer] [facility] [place] [facility]   PATIENT NAME: Eric Yu    MR#:  [id number]  DATE OF BIRTH:  1933-10-11  SUBJECTIVE:   Patient presented yesterday with fatigue and weakness and noted to be in complete heart block. He had missed his hemodialysis 2 and had urgent hemodialysis done and heart rates have improved. Seen by cardiology and initially admitted to the ICU but heart rates have improved and no urgent need for pacemaker presently. Patient heart rates does drop down to the 30s when he sleeps but when he is aroused HR improves.    REVIEW OF SYSTEMS:    Review of Systems  Constitutional: Negative for chills and fever.  HENT: Negative for congestion and tinnitus.   Eyes: Negative for blurred vision and double vision.  Respiratory: Negative for cough, shortness of breath and wheezing.   Cardiovascular: Negative for chest pain, orthopnea and PND.  Gastrointestinal: Negative for abdominal pain, diarrhea, nausea and vomiting.  Genitourinary: Negative for dysuria and hematuria.  Neurological: Positive for weakness. Negative for dizziness, sensory change and focal weakness.  All other systems reviewed and are negative.   Nutrition: Renal Tolerating Diet: Yes Tolerating PT: Await Eval.   DRUG ALLERGIES:   Allergies  Allergen Reactions  . Cephalexin     Other reaction(s): Other (See Comments) Gross hematuria - occurred once. Not certain whether it was coincidence or causative.    VITALS:  Blood pressure (!) 180/51, pulse 78, temperature 97.6 F (36.4 C), temperature source Oral, resp. rate 18, height 5\' 8"  (1.727 m), weight 70.4 kg (155 lb 3.2 oz), SpO2 92 %.  PHYSICAL EXAMINATION:   Physical Exam  GENERAL:  83 y.o.-year-old patient lying in bed in no acute distress.  EYES: Pupils equal, round, reactive to light and accommodation. No scleral icterus. Extraocular muscles intact.  HEENT: Head atraumatic, normocephalic. Oropharynx and nasopharynx clear.   NECK:  Supple, no jugular venous distention. No thyroid enlargement, no tenderness.  LUNGS: Normal breath sounds bilaterally, no wheezing, rales, rhonchi. No use of accessory muscles of respiration.  CARDIOVASCULAR: S1, S2 normal. No murmurs, rubs, or gallops.  ABDOMEN: Soft, nontender, nondistended. Bowel sounds present. No organomegaly or mass.  EXTREMITIES: No cyanosis, clubbing or edema b/l.    NEUROLOGIC: Cranial nerves II through XII are intact. No focal Motor or sensory deficits b/l.  Globally weak PSYCHIATRIC: The patient is alert and oriented x 3.  SKIN: No obvious rash, lesion, or ulcer.    LABORATORY PANEL:   CBC  Recent Labs Lab 04/09/17 1247  WBC 5.0  HGB 10.8*  HCT 32.9*  PLT 168   ------------------------------------------------------------------------------------------------------------------  Chemistries   Recent Labs Lab 04/09/17 1247 04/10/17 0434  NA 139 138  K 4.4 3.8  CL 107 103  CO2 21* 28  GLUCOSE 86 78  BUN 84* 38*  CREATININE 8.24* 4.47*  CALCIUM 9.3 8.5*  MG 2.2 2.0  AST 12*  --   ALT 9*  --   ALKPHOS 86  --   BILITOT 0.7  --    ------------------------------------------------------------------------------------------------------------------  Cardiac Enzymes  Recent Labs Lab 04/09/17 2100  TROPONINI 0.05*   ------------------------------------------------------------------------------------------------------------------  RADIOLOGY:  Dg Chest Port 1 View  Result Date: 04/09/2017 CLINICAL DATA:  COPD. Renal failure, dialysis. Hypertension. Abdominal pain, fatigue EXAM: PORTABLE CHEST 1 VIEW COMPARISON:  02/23/2017 FINDINGS: Cardiomegaly with vascular congestion. Bilateral lower lobe airspace opacities could reflect atelectasis or edema. Possible small right effusion. No acute bony abnormality. IMPRESSION: Cardiomegaly with vascular congestion. Bibasilar  atelectasis or edema. Question small right effusion. Electronically Signed   By:  Charlett NoseKevin  Dover M.D.   On: 04/09/2017 13:12     ASSESSMENT AND PLAN:   83 year old male with past medical history of end-stage renal disease on hemodialysis, hypertension, depression, COPD, history of gout, chronic anemia who presented to the hospital due to fatigue, weakness and noted to be in complete heart block after having missed his hemodialysis for 2 sessions.  1. Complete heart block-this was secondary to patient's missing his hemodialysis although he was not hyperkalemic or have any other electrolyte abnormalities.   - [facility] have improved and transferred out.  - seen by Cardiology no plans for pacemaker.  Improved w/ HD.   - follow lytes.    2. ESRD on HD - no acute need for HD now.  - nephro following and will get dialysis in a.m.   3. Essential HTN - continue hydralazine, clonidine on hold given the significant bradycardia. -Continue as needed IV hydralazine.  4. Secondary hyperparathyroidism-continue PhosLo.  5. Tobacco abuse-continue nicotine patch.  Await Palliative Care consult to discuss goals of care as pt. Keeps missing his HD.   All the records are reviewed and case discussed with Care Management/Social Worker. Management plans discussed with the patient, family and they are in agreement.  CODE STATUS: DNR  DVT Prophylaxis: Hep. SQ  TOTAL TIME TAKING CARE OF THIS PATIENT: 30 minutes.   POSSIBLE D/C IN 1-2 DAYS, DEPENDING ON CLINICAL CONDITION.   Houston SirenSAINANI,Allizon Woznick J M.D on 04/10/2017 at 1:18 PM  Between 7am to 6pm - Pager - [phone]  After 6pm go to [email or web address] - Scientist, research (life sciences)password [email or web address]  [employer] [facility] [place] [facility]  Office  [phone]  CC: Primary care physician; Lauro RegulusAnderson, Marshall W, MD

## 2017-04-10 NOTE — Progress Notes (Signed)
SUBJECTIVE: Patient is feeling much better after getting dialysis   Vitals:   04/10/17 0600 04/10/17 0657 04/10/17 0837 04/10/17 1137  BP: (!) 163/55 (!) 169/65 (!) 143/72 (!) 180/51  Pulse: (!) 57 63 77 78  Resp: 14 16 16 18   Temp:  97.6 F (36.4 C) 98.3 F (36.8 C) 97.6 F (36.4 C)  TempSrc:  Oral  Oral  SpO2: 98% 97% 96% 92%  Weight:  155 lb 3.2 oz (70.4 kg)    Height:        Intake/Output Summary (Last 24 hours) at 04/10/17 1317 Last data filed at 04/10/17 0900  Gross per 24 hour  Intake              663 ml  Output             1200 ml  Net             -537 ml    LABS: Basic Metabolic Panel:  Recent Labs  16/06/9606/28/18 1247 04/10/17 0434  NA 139 138  K 4.4 3.8  CL 107 103  CO2 21* 28  GLUCOSE 86 78  BUN 84* 38*  CREATININE 8.24* 4.47*  CALCIUM 9.3 8.5*  MG 2.2 2.0  PHOS 4.5 2.8   Liver Function Tests:  Recent Labs  04/09/17 1247  AST 12*  ALT 9*  ALKPHOS 86  BILITOT 0.7  PROT 7.4  ALBUMIN 3.9    Recent Labs  04/09/17 1247  LIPASE 39   CBC:  Recent Labs  04/09/17 1247  WBC 5.0  HGB 10.8*  HCT 32.9*  MCV 89.6  PLT 168   Cardiac Enzymes:  Recent Labs  04/09/17 1246 04/09/17 2100  TROPONINI 0.05* 0.05*   BNP: Invalid input(s): POCBNP D-Dimer: No results for input(s): DDIMER in the last 72 hours. Hemoglobin A1C: No results for input(s): HGBA1C in the last 72 hours. Fasting Lipid Panel: No results for input(s): CHOL, HDL, LDLCALC, TRIG, CHOLHDL, LDLDIRECT in the last 72 hours. Thyroid Function Tests: No results for input(s): TSH, T4TOTAL, T3FREE, THYROIDAB in the last 72 hours.  Invalid input(s): FREET3 Anemia Panel: No results for input(s): VITAMINB12, FOLATE, FERRITIN, TIBC, IRON, RETICCTPCT in the last 72 hours.   PHYSICAL EXAM General: Well developed, well nourished, in no acute distress HEENT:  Normocephalic and atramatic Neck:  No JVD.  Lungs: Clear bilaterally to auscultation and percussion. Heart: HRRR . Normal S1  and S2 without gallops or murmurs.  Abdomen: Bowel sounds are positive, abdomen soft and non-tender  Msk:  Back normal, normal gait. Normal strength and tone for age. Extremities: No clubbing, cyanosis or edema.   Neuro: Alert and oriented X 3. Psych:  Good affect, responds appropriately  TELEMETRY:Sinus rhythm with sinus bradycardia  ASSESSMENT AND PLAN: A-V dissociation with complete AV block on admission after dialysis still has sinus bradycardia will repeat EKG. Patient does not want to have pacemaker.  Active Problems:   Complete heart block (HCC)   Hypertensive crisis   Acute pulmonary edema (HCC)    Eric Yu A, MD, [facility] 04/10/2017 1:17 PM

## 2017-04-11 DIAGNOSIS — I442 Atrioventricular block, complete: Principal | ICD-10-CM

## 2017-04-11 DIAGNOSIS — N186 End stage renal disease: Secondary | ICD-10-CM

## 2017-04-11 DIAGNOSIS — Z992 Dependence on renal dialysis: Secondary | ICD-10-CM

## 2017-04-11 DIAGNOSIS — Z515 Encounter for palliative care: Secondary | ICD-10-CM

## 2017-04-11 DIAGNOSIS — Z7189 Other specified counseling: Secondary | ICD-10-CM

## 2017-04-11 LAB — BASIC METABOLIC PANEL
ANION GAP: 6 (ref 5–15)
BUN: 47 mg/dL — ABNORMAL HIGH (ref 6–20)
CHLORIDE: 104 mmol/L (ref 101–111)
CO2: 28 mmol/L (ref 22–32)
Calcium: 9.1 mg/dL (ref 8.9–10.3)
Creatinine, Ser: 5.56 mg/dL — ABNORMAL HIGH (ref 0.61–1.24)
GFR calc Af Amer: 10 mL/min — ABNORMAL LOW (ref >60)
GFR, EST NON AFRICAN AMERICAN: 9 mL/min — AB (ref >60)
GLUCOSE: 75 mg/dL (ref 65–99)
POTASSIUM: 4.3 mmol/L (ref 3.5–5.1)
SODIUM: 138 mmol/L (ref 135–145)

## 2017-04-11 LAB — CBC
HCT: 29.5 % — ABNORMAL LOW (ref 40.0–52.0)
HEMOGLOBIN: 9.7 g/dL — AB (ref 13.0–18.0)
MCH: 29 pg (ref 26.0–34.0)
MCHC: 32.8 g/dL (ref 32.0–36.0)
MCV: 88.4 fL (ref 80.0–100.0)
PLATELETS: 166 10*3/uL (ref 150–440)
RBC: 3.34 MIL/uL — AB (ref 4.40–5.90)
RDW: 13.4 % (ref 11.5–14.5)
WBC: 5.2 10*3/uL (ref 3.8–10.6)

## 2017-04-11 MED ORDER — NEPRO/CARBSTEADY PO LIQD
237.0000 mL | Freq: Two times a day (BID) | ORAL | Status: DC
  Administered 2017-04-12 (×2): 237 mL via ORAL

## 2017-04-11 MED ORDER — HYDRALAZINE HCL 50 MG PO TABS
50.0000 mg | ORAL_TABLET | Freq: Three times a day (TID) | ORAL | Status: DC
  Administered 2017-04-11: 50 mg via ORAL
  Filled 2017-04-11 (×2): qty 1

## 2017-04-11 MED ORDER — HYDRALAZINE HCL 50 MG PO TABS
100.0000 mg | ORAL_TABLET | Freq: Three times a day (TID) | ORAL | Status: DC
  Administered 2017-04-11: 50 mg via ORAL
  Administered 2017-04-11 – 2017-04-13 (×4): 100 mg via ORAL
  Filled 2017-04-11 (×4): qty 2

## 2017-04-11 MED ORDER — LOSARTAN POTASSIUM 50 MG PO TABS: 50.0000 mg | ORAL_TABLET | Freq: Every day | ORAL | Status: DC

## 2017-04-11 MED ORDER — LOSARTAN POTASSIUM 50 MG PO TABS
100.0000 mg | ORAL_TABLET | Freq: Every day | ORAL | Status: DC
  Administered 2017-04-11 – 2017-04-12 (×2): 100 mg via ORAL
  Filled 2017-04-11 (×2): qty 2

## 2017-04-11 MED ORDER — LOSARTAN POTASSIUM 50 MG PO TABS
50.0000 mg | ORAL_TABLET | Freq: Once | ORAL | Status: AC
  Administered 2017-04-11: 50 mg via ORAL
  Filled 2017-04-11: qty 1

## 2017-04-11 MED ORDER — AMLODIPINE BESYLATE 10 MG PO TABS
10.0000 mg | ORAL_TABLET | Freq: Every day | ORAL | Status: DC
  Administered 2017-04-11 – 2017-04-13 (×3): 10 mg via ORAL
  Filled 2017-04-11 (×3): qty 1

## 2017-04-11 NOTE — Progress Notes (Signed)
   Hd start, pt alert, no c/o, stable 

## 2017-04-11 NOTE — Progress Notes (Signed)
CH responded to an OR for AD. CH visited pt., but pt. was asleep at the time of this visit. CH will do a follow up visit with this pt., as needed this afternoon to educate him on AD.    04/11/17 1500  Clinical Encounter Type  Visited With Patient  Visit Type Initial;Spiritual support  Referral From Nurse  Consult/Referral To Chaplain  Spiritual Encounters  Spiritual Needs Brochure;Other (Comment)

## 2017-04-11 NOTE — Progress Notes (Signed)
Pre hd assessment  

## 2017-04-11 NOTE — Progress Notes (Signed)
Pt. Arrived from dialysis, dressing to LUE A/V fistula is clean, dry and intact. Fistula is positive for thrill and bruit. VSS. Pt. Eating dinner at this time. Will continue to monitor pt.

## 2017-04-11 NOTE — Progress Notes (Signed)
Dr. Sheryle Hailiamond returned page no new orders at this time.

## 2017-04-11 NOTE — Progress Notes (Signed)
SUBJECTIVE: Patient is feeling much better denies any chest pain or shortness of breath   Vitals:   04/10/17 1619 04/10/17 1912 04/10/17 1914 04/11/17 0326  BP: (!) 166/73 (!) 190/77 (!) 187/68 (!) 175/77  Pulse: 66 68 69 68  Resp:  20  18  Temp:  97.7 F (36.5 C)  98.8 F (37.1 C)  TempSrc:  Oral  Oral  SpO2:  95% 96% 94%  Weight:      Height:        Intake/Output Summary (Last 24 hours) at 04/11/17 [id number] Last data filed at 04/11/17 0200  Gross per 24 hour  Intake              600 ml  Output                0 ml  Net              600 ml    LABS: Basic Metabolic Panel:  Recent Labs  95/28/4107/28/18 1247 04/10/17 0434 04/11/17 0444  NA 139 138 138  K 4.4 3.8 4.3  CL 107 103 104  CO2 21* 28 28  GLUCOSE 86 78 75  BUN 84* 38* 47*  CREATININE 8.24* 4.47* 5.56*  CALCIUM 9.3 8.5* 9.1  MG 2.2 2.0  --   PHOS 4.5 2.8  --    Liver Function Tests:  Recent Labs  04/09/17 1247  AST 12*  ALT 9*  ALKPHOS 86  BILITOT 0.7  PROT 7.4  ALBUMIN 3.9    Recent Labs  04/09/17 1247  LIPASE 39   CBC:  Recent Labs  04/09/17 1247 04/11/17 0444  WBC 5.0 5.2  HGB 10.8* 9.7*  HCT 32.9* 29.5*  MCV 89.6 88.4  PLT 168 166   Cardiac Enzymes:  Recent Labs  04/09/17 1246 04/09/17 2100  TROPONINI 0.05* 0.05*   BNP: Invalid input(s): POCBNP D-Dimer: No results for input(s): DDIMER in the last 72 hours. Hemoglobin A1C: No results for input(s): HGBA1C in the last 72 hours. Fasting Lipid Panel: No results for input(s): CHOL, HDL, LDLCALC, TRIG, CHOLHDL, LDLDIRECT in the last 72 hours. Thyroid Function Tests: No results for input(s): TSH, T4TOTAL, T3FREE, THYROIDAB in the last 72 hours.  Invalid input(s): FREET3 Anemia Panel: No results for input(s): VITAMINB12, FOLATE, FERRITIN, TIBC, IRON, RETICCTPCT in the last 72 hours.   PHYSICAL EXAM General: Well developed, well nourished, in no acute distress HEENT:  Normocephalic and atramatic Neck:  No JVD.  Lungs: Clear  bilaterally to auscultation and percussion. Heart: HRRR . Normal S1 and S2 without gallops or murmurs.  Abdomen: Bowel sounds are positive, abdomen soft and non-tender  Msk:  Back normal, normal gait. Normal strength and tone for age. Extremities: No clubbing, cyanosis or edema.   Neuro: Alert and oriented X 3. Psych:  Good affect, responds appropriately  TELEMETRY:Sinus rhythm with severe first-degree AV block  ASSESSMENT AND PLAN: Sinus rhythm with severe first-degree AV block on current EKG with heart rate over 60. Discussed with Dr. Daisy BlossomParachoe  about permanent pacemaker, and he feels that A-V dissociation after missing dialysis for a week may lead to A-V dissociation since heart rate is much better now and does not have A-V dissociation thus is not a candidate for pacemaker. Patient also is feeling much better and can be discharged with follow-up in the office on Thursday at 10:00.  Active Problems:   Complete heart block (HCC)   Hypertensive crisis   Acute pulmonary edema (HCC)  Osamah Schmader A, MD, [facility] 04/11/2017 8:22 AM

## 2017-04-11 NOTE — Evaluation (Signed)
Physical Therapy Evaluation Patient Details Name: Lorra HalsJohn Oakey MRN: [id number] DOB: Nov 03, 1933 Today's Date: 04/11/2017   History of Present Illness  Patient is a pleasant 83 y/o male that presents with fatigue and abdominal pain, apparently he did not go to his dialysis appointments last week.   Clinical Impression  Patient is a pleasant 83 y/o male that presents from home after missing multiple dialysis appointments last week and now presenting with hypertension and weakness. His BP is initially high, however no change or symptoms noted throughout this session. He is able to perform all bed mobility without assistance and is able to perform sit to stand transfers without PT assistance. He does present with weakness relative to his expected baseline, though is able to manage in the room without assistance from therapist. He would likely benefit from HHPT to address weakness and fatigue identified in this evaluation.        Follow Up Recommendations Home health PT    Equipment Recommendations  Rolling walker with 5" wheels    Recommendations for Other Services       Precautions / Restrictions Precautions Precautions: Fall Restrictions Weight Bearing Restrictions: No      Mobility  Bed Mobility Overal bed mobility: Needs Assistance Bed Mobility: Sit to Supine       Sit to supine: Min guard   General bed mobility comments: Patient is able to transfer himself without assistance or cuing, just observation for technique.   Transfers Overall transfer level: Needs assistance Equipment used: Rolling walker (2 wheeled) Transfers: Sit to/from Stand Sit to Stand: Min guard         General transfer comment: Patient requires no cuing, though prolonged time to complete transfer.   Ambulation/Gait Ambulation/Gait assistance: Supervision   Assistive device: Rolling walker (2 wheeled) Gait Pattern/deviations: WFL(Within Functional Limits)     General Gait Details: Patient  demonstrated flexed trunk gait with decreased stride length bilaterally.   Stairs            Wheelchair Mobility    Modified Rankin (Stroke Patients Only)       Balance Overall balance assessment: Needs assistance Sitting-balance support: No upper extremity supported Sitting balance-Leahy Scale: Good     Standing balance support: Bilateral upper extremity supported Standing balance-Leahy Scale: Good                               Pertinent Vitals/Pain Pain Assessment: No/denies pain    Home Living Family/patient expects to be discharged to:: Private residence Living Arrangements: Alone Available Help at Discharge: Personal care attendant Type of Home: House Home Access: Ramped entrance     Home Layout: One level Home Equipment: Environmental consultantWalker - 2 wheels;Cane - single point      Prior Function Level of Independence: Needs assistance   Gait / Transfers Assistance Needed: Ind amb with RW in home           Hand Dominance   Dominant Hand: Right    Extremity/Trunk Assessment                Communication   Communication: No difficulties  Cognition                                              General Comments      Exercises Other Exercises Other  Exercises: Assisted and observed patient on and off the commode with verbal cuing and cga x 1 as well as changing linens secondary to wetness.    Assessment/Plan    PT Assessment Patient needs continued PT services  PT Problem List Decreased strength;Decreased mobility;Decreased activity tolerance;Decreased balance;Decreased knowledge of use of DME       PT Treatment Interventions DME instruction;Therapeutic activities;Therapeutic exercise;Gait training;Stair training;Balance training;Functional mobility training;Neuromuscular re-education    PT Goals (Current goals can be found in the Care Plan section)  Acute Rehab PT Goals Patient Stated Goal: To return home safely PT  Goal Formulation: With patient Time For Goal Achievement: 04/25/17 Potential to Achieve Goals: Good    Frequency Min 2X/week   Barriers to discharge Decreased caregiver support      Co-evaluation               AM-PAC PT "6 Clicks" Daily Activity  Outcome Measure Difficulty turning over in bed (including adjusting bedclothes, sheets and blankets)?: None Difficulty moving from lying on back to sitting on the side of the bed? : None Difficulty sitting down on and standing up from a chair with arms (e.g., wheelchair, bedside commode, etc,.)?: A Little Help needed moving to and from a bed to chair (including a wheelchair)?: A Little Help needed walking in hospital room?: A Little Help needed climbing 3-5 steps with a railing? : A Little 6 Click Score: 20    End of Session Equipment Utilized During Treatment: Gait belt Activity Tolerance: Patient tolerated treatment well Patient left: in bed;with bed alarm set;with call bell/phone within reach Nurse Communication: Mobility status PT Visit Diagnosis: Muscle weakness (generalized) (M62.81);Difficulty in walking, not elsewhere classified (R26.2)    Time: [id number] PT Time Calculation (min) (ACUTE ONLY): 25 min   Charges:   PT Evaluation $PT Eval Moderate Complexity: 1 Mod PT Treatments $Therapeutic Activity: 8-22 mins   PT G Codes:   PT G-Codes **NOT FOR INPATIENT CLASS** Functional Assessment Tool Used: AM-PAC 6 Clicks Basic Mobility Functional Limitation: Mobility: Walking and moving around Mobility: Walking and Moving Around Current Status ([id number]): At least 1 percent but less than 20 percent impaired, limited or restricted Mobility: Walking and Moving Around Goal Status [phone]): At least 1 percent but less than 20 percent impaired, limited or restricted   Alva GarnetPatrick Clarrisa Kaylor PT, DPT, CSCS     04/11/2017, 11:42 PM

## 2017-04-11 NOTE — Progress Notes (Signed)
[name]  ROUNDING NOTE   Subjective:   Patient is doing fair today.  Reports generalized weakness.  Requesting physical therapy evaluation Denies any shortness of breath  Objective:  Vital signs in last 24 hours:  Temp:  [97.7 F (36.5 C)-98.8 F (37.1 C)] 98.8 F (37.1 C) (07/30 0326) Pulse Rate:  [63-79] 79 (07/30 1343) Resp:  [12-20] 16 (07/30 0850) BP: (166-190)/(65-77) 189/68 (07/30 1343) SpO2:  [94 %-100 %] 98 % (07/30 0854)  Weight change:  Filed Weights   04/09/17 1150 04/09/17 2100 04/10/17 0657  Weight: 75.6 kg (166 lb 9.6 oz) 70.7 kg (155 lb 13.8 oz) 70.4 kg (155 lb 3.2 oz)    Intake/Output: I/O last 3 completed shifts: In: 903 [P.O.:900; I.V.:3] Out: 1200 [Urine:200; Other:1000]   Intake/Output this shift:  Total I/O In: 600 [P.O.:600] Out: -   Physical Exam: General: cachectic  Head: Poor dentition. Dry oral mucosal membranes  Eyes: Anicteric,  Neck: Supple,   Lungs:  Clear to auscultation  Heart: Regular, no rubs or gallop  Abdomen:  Soft, nontender  Extremities: trace peripheral edema.  Neurologic: Nonfocal, moving all four extremities  Skin: No lesions  Access: Left AVF    Basic Metabolic Panel:  Recent Labs Lab 04/09/17 1247 04/10/17 0434 04/11/17 0444  NA 139 138 138  K 4.4 3.8 4.3  CL 107 103 104  CO2 21* 28 28  GLUCOSE 86 78 75  BUN 84* 38* 47*  CREATININE 8.24* 4.47* 5.56*  CALCIUM 9.3 8.5* 9.1  MG 2.2 2.0  --   PHOS 4.5 2.8  --     Liver Function Tests:  Recent Labs Lab 04/09/17 1247  AST 12*  ALT 9*  ALKPHOS 86  BILITOT 0.7  PROT 7.4  ALBUMIN 3.9    Recent Labs Lab 04/09/17 1247  LIPASE 39   No results for input(s): AMMONIA in the last 168 hours.  CBC:  Recent Labs Lab 04/09/17 1247 04/11/17 0444  WBC 5.0 5.2  HGB 10.8* 9.7*  HCT 32.9* 29.5*  MCV 89.6 88.4  PLT 168 166    Cardiac Enzymes:  Recent Labs Lab 04/09/17 1246 04/09/17 2100  TROPONINI 0.05* 0.05*     BNP: Invalid input(s): POCBNP  CBG:  Recent Labs Lab 04/09/17 1805 04/09/17 2048  GLUCAP 82 74    Microbiology: Results for orders placed or performed during the hospital encounter of 04/09/17  MRSA PCR Screening     Status: None   Collection Time: 04/09/17  8:48 PM  Result Value Ref Range Status   MRSA by PCR NEGATIVE NEGATIVE Final    Comment:        The GeneXpert MRSA Assay (FDA approved for NASAL specimens only), is one component of a comprehensive MRSA colonization surveillance program. It is not intended to diagnose MRSA infection nor to guide or monitor treatment for MRSA infections.     Coagulation Studies:  Recent Labs  04/09/17 1246  LABPROT 13.9  INR 1.07    Urinalysis: No results for input(s): COLORURINE, LABSPEC, PHURINE, GLUCOSEU, HGBUR, BILIRUBINUR, KETONESUR, PROTEINUR, UROBILINOGEN, NITRITE, LEUKOCYTESUR in the last 72 hours.  Invalid input(s): APPERANCEUR    Imaging: No results found.   Medications:   . sodium chloride     . amLODipine  10 mg Oral Daily  . aspirin EC  81 mg Oral q morning - 10a  . calcium acetate  1,334 mg Oral TID WC  . heparin  5,000 Units Subcutaneous Q8H  . hydrALAZINE  100  mg Oral TID  . losartan  50 mg Oral QHS  . nicotine  21 mg Transdermal Daily  . sodium chloride flush  3 mL Intravenous Q12H  . torsemide  20 mg Oral Daily   sodium chloride, acetaminophen **OR** acetaminophen, albuterol, bisacodyl, hydrALAZINE, ondansetron **OR** ondansetron (ZOFRAN) IV, senna-docusate, sodium chloride flush  Assessment/ Plan:  [name] is a 83 y.o. black male hypertension, gout, anemia, depression, COPD and tobacco abuse admitted on 04/09/2017 for Heart block [I45.9] Dialysis complication [T82.9XXA]   [facility]   1. [facility]. Missed one week of hemodialysis. [place] on presentation - Resumed dialysis 7/28 - resume TTS schedule.  Will get dialysis scheduled for Tuesday - his reasoning for missing dialysis is generalized weakness and that he feels so bad that he does not want to get up and go for dialysis - PT evaluation  2. Anemia of chronic kidney disease: with iron deficiency. Hemoglobin 9.7 - EPO with HD  3. Secondary hyperparathyroidism: calcium and phosphorus at goal.  - monitor phos level - Calcium acetate with meals.   4. Hypertension:  . Currently on amlodipine, hydralazine.  Losartan was added; but increase the dose to 100 mg   LOS: 2 Eric Yu 7/30/20182:55 PM

## 2017-04-11 NOTE — Progress Notes (Signed)
[facility] - [place] [facility]   PATIENT NAME: Eric Yu    MR#:  [id number]  DATE OF BIRTH:  1934-02-14  SUBJECTIVE:   Patient's blood pressure is somewhat uncontrolled, but he is asymptomatic. Still having periods of bradycardia with heart rates dropping the 30s when he sleeps. No plan for hemodialysis today.  REVIEW OF SYSTEMS:    Review of Systems  Constitutional: Negative for chills and fever.  HENT: Negative for congestion and tinnitus.   Eyes: Negative for blurred vision and double vision.  Respiratory: Negative for cough, shortness of breath and wheezing.   Cardiovascular: Negative for chest pain, orthopnea and PND.  Gastrointestinal: Negative for abdominal pain, diarrhea, nausea and vomiting.  Genitourinary: Negative for dysuria and hematuria.  Neurological: Positive for weakness. Negative for dizziness, sensory change and focal weakness.  All other systems reviewed and are negative.   Nutrition: Renal Tolerating Diet: Yes Tolerating PT: Await Eval.   DRUG ALLERGIES:   Allergies  Allergen Reactions  . Cephalexin     Other reaction(s): Other (See Comments) Gross hematuria - occurred once. Not certain whether it was coincidence or causative.    VITALS:  Blood pressure (!) 189/68, pulse 79, temperature 98.8 F (37.1 C), temperature source Oral, resp. rate 16, height 5\' 8"  (1.727 m), weight 70.4 kg (155 lb 3.2 oz), SpO2 98 %.  PHYSICAL EXAMINATION:   Physical Exam  GENERAL:  83 y.o.-year-old patient lying in bed in no acute distress.  EYES: Pupils equal, round, reactive to light and accommodation. No scleral icterus. Extraocular muscles intact.  HEENT: Head atraumatic, normocephalic. Oropharynx and nasopharynx clear.  NECK:  Supple, no jugular venous distention. No thyroid enlargement, no tenderness.  LUNGS: Normal breath sounds bilaterally, no wheezing, rales, rhonchi. No use of accessory muscles of respiration.  CARDIOVASCULAR: S1, S2  normal. No murmurs, rubs, or gallops.  ABDOMEN: Soft, nontender, nondistended. Bowel sounds present. No organomegaly or mass.  EXTREMITIES: No cyanosis, clubbing or edema b/l.    NEUROLOGIC: Cranial nerves II through XII are intact. No focal Motor or sensory deficits b/l.  Globally weak PSYCHIATRIC: The patient is alert and oriented x 3.  SKIN: No obvious rash, lesion, or ulcer.   Left upper extremity AV fistula with good bruit and thrill.  LABORATORY PANEL:   CBC  Recent Labs Lab 04/11/17 0444  WBC 5.2  HGB 9.7*  HCT 29.5*  PLT 166   ------------------------------------------------------------------------------------------------------------------  Chemistries   Recent Labs Lab 04/09/17 1247 04/10/17 0434 04/11/17 0444  NA 139 138 138  K 4.4 3.8 4.3  CL 107 103 104  CO2 21* 28 28  GLUCOSE 86 78 75  BUN 84* 38* 47*  CREATININE 8.24* 4.47* 5.56*  CALCIUM 9.3 8.5* 9.1  MG 2.2 2.0  --   AST 12*  --   --   ALT 9*  --   --   ALKPHOS 86  --   --   BILITOT 0.7  --   --    ------------------------------------------------------------------------------------------------------------------  Cardiac Enzymes  Recent Labs Lab 04/09/17 2100  TROPONINI 0.05*   ------------------------------------------------------------------------------------------------------------------  RADIOLOGY:  No results found.   ASSESSMENT AND PLAN:   83 year old male with past medical history of end-stage renal disease on hemodialysis, hypertension, depression, COPD, history of gout, chronic anemia who presented to the hospital due to fatigue, weakness and noted to be in complete heart block after having missed his hemodialysis for 2 sessions.  1. Complete heart block-this was secondary to patient's missing his  hemodialysis although he was not hyperkalemic or have any other electrolyte abnormalities.   - [facility] but HR have improved and transferred out.  - seen  by Cardiology no plans for pacemaker.  Improved w/ HD.   --Electrolytes are stable and no plans for pacemaker placement as per cardiology.  2. ESRD on HD - no acute need for HD now.  - nephro following and will get dialysis in a.m. plan for hemodialysis for tomorrow as he is on a Tuesday Thursday Saturday schedule.  3. Accelerated HTN - patient's blood pressure is somewhat uncontrolled today. Continue hydralazine but I will increase dose, continue Norvasc, add losartan. Continue IV as needed hydralazine. Follow hemodynamics.   4. Secondary hyperparathyroidism-continue PhosLo.  5. Tobacco abuse-continue nicotine patch.  Await Palliative Care consult to discuss goals of care as pt. Keeps missing his HD.   Await physical therapy, occupational therapy evaluation.  All the records are reviewed and case discussed with Care Management/Social Worker. Management plans discussed with the patient, family and they are in agreement.  CODE STATUS: DNR  DVT Prophylaxis: Hep. SQ  TOTAL TIME TAKING CARE OF THIS PATIENT: 30 minutes.   POSSIBLE D/C IN 1-2 DAYS, DEPENDING ON CLINICAL CONDITION.   Houston SirenSAINANI,[name] M.D on 04/11/2017 at 3:33 PM  Between 7am to 6pm - Pager - [phone]  After 6pm go to [email or web address] - Scientist, research (life sciences)password [email or web address]  [facility]  Hospitalists  Office  [phone]  CC: Primary care physician; Eric Yu

## 2017-04-11 NOTE — Progress Notes (Signed)
Patient remaining persistently hypertensive despite PO antihypertensives and PRN IV hydralazine. MD notified. Order to give extra dose of 50 mg hydralazine PO and dose of 50 mg losartan now. Hydralazine increased to 100 mg tid. Will continue to assess and monitor.

## 2017-04-11 NOTE — Progress Notes (Signed)
OT Cancellation Note  Patient Details Name: Eric Yu MRN: [id number] DOB: Jan 17, 1934   Cancelled Treatment:    Reason Eval/Treat Not Completed: Patient declined, no reason specified. Order received, chart reviewed. Pt sitting EOB eating lunch upon entry into room. Pt requesting OT come back after he has finished lunch. Will re-attempt OT evaluation at later time as pt is willing/able to participate.   Richrd PrimeJamie Stiller, MPH, MS, OTR/L ascom [phone] 04/11/17, 1:36 PM

## 2017-04-11 NOTE — Care Management (Addendum)
CM consult for possible placement. Patient was admitted to icu stepdown due to concerns for complete heart block. No immediate plans for PPM as the AV disassociation may be related to missing dialysis treatments. Transferred out of icu within last 24 hours.  Requested consults for PT and OT.  [facility]  Patient presents from home and it is reported that patient's sister has expressed concern that patient should not be living alone. Patient says "I have some people seeing me at home but I do not know the name.  My sister knows.  Patient acknowledges he has missed some dialysis treatments because "If I go and I feel bad they send me to the hospital." His sister transport to dialysis. Gave CM permission to speak with his sister Eric Yu.  Left message.  Notified Dimas ChyleAmanda Morris with [facility] [employer] of admission. patient says he would be in agreement to go to skilled nursing if it is recommended

## 2017-04-11 NOTE — Progress Notes (Signed)
Initial Nutrition Assessment  DOCUMENTATION CODES:   Not applicable  INTERVENTION:  Nepro Shake po BID, each supplement provides 425 kcal and 19 grams protein Monitor for needs  NUTRITION DIAGNOSIS:   Increased nutrient needs related to chronic illness (ESRD on HD) as evidenced by estimated needs.  GOAL:   Patient will meet greater than or equal to 90% of their needs  MONITOR:   Supplement acceptance, I & O's, Labs, PO intake, Weight trends  REASON FOR ASSESSMENT:   Malnutrition Screening Tool    ASSESSMENT:   [name] has a h/o HTN, ESRD on HD, COPD, Anemia, presents having missed HD three times due to feeling sick and gen weakness. Had abd pain a few days ago which has since resolved. Presented with complete heart block, hypertensive crisis.  Spoke with patient at bedside. He reports having a poor appetite but per dietary recall he eats well. Normally has 2 eggs, grits, toast for breakfast, a sandwich for lunch, and a cooked meal for dinner often consisting of something like creamed potatoes, roast, vegetables. Normally drinks NePro, gets them from his dialysis center. He reports being 180-190 lbs 1 year ago, states he has lost significant weight, but unable to corroborate in chart.  He reports issues with chewing/swallowing related to dentures that are too big and dry/tough meats. Encouraged patient to continue to order meals with gravy/sauce.  Intake/Output Summary (Last 24 hours) at 04/11/17 1506 Last data filed at 04/11/17 1352  Gross per 24 hour  Intake              600 ml  Output                0 ml  Net              600 ml   Nutrition-Focused physical exam completed. Findings are severe fat depletion at arms, moderate-severe muscle depletion at clavicle, clavicle/acromion, and moderate edema.  Unable to diagnosis malnutrition at this time.  Meal Completion: 90-100% Medications reviewed and include:  Phoslo 1334mg  TID Labs reviewed  Diet Order:  Diet renal  with fluid restriction Fluid restriction: 1200 mL Fluid; Room service appropriate? Yes; Fluid consistency: Thin  Skin:  Reviewed, no issues  Last BM:  04/08/2017  Height:   Ht Readings from Last 1 Encounters:  04/09/17 5\' 8"  (1.727 m)    Weight:   Wt Readings from Last 1 Encounters:  04/10/17 155 lb 3.2 oz (70.4 kg)    Ideal Body Weight:  70 kg  BMI:  Body mass index is 23.6 kg/m.  Estimated Nutritional Needs:   Kcal:  2160-2520 calories (ABW x30-35 calories)  Protein:  85-100 grams (1.2-1.4g/kg)  Fluid:  UOP +1L  EDUCATION NEEDS:   No education needs identified at this time  Eric AnoWilliam M. Elisah Parmer, MS, RD LDN Inpatient Clinical Dietitian Pager [phone]

## 2017-04-11 NOTE — Progress Notes (Signed)
Chaplain made a follow up visit with pt, but pt was not in the Rm. Nurse states that pt is having dialysis. CH informed nurse that he will follow up with this pt as needed to educate him on advanced directives to complete the consult for the pt.    04/11/17 1800  Clinical Encounter Type  Visited With Patient;Health care provider  Visit Type Follow-up;Other (Comment)  Referral From Chaplain  Consult/Referral To Chaplain  Spiritual Encounters  Spiritual Needs Other (Comment)

## 2017-04-11 NOTE — Progress Notes (Signed)
  End of hd 

## 2017-04-11 NOTE — Progress Notes (Signed)
Pt. Had 8 beat run v-tach, pt. Resting quietly in bed with no c/o or signs of Pain, distress, SOB observed. Prime notified.

## 2017-04-11 NOTE — Progress Notes (Signed)
Post hd assessment 

## 2017-04-11 NOTE — Consult Note (Signed)
Consultation Note Date: 04/11/17  Patient Name: Eric Yu  DOB: 28-Dec-1933  MRN: [id number]  Age / Sex: 84 y.o., male  PCP: Kirk Ruths, MD Referring Physician: Henreitta Leber, MD  Reason for Consultation: Establishing goals of care  HPI/Patient Profile: 83 y.o. male  with past medical history of ESRD on HD, HTN, COPD, gout, depression, and iron deficiency anemia admitted on 04/09/2017 with weakness and abdominal pain. Patient skipped hemodialysis twice last week due to generalized weakness. In ED, EKG revealed complete heart block. Evaluated by cardiology. Electrolytes are stable. No plans for pacemaker. Nephrology following. Hemodialysis TTS. Palliative medicine consultation for goals of care.   Clinical Assessment and Goals of Care: I have reviewed medical records, discussed with care team, and met with patient at bedside to discuss diagnosis, GOC, EOL wishes, disposition and options.   Introduced Palliative Medicine as specialized medical care for people living with serious illness. It focuses on providing relief from the symptoms and stress of a serious illness. The goal is to improve quality of life for both the patient and the family.  We discussed a brief life review of the patient. Widowed. No children. Worked in [employer]. Enjoys fishing. Prior to hospitalization, living independently. Ambulates with cane or walker. Reports decreased appetite because of dentures. He started hemodialysis about one year ago. He has "skipped a few times" when he doesn't feel good.    Discussed hospital diagnoses and interventions.  Advanced directives, concepts specific to code status, and artifical feeding and hydration were considered and discussed. No documented HCPOA or living will. He tells me he would like his sister, Cleda Mccreedy, to be his Media planner. He confirms DNR/DNI. He states "I want to live as long  as I can" but also clearly speaks of not wanting his life prolonged by tubes/drains if he was critically ill. Encouraged patient to consider completing advanced directive packet while hospitalized in order to document HCPOA and EOL wishes.   He wants to continue dialysis as long as possible. Explained the importance of him being compliant with sessions in order to minimize complications/hospitalizations.   Answered questions. Therapeutic listening and emotional support provided.    SUMMARY OF RECOMMENDATIONS    Patient confirms DNR  Encouraged patient to complete advanced directive packet/document HCPOA while hospitalized. Spiritual consult placed.   Continue dialysis as long as he can tolerate.   PMT will continue to support patient/family through hospitalization.   Code Status/Advance Care Planning:  DNR  Symptom Management:   Per attending  Palliative Prophylaxis:   Aspiration, Delirium Protocol, Oral Care and Turn Reposition  Additional Recommendations (Limitations, Scope, Preferences):  Full Scope Treatment-except DNR/DNI  Psycho-social/Spiritual:   Desire for further Chaplaincy support:no  Additional Recommendations: Caregiving  Support/Resources  Prognosis:   Unable to determine  Discharge Planning: To Be Determined likely SNF for rehab     Primary Diagnoses: Present on Admission: . Complete heart block ([place])   I have reviewed the medical record, interviewed the patient and family, and examined the patient. The  following aspects are pertinent.  Past Medical History:  Diagnosis Date  . Anemia   . Chronic kidney disease   . COPD (chronic obstructive pulmonary disease) ([place])   . Depression   . Gout   . Hypertension   . Iron deficiency    Social History   Social History  . Marital status: Widowed    Spouse name: N/A  . Number of children: N/A  . Years of education: N/A   Social History Main Topics  . Smoking status: Current Some Day Smoker    . Smokeless tobacco: Never Used     Comment: pt is currently using nicotine patches  . Alcohol use No  . Drug use: No  . Sexual activity: Not Currently   Other Topics Concern  . None   Social History Narrative  . None   Family History  Problem Relation Age of Onset  . Cancer Sister   . Hypertension Sister   . Urolithiasis Neg Hx   . Prostate cancer Neg Hx   . Kidney disease Neg Hx   . Kidney cancer Neg Hx    Scheduled Meds: . amLODipine  10 mg Oral Daily  . aspirin EC  81 mg Oral q morning - 10a  . calcium acetate  1,334 mg Oral TID WC  . heparin  5,000 Units Subcutaneous Q8H  . hydrALAZINE  100 mg Oral TID  . losartan  50 mg Oral QHS  . nicotine  21 mg Transdermal Daily  . sodium chloride flush  3 mL Intravenous Q12H  . torsemide  20 mg Oral Daily   Continuous Infusions: . sodium chloride     PRN Meds:.sodium chloride, acetaminophen **OR** acetaminophen, albuterol, bisacodyl, hydrALAZINE, ondansetron **OR** ondansetron (ZOFRAN) IV, senna-docusate, sodium chloride flush Medications Prior to Admission:  Prior to Admission medications   Medication Sig Start Date End Date Taking? Authorizing Provider  albuterol (PROVENTIL HFA;VENTOLIN HFA) 108 (90 Base) MCG/ACT inhaler Inhale 2 puffs into the lungs every morning.    Yes [provider]  amLODipine (NORVASC) 10 MG tablet Take 1 tablet (10 mg total) by mouth every morning. 02/24/17  Yes Wieting, Richard, MD  aspirin EC 81 MG tablet Take 81 mg by mouth every morning.    Yes [provider]  calcium acetate (PHOSLO) 667 MG capsule Take 1,334 mg by mouth 3 (three) times daily with meals. 09/17/16 09/17/17 Yes [provider]  cloNIDine (CATAPRES) 0.1 MG tablet Take 0.1 mg by mouth 2 (two) times daily.  07/05/16  Yes [provider]  hydrALAZINE (APRESOLINE) 25 MG tablet Take 1 tablet (25 mg total) by mouth 3 (three) times daily. 02/24/17  Yes Wieting, Richard, MD  lidocaine-prilocaine (EMLA) cream  Apply 1 application topically every dialysis. 12/30/16  Yes [provider]  senna-docusate (SENOKOT-S) 8.6-50 MG tablet Take 1 tablet by mouth at bedtime as needed for mild constipation. 07/30/16  Yes Gladstone Lighter, MD  torsemide (DEMADEX) 20 MG tablet Take 1 tablet (20 mg total) by mouth daily. 02/24/17  Yes Loletha Grayer, MD   Allergies  Allergen Reactions  . Cephalexin     Other reaction(s): Other (See Comments) Gross hematuria - occurred once. Not certain whether it was coincidence or causative.   Review of Systems  Constitutional: Positive for activity change, appetite change and fatigue.  Neurological: Positive for weakness.   Physical Exam  Constitutional: He is oriented to person, place, and time. He is cooperative.  HENT:  Head: Normocephalic and atraumatic.  Cardiovascular: Regular rhythm.  Pulmonary/Chest: Effort normal and breath sounds normal.  Abdominal: Normal appearance.  Neurological: He is alert and oriented to person, place, and time.  Skin: Skin is warm and dry.  Psychiatric: He has a normal mood and affect. His speech is normal and behavior is normal. Cognition and memory are normal.  Nursing note and vitals reviewed.  Vital Signs: BP (!) 189/68   Pulse 79   Temp 98.8 F (37.1 C) (Oral)   Resp 16   Ht _0  (1.727 m)   Wt 70.4 kg (155 lb 3.2 oz)   SpO2 98%   BMI 23.60 kg/m  Pain Assessment: No/denies pain POSS *See Group Information*: 1-Acceptable,Awake and alert Pain Score: 0-No pain  SpO2: SpO2: 98 % O2 Device:SpO2: 98 % O2 Flow Rate: .   IO: Intake/output summary:   Intake/Output Summary (Last 24 hours) at 04/11/17 1448 Last data filed at 04/11/17 1352  Gross per 24 hour  Intake              600 ml  Output                0 ml  Net              600 ml    LBM: Last BM Date: 04/08/17 Baseline Weight: Weight: 75.6 kg (166 lb 9.6 oz) Most recent weight: Weight: 70.4 kg (155 lb 3.2 oz)     Palliative Assessment/Data: PPS  50%   Flowsheet Rows     Most Recent Value  Intake Tab  Referral Department  Nephrology  Unit at Time of Referral  Cardiac/Telemetry Unit  Palliative Care Primary Diagnosis  Nephrology  Palliative Care Type  New Palliative care  Reason for referral  Clarify Goals of Care  Date first seen by Palliative Care  04/11/17  Clinical Assessment  Palliative Performance Scale Score  50%  Psychosocial & Spiritual Assessment  Palliative Care Outcomes  Patient/Family meeting held?  Yes  Who was at the meeting?  patient  Palliative Care Outcomes  Clarified goals of care, Provided psychosocial or spiritual support, ACP counseling assistance, Linked to palliative care logitudinal support      Time In: 1350 Time Out: 1500 Time Total: 56mn Greater than 50%  of this time was spent counseling and coordinating care related to the above assessment and plan.  Signed by:  MIhor Dow FNP-C Palliative Medicine Team  Phone: [phone]: [phone]  Please contact Palliative Medicine Team phone at [phone] questions and concerns.  For individual provider: See AShea Evans

## 2017-04-11 NOTE — Progress Notes (Signed)
Post hd vitals 

## 2017-04-11 NOTE — Progress Notes (Signed)
Pre hd info 

## 2017-04-11 NOTE — Care Management (Signed)
Contacted by [facility] and informed that patient is currently followed by [facility] physical therapy.  Patient declines home health nurse however.  Discussed the need to add SN to referral in addition to aide and social work.  Patient is "just not showing up for dialysis" and does not notify clinic nor does he respond when the clinic reaches out to him per [facility]

## 2017-04-12 ENCOUNTER — Inpatient Hospital Stay: Payer: Medicare HMO

## 2017-04-12 DIAGNOSIS — Z7189 Other specified counseling: Secondary | ICD-10-CM

## 2017-04-12 DIAGNOSIS — N186 End stage renal disease: Secondary | ICD-10-CM

## 2017-04-12 DIAGNOSIS — Z992 Dependence on renal dialysis: Secondary | ICD-10-CM

## 2017-04-12 LAB — HEPATITIS B SURFACE ANTIBODY,QUALITATIVE: HEP B S AB: NONREACTIVE

## 2017-04-12 LAB — CBC
HEMATOCRIT: 31.5 % — AB (ref 40.0–52.0)
HEMOGLOBIN: 10.5 g/dL — AB (ref 13.0–18.0)
MCH: 29.8 pg (ref 26.0–34.0)
MCHC: 33.2 g/dL (ref 32.0–36.0)
MCV: 89.7 fL (ref 80.0–100.0)
Platelets: 167 10*3/uL (ref 150–440)
RBC: 3.52 MIL/uL — AB (ref 4.40–5.90)
RDW: 13.8 % (ref 11.5–14.5)
WBC: 6.8 10*3/uL (ref 3.8–10.6)

## 2017-04-12 LAB — RENAL FUNCTION PANEL
ANION GAP: 10 (ref 5–15)
Albumin: 3.2 g/dL — ABNORMAL LOW (ref 3.5–5.0)
BUN: 22 mg/dL — ABNORMAL HIGH (ref 6–20)
CALCIUM: 8.9 mg/dL (ref 8.9–10.3)
CHLORIDE: 99 mmol/L — AB (ref 101–111)
CO2: 31 mmol/L (ref 22–32)
Creatinine, Ser: 3.68 mg/dL — ABNORMAL HIGH (ref 0.61–1.24)
GFR calc Af Amer: 17 mL/min — ABNORMAL LOW (ref >60)
GFR calc non Af Amer: 14 mL/min — ABNORMAL LOW (ref >60)
Glucose, Bld: 98 mg/dL (ref 65–99)
POTASSIUM: 3.6 mmol/L (ref 3.5–5.1)
Phosphorus: 2.2 mg/dL — ABNORMAL LOW (ref 2.5–4.6)
SODIUM: 140 mmol/L (ref 135–145)

## 2017-04-12 LAB — HEPATITIS B SURFACE ANTIGEN
HEP B S AG: NEGATIVE
Hepatitis B Surface Ag: NEGATIVE

## 2017-04-12 LAB — HEPATITIS B CORE ANTIBODY, TOTAL: Hep B Core Total Ab: POSITIVE — AB

## 2017-04-12 MED ORDER — LOSARTAN POTASSIUM 100 MG PO TABS: 100.0000 mg | ORAL_TABLET | Freq: Every day | ORAL | 1 refills | Status: AC

## 2017-04-12 MED ORDER — BISACODYL 10 MG RE SUPP
10.0000 mg | Freq: Once | RECTAL | Status: AC
  Administered 2017-04-12: 10 mg via RECTAL
  Filled 2017-04-12: qty 1

## 2017-04-12 MED ORDER — HYDRALAZINE HCL 100 MG PO TABS: 100.0000 mg | ORAL_TABLET | Freq: Three times a day (TID) | ORAL | 1 refills | Status: AC

## 2017-04-12 MED ORDER — DOCUSATE SODIUM 100 MG PO CAPS
100.0000 mg | ORAL_CAPSULE | Freq: Two times a day (BID) | ORAL | Status: DC
  Administered 2017-04-12 – 2017-04-13 (×2): 100 mg via ORAL
  Filled 2017-04-12 (×2): qty 1

## 2017-04-12 NOTE — Progress Notes (Signed)
PT Cancellation Note  Patient Details Name: Lorra HalsJohn Caraher MRN: [id number] DOB: 11/30/1933   Cancelled Treatment:    Reason Eval/Treat Not Completed: Patient at procedure or test/unavailable   Pt at dialysis this am on attempt.   Danielle DessSarah Micha Erck 04/12/2017, 1:42 PM

## 2017-04-12 NOTE — Progress Notes (Signed)
[name]  ROUNDING NOTE   Subjective:   Patient is doing fair today.  Reports generalized weakness.   HEMODIALYSIS FLOWSHEET:  Blood Flow Rate (mL/min): 400 mL/min Arterial Pressure (mmHg): -170 mmHg Venous Pressure (mmHg): 200 mmHg Transmembrane Pressure (mmHg): 70 mmHg Ultrafiltration Rate (mL/min): 570 mL/min Dialysate Flow Rate (mL/min): 600 ml/min Conductivity: Machine : 13.9 Conductivity: Machine : 13.9 Dialysis Fluid Bolus: Normal Saline Bolus Amount (mL): 250 mL Dialysate Change: 2K    Objective:  Vital signs in last 24 hours:  Temp:  [97.8 F (36.6 C)-98.8 F (37.1 C)] 97.9 F (36.6 C) (07/31 1330) Pulse Rate:  [50-103] 85 (07/31 1415) Resp:  [13-24] 17 (07/31 1330) BP: (141-184)/(62-81) 153/63 (07/31 1415) SpO2:  [94 %-100 %] 96 % (07/31 1330) Weight:  [69.8 kg (153 lb 14.1 oz)-71.8 kg (158 lb 4.6 oz)] 69.8 kg (153 lb 14.1 oz) (07/31 1010)  Weight change:  Filed Weights   04/10/17 0657 04/11/17 1610 04/12/17 1010  Weight: 70.4 kg (155 lb 3.2 oz) 71.8 kg (158 lb 4.6 oz) 69.8 kg (153 lb 14.1 oz)    Intake/Output: I/O last 3 completed shifts: In: 600 [P.O.:600] Out: 1300 [Other:1300]   Intake/Output this shift:  Total I/O In: 360 [P.O.:360] Out: 1300 [Emesis/NG output:300; Other:1000]  Physical Exam: General: cachectic  Head: Poor dentition. Dry oral mucosal membranes  Eyes: Anicteric,  Neck: Supple,   Lungs:  Clear to auscultation  Heart: Regular, no rubs or gallop  Abdomen:  Soft, nontender  Extremities: trace peripheral edema.  Neurologic: Nonfocal, moving all four extremities  Skin: No lesions  Access: Left AVF    Basic Metabolic Panel:  Recent Labs Lab 04/09/17 1247 04/10/17 0434 04/11/17 0444 04/12/17 1032  NA 139 138 138 140  K 4.4 3.8 4.3 3.6  CL 107 103 104 99*  CO2 21* 28 28 31   GLUCOSE 86 78 75 98  BUN 84* 38* 47* 22*  CREATININE 8.24* 4.47* 5.56* 3.68*  CALCIUM 9.3 8.5* 9.1 8.9  MG 2.2 2.0  --   --    PHOS 4.5 2.8  --  2.2*    Liver Function Tests:  Recent Labs Lab 04/09/17 1247 04/12/17 1032  AST 12*  --   ALT 9*  --   ALKPHOS 86  --   BILITOT 0.7  --   PROT 7.4  --   ALBUMIN 3.9 3.2*    Recent Labs Lab 04/09/17 1247  LIPASE 39   No results for input(s): AMMONIA in the last 168 hours.  CBC:  Recent Labs Lab 04/09/17 1247 04/11/17 0444 04/12/17 1032  WBC 5.0 5.2 6.8  HGB 10.8* 9.7* 10.5*  HCT 32.9* 29.5* 31.5*  MCV 89.6 88.4 89.7  PLT 168 166 167    Cardiac Enzymes:  Recent Labs Lab 04/09/17 1246 04/09/17 2100  TROPONINI 0.05* 0.05*    BNP: Invalid input(s): POCBNP  CBG:  Recent Labs Lab 04/09/17 1805 04/09/17 2048  GLUCAP 82 74    Microbiology: Results for orders placed or performed during the hospital encounter of 04/09/17  MRSA PCR Screening     Status: None   Collection Time: 04/09/17  8:48 PM  Result Value Ref Range Status   MRSA by PCR NEGATIVE NEGATIVE Final    Comment:        The GeneXpert MRSA Assay (FDA approved for NASAL specimens only), is one component of a comprehensive MRSA colonization surveillance program. It is not intended to diagnose MRSA infection nor to guide or monitor treatment  for MRSA infections.     Coagulation Studies: No results for input(s): LABPROT, INR in the last 72 hours.  Urinalysis: No results for input(s): COLORURINE, LABSPEC, PHURINE, GLUCOSEU, HGBUR, BILIRUBINUR, KETONESUR, PROTEINUR, UROBILINOGEN, NITRITE, LEUKOCYTESUR in the last 72 hours.  Invalid input(s): APPERANCEUR    Imaging: No results found.   Medications:   . sodium chloride     . amLODipine  10 mg Oral Daily  . aspirin EC  81 mg Oral q morning - 10a  . calcium acetate  1,334 mg Oral TID WC  . feeding supplement (NEPRO CARB STEADY)  237 mL Oral BID BM  . heparin  5,000 Units Subcutaneous Q8H  . hydrALAZINE  100 mg Oral TID  . losartan  100 mg Oral QHS  . nicotine  21 mg Transdermal Daily  . sodium chloride  flush  3 mL Intravenous Q12H  . torsemide  20 mg Oral Daily   sodium chloride, acetaminophen **OR** acetaminophen, albuterol, bisacodyl, hydrALAZINE, ondansetron **OR** ondansetron (ZOFRAN) IV, senna-docusate, sodium chloride flush  Assessment/ Plan:  [name] is a 83 y.o. black male hypertension, gout, anemia, depression, COPD and tobacco abuse admitted on 04/09/2017 for Heart block [I45.9] Dialysis complication [T82.9XXA]   [facility]   1. [facility]. Missed one week of hemodialysis. [place] on presentation - Resumed dialysis 7/28 - resume TTS schedule.   2. Anemia of chronic kidney disease: with iron deficiency. Hemoglobin 10.5 - EPO with HD  3. Secondary hyperparathyroidism: calcium and phosphorus at goal.  - monitor phos level - Calcium acetate with meals.   4. Hypertension:  . Currently on amlodipine, hydralazine, Losartan  BP control is better    LOS: 3 Eric Yu 7/31/20182:38 PM

## 2017-04-12 NOTE — Progress Notes (Signed)
Post hd assessment 

## 2017-04-12 NOTE — Progress Notes (Signed)
Post hd vitals 

## 2017-04-12 NOTE — Progress Notes (Signed)
   Hd start, pt alert, no c/o, stable 

## 2017-04-12 NOTE — Progress Notes (Signed)
Pre hd info 

## 2017-04-12 NOTE — Evaluation (Signed)
Occupational Therapy Evaluation Patient Details Name: Eric HalsJohn Mullinix MRN: [id number] DOB: 1934/05/11 Today's Date: 04/12/2017    History of Present Illness Patient is a pleasant 83 y/o male that presents with fatigue and abdominal pain, apparently he did not go to his dialysis appointments last week.    Clinical Impression   Pt seen for OT evaluation this date. Pt was independent, ambulating with SPC or RW at baseline, receiving HHPT services, and sister assisting with medication mgt, cooking, and cleaning. Pt independent with basic self care tasks, although he endorses becoming quickly fatigued. Pt presents with poor activity tolerance, requiring additional time/effort and supervision to perform basic self care tasks and supervision- min guard for functional mobility. Pt will benefit from skilled OT services to address noted impairments and functional deficits in order to maximize return to PLOF and minimize risk of further decline/need for increased assistance. Case manager notified of of home health OT recommendation upon discharge.    Follow Up Recommendations  Home health OT    Equipment Recommendations  Other (comment) (handheld shower head, grab bars )    Recommendations for Other Services       Precautions / Restrictions Precautions Precautions: Fall Restrictions Weight Bearing Restrictions: No      Mobility Bed Mobility Overal bed mobility: Needs Assistance Bed Mobility: Sit to Supine;Supine to Sit     Supine to sit: Supervision Sit to supine: Supervision   General bed mobility comments: no LOB  Transfers Overall transfer level: Needs assistance Equipment used: Rolling walker (2 wheeled) Transfers: Sit to/from Stand Sit to Stand: Min guard         General transfer comment: min guard and additional time to perform    Balance Overall balance assessment: Needs assistance Sitting-balance support: No upper extremity supported Sitting balance-Leahy Scale: Good      Standing balance support: Bilateral upper extremity supported Standing balance-Leahy Scale: Good                             ADL either performed or assessed with clinical judgement   ADL Overall ADL's : Needs assistance/impaired                                       General ADL Comments: pt generally at supervision level for ADL, with increased effort noted and easily fatigues. Educated in body positioning and use of AE for LB dressing to minimize SOB, pt verbalized understanding.     Vision Baseline Vision/History: Wears glasses Wears Glasses: Reading only Patient Visual Report: No change from baseline Vision Assessment?: No apparent visual deficits     Perception     Praxis      Pertinent Vitals/Pain Pain Assessment: No/denies pain     Hand Dominance Right   Extremity/Trunk Assessment Upper Extremity Assessment Upper Extremity Assessment: Overall WFL for tasks assessed   Lower Extremity Assessment Lower Extremity Assessment: Overall WFL for tasks assessed   Cervical / Trunk Assessment Cervical / Trunk Assessment: Normal   Communication Communication Communication: No difficulties   Cognition Arousal/Alertness: Awake/alert Behavior During Therapy: WFL for tasks assessed/performed Overall Cognitive Status: Within Functional Limits for tasks assessed                                     General Comments  Exercises Other Exercises Other Exercises: Pt educated in energy conservation strategies to support work simplification, home/routines modifications, falls prevention, and fatigue mgt. Pt verbalized understanding. Handout provided for reference. Of note, pt reports 4-5th grade education/literacy level, so education was provided verbally with visual demonstration.   Shoulder Instructions      Home Living Family/patient expects to be discharged to:: Private residence Living Arrangements: Alone Available Help  at Discharge: Family Type of Home: House Home Access: Ramped entrance     Home Layout: One level     Bathroom Shower/Tub: Chief Strategy OfficerTub/shower unit   Bathroom Toilet: Handicapped height     Home Equipment: Environmental consultantWalker - 2 wheels;Cane - single point;Shower seat;Adaptive equipment;Grab bars - tub/shower Adaptive Equipment: Reacher Additional Comments: pt notes that someone is supposed to install grab bars in the shower soon.      Prior Functioning/Environment Level of Independence: Needs assistance  Gait / Transfers Assistance Needed: Ind amb with RW in home and community (short distances) ADL's / Homemaking Assistance Needed: Pt's sister manages medications, meals, and cleaning for pt; pt reports taking bus to appointments (does not drive)   Comments: Pt has been using RW since recent hospital admission but using SPC prior to that.  Pt denies any falls in past 12 months.        OT Problem List: Decreased activity tolerance;Decreased knowledge of use of DME or AE      OT Treatment/Interventions: Self-care/ADL training;Therapeutic exercise;Therapeutic activities;Energy conservation;DME and/or AE instruction;Patient/family education    OT Goals(Current goals can be found in the care plan section) Acute Rehab OT Goals Patient Stated Goal: To return home safely OT Goal Formulation: With patient Time For Goal Achievement: 04/19/17 Potential to Achieve Goals: Good  OT Frequency: Min 1X/week   Barriers to D/C: Decreased caregiver support          Co-evaluation              AM-PAC PT "6 Clicks" Daily Activity     Outcome Measure Help from another person eating meals?: None Help from another person taking care of personal grooming?: None Help from another person toileting, which includes using toliet, bedpan, or urinal?: A Little Help from another person bathing (including washing, rinsing, drying)?: A Little Help from another person to put on and taking off regular upper body  clothing?: None Help from another person to put on and taking off regular lower body clothing?: A Little 6 Click Score: 21   End of Session    Activity Tolerance: Patient tolerated treatment well Patient left: in bed;with call bell/phone within reach;with bed alarm set  OT Visit Diagnosis: Other abnormalities of gait and mobility (R26.89)                Time: [id number] OT Time Calculation (min): 26 min Charges:  OT General Charges $OT Visit: 1 Procedure OT Evaluation $OT Eval Low Complexity: 1 Procedure OT Treatments $Self Care/Home Management : 8-22 mins G-Codes:     Richrd PrimeJamie Stiller, MPH, MS, OTR/L ascom [phone] 04/12/17, 10:09 AM

## 2017-04-12 NOTE — Progress Notes (Signed)
Pt. HR dropped into the 30's non-sustained multiple times during the night. Pt. In no distress, will continue to monitor

## 2017-04-12 NOTE — Progress Notes (Signed)
Pre hd assessment  

## 2017-04-12 NOTE — Clinical Social Work Note (Signed)
CSW was consulted for possible placement. PT/PT are recommending HHPT/OT. RNCM is following for needs. MD is aware. CSW is signing off as no further needs.   Dede QuerySarah Azaan Leask, MSW, LCSW  Clinical Social Worker  [phone]

## 2017-04-12 NOTE — Progress Notes (Signed)
[employer] - [place] at [facility]   PATIENT NAME: Eric HalsJohn Hodo    MR#:  [id number]  DATE OF BIRTH:  04/30/1934  SUBJECTIVE:   Seen at hemodialysis earlier today and was tolerating it well. Still has periods of bradycardia but quickly improves without any intervention. Patient complaining of some nausea dialysis.  REVIEW OF SYSTEMS:    Review of Systems  Constitutional: Negative for chills and fever.  HENT: Negative for congestion and tinnitus.   Eyes: Negative for blurred vision and double vision.  Respiratory: Negative for cough, shortness of breath and wheezing.   Cardiovascular: Negative for chest pain, orthopnea and PND.  Gastrointestinal: Positive for nausea and vomiting. Negative for abdominal pain and diarrhea.  Genitourinary: Negative for dysuria and hematuria.  Neurological: Positive for weakness. Negative for dizziness, sensory change and focal weakness.  All other systems reviewed and are negative.   Nutrition: Renal Tolerating Diet: Yes Tolerating PT: Evaluation noted  DRUG ALLERGIES:   Allergies  Allergen Reactions  . Cephalexin     Other reaction(s): Other (See Comments) Gross hematuria - occurred once. Not certain whether it was coincidence or causative.    VITALS:  Blood pressure (!) 153/63, pulse 85, temperature 97.9 F (36.6 C), temperature source Oral, resp. rate 17, height 5\' 8"  (1.727 m), weight 69.8 kg (153 lb 14.1 oz), SpO2 96 %.  PHYSICAL EXAMINATION:   Physical Exam  GENERAL:  83 y.o.-year-old patient lying in bed in no acute distress.  EYES: Pupils equal, round, reactive to light and accommodation. No scleral icterus. Extraocular muscles intact.  HEENT: Head atraumatic, normocephalic. Oropharynx and nasopharynx clear.  NECK:  Supple, no jugular venous distention. No thyroid enlargement, no tenderness.  LUNGS: Normal breath sounds bilaterally, no wheezing, rales, rhonchi. No use of accessory muscles of respiration.   CARDIOVASCULAR: S1, S2 normal. No murmurs, rubs, or gallops.  ABDOMEN: Soft, nontender, nondistended. Bowel sounds present. No organomegaly or mass.  EXTREMITIES: No cyanosis, clubbing or edema b/l.    NEUROLOGIC: Cranial nerves II through XII are intact. No focal Motor or sensory deficits b/l.  Globally weak PSYCHIATRIC: The patient is alert and oriented x 3.  SKIN: No obvious rash, lesion, or ulcer.   Left upper extremity AV fistula with good bruit and thrill.  LABORATORY PANEL:   CBC  Recent Labs Lab 04/12/17 1032  WBC 6.8  HGB 10.5*  HCT 31.5*  PLT 167   ------------------------------------------------------------------------------------------------------------------  Chemistries   Recent Labs Lab 04/09/17 1247 04/10/17 0434  04/12/17 1032  NA 139 138  < > 140  K 4.4 3.8  < > 3.6  CL 107 103  < > 99*  CO2 21* 28  < > 31  GLUCOSE 86 78  < > 98  BUN 84* 38*  < > 22*  CREATININE 8.24* 4.47*  < > 3.68*  CALCIUM 9.3 8.5*  < > 8.9  MG 2.2 2.0  --   --   AST 12*  --   --   --   ALT 9*  --   --   --   ALKPHOS 86  --   --   --   BILITOT 0.7  --   --   --   < > = values in this interval not displayed. ------------------------------------------------------------------------------------------------------------------  Cardiac Enzymes  Recent Labs Lab 04/09/17 2100  TROPONINI 0.05*   ------------------------------------------------------------------------------------------------------------------  RADIOLOGY:  Dg Abd 1 View  Result Date: 04/12/2017 CLINICAL DATA:  Nausea and vomiting when beginning dialysis today.  EXAM: ABDOMEN - 1 VIEW COMPARISON:  None. FINDINGS: There is mild gaseous distention of the stomach. The bowel gas pattern is nonobstructive. Prominent stool burden throughout the colon is most notable in the rectosigmoid. Atherosclerotic calcifications are identified. No acute bony abnormality. IMPRESSION: Nonobstructive bowel gas pattern. Mild gaseous  distention of the stomach. Prominent colonic stool burden most notable in the rectosigmoid. Atherosclerosis. Electronically Signed   By: Drusilla Kannerhomas  Dalessio M.D.   On: 04/12/2017 14:48     ASSESSMENT AND PLAN:   [age] year old male with past medical history of end-stage renal disease on hemodialysis, hypertension, depression, COPD, history of gout, chronic anemia who presented to the hospital due to fatigue, weakness and noted to be in complete heart block after having missed his hemodialysis for 2 sessions.  1. Complete heart block-this was secondary to patient's missing his hemodialysis. -His electrolytes were stable. - initially pt. [facility] have improved and currently Hemodynamically stable.  - seen by Cardiology no plans for pacemaker.  Improved w/ HD.   -- no further intervention as per Cardiology.   2. ESRD on HD - seen by nephrology and continue dialysis on a Tuesday Thursday Saturday schedule. Patient got hemodialysis today. -Appreciate nephrology input.  3. Accelerated HTN - blood pressure has improved since yesterday. Continue increased dose hydralazine, losartan, Norvasc. Continue IV hydralazine as needed.   4. Persistent nausea/vomiting-etiology unclear. Abd. X-ray showing stool in the recto-sigmoid area.  - will give Dulcolax suppository, follow response. Continue supportive care with antibiotics.  5. Secondary hyperparathyroidism-continue PhosLo.  6. Tobacco abuse-continue nicotine patch.  Appreciate palliative care input and patient remains DO NOT RESUSCITATE. Value by physical therapy and recommended home health services. Possible discharge today if nausea and vomiting has improved if not continue supportive care and will likely discharge tomorrow.  All the records are reviewed and case discussed with Care Management/Social Worker. Management plans discussed with the patient, family and they are in agreement.  CODE STATUS: DNR  DVT Prophylaxis: Hep.  SQ  TOTAL TIME TAKING CARE OF THIS PATIENT: 30 minutes.   POSSIBLE D/C IN 1-2 DAYS, DEPENDING ON CLINICAL CONDITION.   Houston SirenSAINANI,Galan Ghee J M.D on 04/12/2017 at 3:10 PM  Between 7am to 6pm - Pager - [phone]  After 6pm go to [email or web address] - Scientist, research (life sciences)password EPAS [facility]  [facility] [place] [facility]  Office  [phone]  CC: Primary care physician; Lauro RegulusAnderson, Marshall W, MD

## 2017-04-12 NOTE — Care Management (Signed)
Patient returned to unit from dialysis.  He became nauseated and vomited approximately 300 + cc of emesis.  Patient say he does not usually get sick with his dialysis.  He received IV Zofran.  Patient verbalizes he does not feel he should discharge home then says if he disagrees with discharge, everyone will be mad at him.  CM discussed his rights in regards to discharge appeal and assured him there would be no repercussions.  KUB ordered and patient will be given pos and ambulated  and if without sx,  will pursue discharge.  If patient remains symptomatic, then discharge most likely will be canceled.  Explained to patient, primary nurse and attending that patient may appeal discharge.

## 2017-04-12 NOTE — Care Management (Addendum)
Spoke with attending who anticipates discharge home today.  have requested the following home health orders- [facility] PT OT Aide and SW.  CM spoke with patient's siter [name].  She will transport patient home.  Discussed compliance with dialysis and that home health services have been increased if patient will allow staff to visit.  [facility] will make initial visit 04/13/2017.  Discussed with [facility] the need to do everything possible to encourage compliance with dialysis treatments.  Patient does not go when the Zenaida Niecevan comes to pick him up "because I feel bad." Discussed that it is highly likely that missing the appointments could contribute to his feeling bad.Notified Dimas ChyleAmanda Morris with [facility] of discharge.

## 2017-04-12 NOTE — Progress Notes (Signed)
SUBJECTIVE: Patient is feeling much better  Vitals:   04/11/17 1956 04/11/17 2050 04/12/17 0548 04/12/17 0754  BP: (!) 176/73 (!) 177/78 (!) 158/62 (!) 157/71  Pulse: 74 (!) 103 66 (!) 50  Resp: 13 18 16 16   Temp:  98.1 F (36.7 C) 97.9 F (36.6 C) 98.8 F (37.1 C)  TempSrc:  Oral Oral Oral  SpO2: 95% 98% 95% 94%  Weight:      Height:        Intake/Output Summary (Last 24 hours) at 04/12/17 0831 Last data filed at 04/11/17 2300  Gross per 24 hour  Intake              600 ml  Output             1300 ml  Net             -700 ml    LABS: Basic Metabolic Panel:  Recent Labs  28/41/3207/28/18 1247 04/10/17 0434 04/11/17 0444  NA 139 138 138  K 4.4 3.8 4.3  CL 107 103 104  CO2 21* 28 28  GLUCOSE 86 78 75  BUN 84* 38* 47*  CREATININE 8.24* 4.47* 5.56*  CALCIUM 9.3 8.5* 9.1  MG 2.2 2.0  --   PHOS 4.5 2.8  --    Liver Function Tests:  Recent Labs  04/09/17 1247  AST 12*  ALT 9*  ALKPHOS 86  BILITOT 0.7  PROT 7.4  ALBUMIN 3.9    Recent Labs  04/09/17 1247  LIPASE 39   CBC:  Recent Labs  04/09/17 1247 04/11/17 0444  WBC 5.0 5.2  HGB 10.8* 9.7*  HCT 32.9* 29.5*  MCV 89.6 88.4  PLT 168 166   Cardiac Enzymes:  Recent Labs  04/09/17 1246 04/09/17 2100  TROPONINI 0.05* 0.05*   BNP: Invalid input(s): POCBNP D-Dimer: No results for input(s): DDIMER in the last 72 hours. Hemoglobin A1C: No results for input(s): HGBA1C in the last 72 hours. Fasting Lipid Panel: No results for input(s): CHOL, HDL, LDLCALC, TRIG, CHOLHDL, LDLDIRECT in the last 72 hours. Thyroid Function Tests: No results for input(s): TSH, T4TOTAL, T3FREE, THYROIDAB in the last 72 hours.  Invalid input(s): FREET3 Anemia Panel: No results for input(s): VITAMINB12, FOLATE, FERRITIN, TIBC, IRON, RETICCTPCT in the last 72 hours.   PHYSICAL EXAM General: Well developed, well nourished, in no acute distress HEENT:  Normocephalic and atramatic Neck:  No JVD.  Lungs: Clear bilaterally  to auscultation and percussion. Heart: HRRR . Normal S1 and S2 without gallops or murmurs.  Abdomen: Bowel sounds are positive, abdomen soft and non-tender  Msk:  Back normal, normal gait. Normal strength and tone for age. Extremities: No clubbing, cyanosis or edema.   Neuro: Alert and oriented X 3. Psych:  Good affect, responds appropriately  TELEMETRY:Sinus rhythm  ASSESSMENT AND PLAN: Status post AV block which has resolved and is feeling much better and blood pressure is well controlled. Continue current medications and follow-up upon discharge Thursday at 10:00 in my office.  Active Problems:   Complete heart block (HCC)   Hypertensive crisis   Acute pulmonary edema (HCC)    Eric BlackwaterKHAN,Corry Storie A, MD, [facility] 04/12/2017 8:31 AM

## 2017-04-12 NOTE — Progress Notes (Signed)
  End of hd 

## 2017-04-13 NOTE — Discharge Instructions (Signed)
Renal diet. Don't miss hemodialysis. Smoking cessation. HH

## 2017-04-13 NOTE — Progress Notes (Signed)
SUBJECTIVE: Patient denies any chest pain or shortness of breath   Vitals:   04/12/17 1415 04/12/17 2001 04/13/17 0631 04/13/17 0805  BP: (!) 153/63 (!) 145/60 (!) 145/64 (!) 142/53  Pulse: 85 71 83 70  Resp:  18 18 19   Temp:  98.7 F (37.1 C) 99.2 F (37.3 C) 99.4 F (37.4 C)  TempSrc:  Oral Oral Oral  SpO2:  99% 100% 95%  Weight:      Height:        Intake/Output Summary (Last 24 hours) at 04/13/17 1015 Last data filed at 04/13/17 0223  Gross per 24 hour  Intake              360 ml  Output             1300 ml  Net             -940 ml    LABS: Basic Metabolic Panel:  Recent Labs  40/98/1107/30/18 0444 04/12/17 1032  NA 138 140  K 4.3 3.6  CL 104 99*  CO2 28 31  GLUCOSE 75 98  BUN 47* 22*  CREATININE 5.56* 3.68*  CALCIUM 9.1 8.9  PHOS  --  2.2*   Liver Function Tests:  Recent Labs  04/12/17 1032  ALBUMIN 3.2*   No results for input(s): LIPASE, AMYLASE in the last 72 hours. CBC:  Recent Labs  04/11/17 0444 04/12/17 1032  WBC 5.2 6.8  HGB 9.7* 10.5*  HCT 29.5* 31.5*  MCV 88.4 89.7  PLT 166 167   Cardiac Enzymes: No results for input(s): CKTOTAL, CKMB, CKMBINDEX, TROPONINI in the last 72 hours. BNP: Invalid input(s): POCBNP D-Dimer: No results for input(s): DDIMER in the last 72 hours. Hemoglobin A1C: No results for input(s): HGBA1C in the last 72 hours. Fasting Lipid Panel: No results for input(s): CHOL, HDL, LDLCALC, TRIG, CHOLHDL, LDLDIRECT in the last 72 hours. Thyroid Function Tests: No results for input(s): TSH, T4TOTAL, T3FREE, THYROIDAB in the last 72 hours.  Invalid input(s): FREET3 Anemia Panel: No results for input(s): VITAMINB12, FOLATE, FERRITIN, TIBC, IRON, RETICCTPCT in the last 72 hours.   PHYSICAL EXAM General: Well developed, well nourished, in no acute distress HEENT:  Normocephalic and atramatic Neck:  No JVD.  Lungs: Clear bilaterally to auscultation and percussion. Heart: HRRR . Normal S1 and S2 without gallops or  murmurs.  Abdomen: Bowel sounds are positive, abdomen soft and non-tender  Msk:  Back normal, normal gait. Normal strength and tone for age. Extremities: No clubbing, cyanosis or edema.   Neuro: Alert and oriented X 3. Psych:  Good affect, responds appropriately  TELEMETRY: Sinus rhythm  ASSESSMENT AND PLAN: A-V dissociation secondary to noncompliance with dialysis for one week. After dialysis several time patient is feeling much better and remains in sinus rhythm with no evidence of the A-V dissociation. Advise discharged patient with follow-up Friday at 10:00 in my office.  Active Problems:   Complete heart block (HCC)   Hypertensive crisis   Acute pulmonary edema (HCC)   ESRD on hemodialysis (HCC)   Goals of care, counseling/discussion    Ellison Leisure A, MD, [facility]FACC 04/13/2017 10:15 AM

## 2017-04-13 NOTE — Progress Notes (Signed)
Discharge instructions along with home medication and follow up gone over with patient and sister. Both verbalized that they understood instructions. Printed rx given to sister. Iv removed x2 and telemetry removed. Patient to be discharged home on ra. No distress noted.

## 2017-04-14 NOTE — Discharge Summary (Signed)
[employer] [facility] [place] [facility]   PATIENT NAME: Eric HalsJohn Bently    MR#:  [id number]  DATE OF BIRTH:  11/25/1933  DATE OF ADMISSION:  04/09/2017   ADMITTING PHYSICIAN: Shaune PollackQing Jaslin Novitski, MD  DATE OF DISCHARGE: 04/13/2017  1:30 PM  PRIMARY CARE PHYSICIAN: Lauro RegulusAnderson, Marshall W, MD   ADMISSION DIAGNOSIS:  Heart block [I45.9] Dialysis complication [T82.9XXA] DISCHARGE DIAGNOSIS:  Active Problems:   Complete heart block (HCC)   Hypertensive crisis   Acute pulmonary edema (HCC)   ESRD on hemodialysis (HCC)   Goals of care, counseling/discussion  SECONDARY DIAGNOSIS:   Past Medical History:  Diagnosis Date  . Anemia   . Chronic kidney disease   . COPD (chronic obstructive pulmonary disease) (HCC)   . Depression   . Gout   . Hypertension   . Iron deficiency    HOSPITAL COURSE:  83 year old male with past medical history of end-stage renal disease on hemodialysis, hypertension, depression, COPD, history of gout, chronic anemia who presented to the hospital due to fatigue, weakness and noted to be in complete heart block after having missed his hemodialysis for 2 sessions.  1. Complete heart block-this was secondary to patient's missing his hemodialysis. -His electrolytes were stable. - initially pt. [facility] HR have improved and currently Hemodynamically stable.  - seen by Cardiology no plans for pacemaker.  Improved w/ HD.   -- no further intervention as per Cardiology.   2. ESRD on HD - seen by nephrology and continue dialysis on a Tuesday Thursday Saturday schedule.   3. Accelerated HTN - blood pressure is better controlled.  Increased dose hydralazine, losartan, Norvasc.  4. Persistent nausea/vomiting-etiology unclear. Abd. X-ray showing stool in the recto-sigmoid area. Improved.  Dulcolax suppository  5. Secondary hyperparathyroidism-continue PhosLo.  6. Tobacco abuse-continue nicotine patch.  Discussed with Dr. Welton FlakesKhan. DISCHARGE  CONDITIONS:  Stable, discharged to home with home health yesterday. CONSULTS OBTAINED:  Treatment Team:  Laurier NancyKhan, Shaukat A, MD Lamont DowdyKolluru, Sarath, MD DRUG ALLERGIES:   Allergies  Allergen Reactions  . Cephalexin     Other reaction(s): Other (See Comments) Gross hematuria - occurred once. Not certain whether it was coincidence or causative.   DISCHARGE MEDICATIONS:   Allergies as of 04/13/2017      Reactions   Cephalexin    Other reaction(s): Other (See Comments) Gross hematuria - occurred once. Not certain whether it was coincidence or causative.      Medication List    STOP taking these medications   cloNIDine 0.1 MG tablet Commonly known as:  CATAPRES     TAKE these medications   albuterol 108 (90 Base) MCG/ACT inhaler Commonly known as:  PROVENTIL HFA;VENTOLIN HFA Inhale 2 puffs into the lungs every morning.   amLODipine 10 MG tablet Commonly known as:  NORVASC Take 1 tablet (10 mg total) by mouth every morning.   aspirin EC 81 MG tablet Take 81 mg by mouth every morning.   calcium acetate 667 MG capsule Commonly known as:  PHOSLO Take 1,334 mg by mouth 3 (three) times daily with meals.   hydrALAZINE 100 MG tablet Commonly known as:  APRESOLINE Take 1 tablet (100 mg total) by mouth 3 (three) times daily. What changed:  medication strength  how much to take   lidocaine-prilocaine cream Commonly known as:  EMLA Apply 1 application topically every dialysis.   losartan 100 MG tablet Commonly known as:  COZAAR Take 1 tablet (100 mg total) by mouth at bedtime.  senna-docusate 8.6-50 MG tablet Commonly known as:  Senokot-S Take 1 tablet by mouth at bedtime as needed for mild constipation.   torsemide 20 MG tablet Commonly known as:  DEMADEX Take 1 tablet (20 mg total) by mouth daily.        DISCHARGE INSTRUCTIONS:  See AVS.  If you experience worsening of your admission symptoms, develop shortness of breath, life threatening emergency, suicidal or  homicidal thoughts you must seek medical attention immediately by calling 911 or calling your MD immediately  if symptoms less severe.  You Must read complete instructions/literature along with all the possible adverse reactions/side effects for all the Medicines you take and that have been prescribed to you. Take any new Medicines after you have completely understood and accpet all the possible adverse reactions/side effects.   Please note  You were cared for by a hospitalist during your hospital stay. If you have any questions about your discharge medications or the care you received while you were in the hospital after you are discharged, you can call the unit and asked to speak with the hospitalist on call if the hospitalist that took care of you is not available. Once you are discharged, your primary care physician will handle any further medical issues. Please note that NO REFILLS for any discharge medications will be authorized once you are discharged, as it is imperative that you return to your primary care physician (or establish a relationship with a primary care physician if you do not have one) for your aftercare needs so that they can reassess your need for medications and monitor your lab values.    On the day of Discharge:  VITAL SIGNS:  Blood pressure (!) 142/53, pulse 70, temperature 99.4 F (37.4 C), temperature source Oral, resp. rate 19, height 5\' 8"  (1.727 m), weight 153 lb 14.1 oz (69.8 kg), SpO2 95 %. PHYSICAL EXAMINATION:  GENERAL:  83 y.o.-year-old patient lying in the bed with no acute distress.  EYES: Pupils equal, round, reactive to light and accommodation. No scleral icterus. Extraocular muscles intact.  HEENT: Head atraumatic, normocephalic. Oropharynx and nasopharynx clear.  NECK:  Supple, no jugular venous distention. No thyroid enlargement, no tenderness.  LUNGS: Normal breath sounds bilaterally, no wheezing, rales,rhonchi or crepitation. No use of accessory  muscles of respiration.  CARDIOVASCULAR: S1, S2 normal. No murmurs, rubs, or gallops.  ABDOMEN: Soft, non-tender, non-distended. Bowel sounds present. No organomegaly or mass.  EXTREMITIES: No pedal edema, cyanosis, or clubbing.  NEUROLOGIC: Cranial nerves II through XII are intact. Muscle strength 5/5 in all extremities. Sensation intact. Gait not checked.  PSYCHIATRIC: The patient is alert and oriented x 3.  SKIN: No obvious rash, lesion, or ulcer.  DATA REVIEW:   CBC  Recent Labs Lab 04/12/17 1032  WBC 6.8  HGB 10.5*  HCT 31.5*  PLT 167    Chemistries   Recent Labs Lab 04/09/17 1247 04/10/17 0434  04/12/17 1032  NA 139 138  < > 140  K 4.4 3.8  < > 3.6  CL 107 103  < > 99*  CO2 21* 28  < > 31  GLUCOSE 86 78  < > 98  BUN 84* 38*  < > 22*  CREATININE 8.24* 4.47*  < > 3.68*  CALCIUM 9.3 8.5*  < > 8.9  MG 2.2 2.0  --   --   AST 12*  --   --   --   ALT 9*  --   --   --  ALKPHOS 86  --   --   --   BILITOT 0.7  --   --   --   < > = values in this interval not displayed.   Microbiology Results  Results for orders placed or performed during the hospital encounter of 04/09/17  MRSA PCR Screening     Status: None   Collection Time: 04/09/17  8:48 PM  Result Value Ref Range Status   MRSA by PCR NEGATIVE NEGATIVE Final    Comment:        The GeneXpert MRSA Assay (FDA approved for NASAL specimens only), is one component of a comprehensive MRSA colonization surveillance program. It is not intended to diagnose MRSA infection nor to guide or monitor treatment for MRSA infections.     RADIOLOGY:  No results found.   Management plans discussed with the patient, family and they are in agreement.  CODE STATUS: Prior   TOTAL TIME TAKING CARE OF THIS PATIENT: 33 minutes.    Shaune Pollack M.D on 04/14/2017 at 6:34 PM  Between 7am to 6pm - Pager - [phone]  After 6pm go to [email or web address] - Scientist, research (life sciences) [place] [facility]  Office   [phone]  CC: Primary care physician; Lauro Regulus, MD   Note: This dictation was prepared with Dragon dictation along with smaller phrase technology. Any transcriptional errors that result from this process are unintentional.

## 2017-05-14 ENCOUNTER — Emergency Department: Payer: Medicare HMO

## 2017-05-14 ENCOUNTER — Encounter: Payer: Self-pay | Admitting: Emergency Medicine

## 2017-05-14 ENCOUNTER — Inpatient Hospital Stay
Admission: EM | Admit: 2017-05-14 | Discharge: 2017-05-18 | DRG: 640 | Disposition: A | Discharge status: Skilled Nursing Facility | Payer: Medicare HMO | Attending: Internal Medicine | Admitting: Internal Medicine

## 2017-05-14 DIAGNOSIS — F17200 Nicotine dependence, unspecified, uncomplicated: Secondary | ICD-10-CM | POA: Diagnosis present

## 2017-05-14 DIAGNOSIS — E875 Hyperkalemia: Principal | ICD-10-CM

## 2017-05-14 DIAGNOSIS — R001 Bradycardia, unspecified: Secondary | ICD-10-CM

## 2017-05-14 DIAGNOSIS — J209 Acute bronchitis, unspecified: Secondary | ICD-10-CM | POA: Diagnosis present

## 2017-05-14 DIAGNOSIS — Z8249 Family history of ischemic heart disease and other diseases of the circulatory system: Secondary | ICD-10-CM | POA: Diagnosis not present

## 2017-05-14 DIAGNOSIS — R531 Weakness: Secondary | ICD-10-CM | POA: Diagnosis present

## 2017-05-14 DIAGNOSIS — Z992 Dependence on renal dialysis: Secondary | ICD-10-CM

## 2017-05-14 DIAGNOSIS — N186 End stage renal disease: Secondary | ICD-10-CM | POA: Diagnosis present

## 2017-05-14 DIAGNOSIS — I120 Hypertensive chronic kidney disease with stage 5 chronic kidney disease or end stage renal disease: Secondary | ICD-10-CM | POA: Diagnosis present

## 2017-05-14 DIAGNOSIS — F329 Major depressive disorder, single episode, unspecified: Secondary | ICD-10-CM | POA: Diagnosis present

## 2017-05-14 DIAGNOSIS — Z881 Allergy status to other antibiotic agents status: Secondary | ICD-10-CM | POA: Diagnosis not present

## 2017-05-14 DIAGNOSIS — Z66 Do not resuscitate: Secondary | ICD-10-CM | POA: Diagnosis present

## 2017-05-14 DIAGNOSIS — D631 Anemia in chronic kidney disease: Secondary | ICD-10-CM | POA: Diagnosis present

## 2017-05-14 DIAGNOSIS — J440 Chronic obstructive pulmonary disease with acute lower respiratory infection: Secondary | ICD-10-CM | POA: Diagnosis present

## 2017-05-14 DIAGNOSIS — Z9115 Patient's noncompliance with renal dialysis: Secondary | ICD-10-CM | POA: Diagnosis not present

## 2017-05-14 DIAGNOSIS — R55 Syncope and collapse: Secondary | ICD-10-CM | POA: Diagnosis present

## 2017-05-14 DIAGNOSIS — Z7982 Long term (current) use of aspirin: Secondary | ICD-10-CM

## 2017-05-14 DIAGNOSIS — N2581 Secondary hyperparathyroidism of renal origin: Secondary | ICD-10-CM | POA: Diagnosis present

## 2017-05-14 DIAGNOSIS — M109 Gout, unspecified: Secondary | ICD-10-CM | POA: Diagnosis present

## 2017-05-14 DIAGNOSIS — Z79899 Other long term (current) drug therapy: Secondary | ICD-10-CM

## 2017-05-14 DIAGNOSIS — I509 Heart failure, unspecified: Secondary | ICD-10-CM

## 2017-05-14 DIAGNOSIS — J9601 Acute respiratory failure with hypoxia: Secondary | ICD-10-CM | POA: Diagnosis present

## 2017-05-14 LAB — COMPREHENSIVE METABOLIC PANEL
ALT: 8 U/L — AB (ref 17–63)
AST: 14 U/L — AB (ref 15–41)
Albumin: 3.6 g/dL (ref 3.5–5.0)
Alkaline Phosphatase: 76 U/L (ref 38–126)
Anion gap: 13 (ref 5–15)
BUN: 88 mg/dL — ABNORMAL HIGH (ref 6–20)
CHLORIDE: 107 mmol/L (ref 101–111)
CO2: 17 mmol/L — AB (ref 22–32)
Calcium: 9.3 mg/dL (ref 8.9–10.3)
Creatinine, Ser: 9.08 mg/dL — ABNORMAL HIGH (ref 0.61–1.24)
GFR calc Af Amer: 6 mL/min — ABNORMAL LOW (ref >60)
GFR, EST NON AFRICAN AMERICAN: 5 mL/min — AB (ref >60)
Glucose, Bld: 116 mg/dL — ABNORMAL HIGH (ref 65–99)
POTASSIUM: 5.4 mmol/L — AB (ref 3.5–5.1)
Sodium: 137 mmol/L (ref 135–145)
TOTAL PROTEIN: 7.1 g/dL (ref 6.5–8.1)
Total Bilirubin: 0.7 mg/dL (ref 0.3–1.2)

## 2017-05-14 LAB — CBC WITH DIFFERENTIAL/PLATELET
Basophils Absolute: 0.1 K/uL (ref 0–0.1)
Basophils Relative: 1 %
Eosinophils Absolute: 0 K/uL (ref 0–0.7)
Eosinophils Relative: 1 %
HCT: 26.9 % — ABNORMAL LOW (ref 40.0–52.0)
Hemoglobin: 8.9 g/dL — ABNORMAL LOW (ref 13.0–18.0)
Lymphocytes Relative: 18 %
Lymphs Abs: 1 K/uL (ref 1.0–3.6)
MCH: 29.3 pg (ref 26.0–34.0)
MCHC: 33.1 g/dL (ref 32.0–36.0)
MCV: 88.4 fL (ref 80.0–100.0)
Monocytes Absolute: 0.4 K/uL (ref 0.2–1.0)
Monocytes Relative: 8 %
Neutro Abs: 4 K/uL (ref 1.4–6.5)
Neutrophils Relative %: 72 %
Platelets: 202 K/uL (ref 150–440)
RBC: 3.04 MIL/uL — ABNORMAL LOW (ref 4.40–5.90)
RDW: 13.9 % (ref 11.5–14.5)
WBC: 5.5 K/uL (ref 3.8–10.6)

## 2017-05-14 LAB — TROPONIN I: Troponin I: 0.06 ng/mL

## 2017-05-14 LAB — PHOSPHORUS: PHOSPHORUS: 4.5 mg/dL (ref 2.5–4.6)

## 2017-05-14 LAB — MAGNESIUM: MAGNESIUM: 2.4 mg/dL (ref 1.7–2.4)

## 2017-05-14 LAB — GLUCOSE, CAPILLARY: GLUCOSE-CAPILLARY: 76 mg/dL (ref 65–99)

## 2017-05-14 MED ORDER — ONDANSETRON HCL 4 MG/2ML IJ SOLN
INTRAMUSCULAR | Status: AC
  Administered 2017-05-14: 4 mg via INTRAVENOUS
  Filled 2017-05-14: qty 2

## 2017-05-14 MED ORDER — BISACODYL 5 MG PO TBEC: 5.0000 mg | DELAYED_RELEASE_TABLET | Freq: Every day | ORAL | Status: DC | PRN

## 2017-05-14 MED ORDER — ACETAMINOPHEN 325 MG PO TABS: 650.0000 mg | ORAL_TABLET | Freq: Four times a day (QID) | ORAL | Status: DC | PRN

## 2017-05-14 MED ORDER — TRAZODONE HCL 50 MG PO TABS: 25.0000 mg | ORAL_TABLET | Freq: Every evening | ORAL | Status: DC | PRN

## 2017-05-14 MED ORDER — ASPIRIN EC 81 MG PO TBEC
81.0000 mg | DELAYED_RELEASE_TABLET | ORAL | Status: DC
  Administered 2017-05-15 – 2017-05-18 (×4): 81 mg via ORAL
  Filled 2017-05-14 (×5): qty 1

## 2017-05-14 MED ORDER — LOSARTAN POTASSIUM 50 MG PO TABS
100.0000 mg | ORAL_TABLET | Freq: Every day | ORAL | Status: DC
  Administered 2017-05-15 – 2017-05-17 (×4): 100 mg via ORAL
  Filled 2017-05-14 (×4): qty 2

## 2017-05-14 MED ORDER — HYDRALAZINE HCL 50 MG PO TABS
100.0000 mg | ORAL_TABLET | Freq: Three times a day (TID) | ORAL | Status: DC
  Administered 2017-05-15 – 2017-05-18 (×11): 100 mg via ORAL
  Filled 2017-05-14 (×11): qty 2

## 2017-05-14 MED ORDER — HYDROCODONE-ACETAMINOPHEN 5-325 MG PO TABS
1.0000 | ORAL_TABLET | ORAL | Status: DC | PRN
  Administered 2017-05-15: 2 via ORAL
  Filled 2017-05-14: qty 2

## 2017-05-14 MED ORDER — HEPARIN SODIUM (PORCINE) 1000 UNIT/ML DIALYSIS
1000.0000 [IU] | INTRAMUSCULAR | Status: DC | PRN
  Filled 2017-05-14: qty 1

## 2017-05-14 MED ORDER — SODIUM CHLORIDE 0.9 % IV SOLN: 100.0000 mL | INTRAVENOUS | Status: DC | PRN

## 2017-05-14 MED ORDER — ONDANSETRON HCL 4 MG/2ML IJ SOLN
4.0000 mg | Freq: Once | INTRAMUSCULAR | Status: AC
  Administered 2017-05-14: 4 mg via INTRAVENOUS

## 2017-05-14 MED ORDER — ONDANSETRON HCL 4 MG PO TABS: 4.0000 mg | ORAL_TABLET | Freq: Four times a day (QID) | ORAL | Status: DC | PRN

## 2017-05-14 MED ORDER — DOCUSATE SODIUM 100 MG PO CAPS
100.0000 mg | ORAL_CAPSULE | Freq: Two times a day (BID) | ORAL | Status: DC
  Administered 2017-05-15 – 2017-05-18 (×8): 100 mg via ORAL
  Filled 2017-05-14 (×8): qty 1

## 2017-05-14 MED ORDER — ALBUTEROL SULFATE (2.5 MG/3ML) 0.083% IN NEBU: 3.0000 mL | INHALATION_SOLUTION | RESPIRATORY_TRACT | Status: DC

## 2017-05-14 MED ORDER — ALTEPLASE 2 MG IJ SOLR: 2.0000 mg | Freq: Once | INTRAMUSCULAR | Status: DC | PRN

## 2017-05-14 MED ORDER — HEPARIN SODIUM (PORCINE) 5000 UNIT/ML IJ SOLN
5000.0000 [IU] | Freq: Three times a day (TID) | INTRAMUSCULAR | Status: DC
  Administered 2017-05-15 – 2017-05-18 (×10): 5000 [IU] via SUBCUTANEOUS
  Filled 2017-05-14 (×10): qty 1

## 2017-05-14 MED ORDER — ONDANSETRON HCL 4 MG/2ML IJ SOLN: 4.0000 mg | Freq: Four times a day (QID) | INTRAMUSCULAR | Status: DC | PRN

## 2017-05-14 MED ORDER — ALBUTEROL SULFATE (2.5 MG/3ML) 0.083% IN NEBU
3.0000 mL | INHALATION_SOLUTION | RESPIRATORY_TRACT | Status: DC
  Administered 2017-05-15 – 2017-05-17 (×3): 3 mL via RESPIRATORY_TRACT
  Filled 2017-05-14 (×3): qty 3

## 2017-05-14 MED ORDER — ACETAMINOPHEN 650 MG RE SUPP: 650.0000 mg | Freq: Four times a day (QID) | RECTAL | Status: DC | PRN

## 2017-05-14 NOTE — Progress Notes (Signed)
Pre dialysis assessment 

## 2017-05-14 NOTE — H&P (Signed)
[facility] -  at [facility]   PATIENT NAME: Eric Yu    MR#:  [id number]  DATE OF BIRTH:  1933/10/29  DATE OF ADMISSION:  05/14/2017  PRIMARY CARE PHYSICIAN: Lauro Regulus, MD   REQUESTING/REFERRING PHYSICIAN: Dr. Minna Antis  CHIEF COMPLAINT:generalized weakness   Chief Complaint  Patient presents with  . Weakness    HISTORY OF PRESENT ILLNESS:  Eric Yu  is a 83 y.o. male with a known history of ESRD on hemodialysis, last hemodialysis was last Saturday, patient could not go for dialysisthis week because he felt very weak and unable to get ready even though transport was available for the patient to take him for dialysis. Patient could not go for dialysis because he felt very weak. Today brought by his sister because of worsening weakness. Patient noted to have severe bradycardia with heart rate up to 30s. Patient received emergency hemodialysis. Patient is seen in the hemodialysis unit, heart rate went up to 60s within 20 minutes of starting him on dialysis. Patient complains of decreased appetite, nausea going on for 1 week. No shortness of breath or chest pain.  PAST MEDICAL HISTORY:   Past Medical History:  Diagnosis Date  . Anemia   . Chronic kidney disease   . COPD (chronic obstructive pulmonary disease) (HCC)   . Depression   . Gout   . Hypertension   . Iron deficiency     PAST SURGICAL HISTOIRY:   Past Surgical History:  Procedure Laterality Date  . AV FISTULA PLACEMENT Left 07/23/2016   Procedure: INSERTION OF ARTERIOVENOUS (AV) GORE-TEX GRAFT ARM;  Surgeon: Renford Dills, MD;  Location: [facility] ORS;  Service: Vascular;  Laterality: Left;  . NO PAST SURGERIES    . PERIPHERAL VASCULAR CATHETERIZATION N/A 04/26/2016   Procedure: Dialysis/Perma Catheter Insertion;  Surgeon: Annice Needy, MD;  Location: [facility] INVASIVE CV LAB;  Service: Cardiovascular;  Laterality: N/A;  . THYROIDECTOMY      SOCIAL HISTORY:   Social  History  Substance Use Topics  . Smoking status: Current Some Day Smoker  . Smokeless tobacco: Never Used     Comment: pt is currently using nicotine patches  . Alcohol use No    FAMILY HISTORY:   Family History  Problem Relation Age of Onset  . Cancer Sister   . Hypertension Sister   . Urolithiasis Neg Hx   . Prostate cancer Neg Hx   . Kidney disease Neg Hx   . Kidney cancer Neg Hx     DRUG ALLERGIES:   Allergies  Allergen Reactions  . Cephalexin     Other reaction(s): Other (See Comments) Gross hematuria - occurred once. Not certain whether it was coincidence or causative.    REVIEW OF SYSTEMS:  CONSTITUTIONAL: No fever, does have generalized weakness, fatigue.Marland Kitchen  EYES: No blurred or double vision.  EARS, NOSE, AND THROAT: No tinnitus or ear pain.  RESPIRATORY: No cough, shortness of breath, wheezing or hemoptysis.  CARDIOVASCULAR: No chest pain, orthopnea, edema.  GASTROINTESTINAL: has nausea but no vomiting, no diarrhea or abdominal pain.  GENITOURINARY: on dialysis. But still makes urine. ENDOCRINE: No polyuria, nocturia,  HEMATOLOGY: No anemia, easy bruising or bleeding SKIN: No rash or lesion. MUSCULOSKELETAL: No joint pain or arthritis.   NEUROLOGIC: No tingling, numbness, weakness.  PSYCHIATRY: No anxiety or depression.   MEDICATIONS AT HOME:   Prior to Admission medications   Medication Sig Start Date End Date Taking? Authorizing Provider  albuterol (PROVENTIL HFA;VENTOLIN HFA)  108 (90 Base) MCG/ACT inhaler Inhale 2 puffs into the lungs every morning.    Yes [provider]  amLODipine (NORVASC) 10 MG tablet Take 1 tablet (10 mg total) by mouth every morning. 02/24/17  Yes Wieting, Richard, MD  aspirin EC 81 MG tablet Take 81 mg by mouth every morning.    Yes [provider]  calcium acetate (PHOSLO) 667 MG capsule Take 667 mg by mouth 3 (three) times daily with meals.  09/17/16 09/17/17 Yes [provider]  hydrALAZINE (APRESOLINE)  100 MG tablet Take 1 tablet (100 mg total) by mouth 3 (three) times daily. 04/12/17  Yes Houston SirenSainani, Vivek J, MD  lidocaine-prilocaine (EMLA) cream Apply 1 application topically every dialysis. 12/30/16  Yes [provider]  losartan (COZAAR) 100 MG tablet Take 1 tablet (100 mg total) by mouth at bedtime. 04/12/17  Yes Sainani, Rolly PancakeVivek J, MD  senna-docusate (SENOKOT-S) 8.6-50 MG tablet Take 1 tablet by mouth at bedtime as needed for mild constipation. 07/30/16  Yes Enid BaasKalisetti, Radhika, MD  torsemide (DEMADEX) 20 MG tablet Take 1 tablet (20 mg total) by mouth daily. 02/24/17  Yes Wieting, Richard, MD      VITAL SIGNS:  Blood pressure (!) 155/60, pulse 66, temperature (!) 97.5 F (36.4 C), temperature source Axillary, resp. rate 12, height 5\' 8"  (1.727 m), weight 70.3 kg (155 lb), SpO2 97 %.  PHYSICAL EXAMINATION:  GENERAL:  83 y.o.-year-old patient lying in the bed,appears generally ill .EYES: Pupils equal, round, reactive to light and no scleral icterus. Extraocular muscles intact.  HEENT: Head atraumatic, normocephalic. Oropharynx and nasopharynx clear.  NECK:  Supple, no jugular venous distention. No thyroid enlargement, no tenderness.  LUNGS:diminished breath sounds bilaterally.no wheezing. CARDIOVASCULAR: S1, S2 normal. No murmurs, rubs, or gallops.  ABDOMEN: Soft, nontender, nondistended. Bowel sounds present. No organomegaly or mass.  EXTREMITIES: No pedal edema, cyanosis, or clubbing.  NEUROLOGIC: Cranial nerves II through XII are intact. Muscle strength 5/5 in all extremities. Sensation intact. Gait not checked.  PSYCHIATRIC: The patient is alert and oriented x 3.  SKIN: No obvious rash, lesion, or ulcer.   LABORATORY PANEL:   CBC  Recent Labs Lab 05/14/17 1629  WBC 5.5  HGB 8.9*  HCT 26.9*  PLT 202   ------------------------------------------------------------------------------------------------------------------  Chemistries   Recent Labs Lab 05/14/17 1629  NA 137   K 5.4*  CL 107  CO2 17*  GLUCOSE 116*  BUN 88*  CREATININE 9.08*  CALCIUM 9.3  MG 2.4  AST 14*  ALT 8*  ALKPHOS 76  BILITOT 0.7   ------------------------------------------------------------------------------------------------------------------  Cardiac Enzymes  Recent Labs Lab 05/14/17 1629  TROPONINI 0.06*   ------------------------------------------------------------------------------------------------------------------  RADIOLOGY:  Dg Chest 2 View  Result Date: 05/14/2017 CLINICAL DATA:  Cough for 1 week and weakness. EXAM: CHEST  2 VIEW COMPARISON:  04/09/2017 and prior radiographs FINDINGS: Cardiomegaly noted. Mild to moderate diffuse interstitial opacities are present. Small bilateral pleural effusions are noted. Bibasilar opacities are present -question atelectasis versus airspace disease. No pneumothorax or acute bony abnormality. IMPRESSION: Mild to moderate diffuse interstitial opacities -favor pulmonary edema over infection. Bibasilar opacities-favor focal edema or atelectasis over infection. Cardiomegaly and trace bilateral pleural effusions. Electronically Signed   By: Harmon PierJeffrey  Hu M.D.   On: 05/14/2017 17:17    EKG:   Orders placed or performed during the hospital encounter of 05/14/17  . EKG 12-Lead  . EKG 12-Lead  patient has sinus bradycardia 45 bpm with normal axis narrow QRS. EKG findings discussed with Dr. Juliann Paresallwood  by ER MD he suggested that patient has bradycardia secondary to his dialysis need.  IMPRESSION AND PLAN:  # Patient is ESRD patient is to hemodialysis sessions this week due to weakness and now he has generalized weakness, bradycardia and hyperkalemia and he is admitted to telemetry for and received emergency hemodialysis; #2 severe bradycardia secondary to hyperkalemia, missed hemodialysis sessions: Bradycardia improved, heart rate improved from 30s to 60s within 20 minutes of starting hemodialysis.  #3 hyperkalemia secondary to missed  hemodialysis sessions, potassium improved from 5.4-4.5.    4. generalized weakness; Continue physical therapy  #5 ESRD: Hemodialysis on Tuesday, Thursday, Saturday. Nephrology is consulted, received hemodialysis today. All the records are reviewed and case discussed with ED provider. Management plans discussed with the patient, family and they are in agreement.  CODE STATUS: full  TOTAL TIME TAKING CARE OF THIS PATIENT: 55 minutes.    Katha Hamming M.D on 05/14/2017 at 8:13 PM  Between 7am to 6pm - Pager - [phone]  After 6pm go to [email or web address] - password [email or web address] [facility]  [facility]  Office  [phone]  CC: Primary care physician; Lauro Regulus, MD  Note: This dictation was prepared with Dragon dictation along with smaller phrase technology. Any transcriptional errors that result from this process are unintentional.

## 2017-05-14 NOTE — ED Provider Notes (Signed)
[facility] Emergency Department Provider Note  Time seen: 4:29 PM  I have reviewed the triage vital signs and the nursing notes.   HISTORY  Chief Complaint Weakness    HPI Linkoln Alkire is a 83 y.o. male With a past medical history of COPD, hypertension, end-stage renal disease on hemodialysis, presents to the emergency department for generalized weakness. According to EMS report and the patient for the past one week he has not received dialysis due to generalized weakness being unable to get himself ready to go to dialysis. Patient states he has been feeling extremely weak, but denies most all other symptoms such as chest pain abdominal pain, nausea, vomiting, diarrhea, cough or congestion. Patient does state he continues to make urine every day.  Past Medical History:  Diagnosis Date  . Anemia   . Chronic kidney disease   . COPD (chronic obstructive pulmonary disease) (HCC)   . Depression   . Gout   . Hypertension   . Iron deficiency     Patient Active Problem List   Diagnosis Date Noted  . ESRD on hemodialysis (HCC)   . Goals of care, counseling/discussion   . Hypertensive crisis   . Acute pulmonary edema (HCC)   . Complete heart block (HCC) 04/09/2017  . Hyperkalemia 07/29/2016  . Physical debility   . Palliative care by specialist   . DNR (do not resuscitate)   . ESRD (end stage renal disease) on dialysis (HCC)   . Symptomatic anemia 05/08/2016  . Severe major depression, single episode (HCC) 04/28/2016  . Injury of kidney 01/08/2015  . Blood loss, postoperative 01/06/2015  . Hypertensive left ventricular hypertrophy 12/26/2014  . Gout 01/16/2014  . Benign hypertension 05/19/2012  . Current tobacco use 05/19/2012    Past Surgical History:  Procedure Laterality Date  . AV FISTULA PLACEMENT Left 07/23/2016   Procedure: INSERTION OF ARTERIOVENOUS (AV) GORE-TEX GRAFT ARM;  Surgeon: Renford Dills, MD;  Location: [facility] ORS;  Service:  Vascular;  Laterality: Left;  . NO PAST SURGERIES    . PERIPHERAL VASCULAR CATHETERIZATION N/A 04/26/2016   Procedure: Dialysis/Perma Catheter Insertion;  Surgeon: Annice Needy, MD;  Location: [facility] INVASIVE CV LAB;  Service: Cardiovascular;  Laterality: N/A;  . THYROIDECTOMY      Prior to Admission medications   Medication Sig Start Date End Date Taking? Authorizing Provider  albuterol (PROVENTIL HFA;VENTOLIN HFA) 108 (90 Base) MCG/ACT inhaler Inhale 2 puffs into the lungs every morning.     [provider]  amLODipine (NORVASC) 10 MG tablet Take 1 tablet (10 mg total) by mouth every morning. 02/24/17   Alford Highland, MD  aspirin EC 81 MG tablet Take 81 mg by mouth every morning.     [provider]  calcium acetate (PHOSLO) 667 MG capsule Take 1,334 mg by mouth 3 (three) times daily with meals. 09/17/16 09/17/17  [provider]  hydrALAZINE (APRESOLINE) 100 MG tablet Take 1 tablet (100 mg total) by mouth 3 (three) times daily. 04/12/17   Houston Siren, MD  lidocaine-prilocaine (EMLA) cream Apply 1 application topically every dialysis. 12/30/16   [provider]  losartan (COZAAR) 100 MG tablet Take 1 tablet (100 mg total) by mouth at bedtime. 04/12/17   Houston Siren, MD  senna-docusate (SENOKOT-S) 8.6-50 MG tablet Take 1 tablet by mouth at bedtime as needed for mild constipation. 07/30/16   Enid Baas, MD  torsemide (DEMADEX) 20 MG tablet Take 1 tablet (20 mg total) by mouth daily.  02/24/17   Alford Highland, MD    Allergies  Allergen Reactions  . Cephalexin     Other reaction(s): Other (See Comments) Gross hematuria - occurred once. Not certain whether it was coincidence or causative.    Family History  Problem Relation Age of Onset  . Cancer Sister   . Hypertension Sister   . Urolithiasis Neg Hx   . Prostate cancer Neg Hx   . Kidney disease Neg Hx   . Kidney cancer Neg Hx     Social History Social History  Substance Use Topics  .  Smoking status: Current Some Day Smoker  . Smokeless tobacco: Never Used     Comment: pt is currently using nicotine patches  . Alcohol use No    Review of Systems Constitutional: Negative for fever. Cardiovascular: Negative for chest pain. Respiratory: Negative for shortness of breath. Gastrointestinal: Negative for abdominal pain, vomiting and diarrhea. Genitourinary: makes urine daily, does not believe he is having any dysuria. Musculoskeletal: Negative for back pain. Neurological: Negative for headaches, focal weakness or numbness.positive for generalized weakness. All other ROS negative  ____________________________________________   PHYSICAL EXAM:  VITAL SIGNS: ED Triage Vitals  Enc Vitals Group     BP --      Pulse --      Resp --      Temp 05/14/17 1627 97.9 F (36.6 C)     Temp Source 05/14/17 1627 Oral     SpO2 --      Weight 05/14/17 1628 155 lb (70.3 kg)     Height 05/14/17 1628 5\' 8"  (1.727 m)     Head Circumference --      Peak Flow --      Pain Score --      Pain Loc --      Pain Edu? --      Excl. in GC? --     Constitutional: alert. Appears fatigue/week, but no distress. Does smell of urine. Eyes: Normal exam ENT   Head: Normocephalic and atraumatic.   Mouth/Throat: Mucous membranes are moist. Cardiovascular: Normal rate, regular rhythm.  Respiratory: Normal respiratory effort without tachypnea nor retractions. Breath sounds are clear  Gastrointestinal: Soft and nontender. No distention.  Musculoskeletal: Nontender with normal range of motion in all extremities.  Neurologic:  Normal speech and language. No gross focal neurologic deficits Skin:  Skin is warm, dry and intact.  Psychiatric: Mood and affect are normal.   ____________________________________________    EKG  EKG reviewed and interpreted by myself appears to show atrial fibrillation versus sinus bradycardia at 45 bpm, narrow QRS, normal axis, normal intervals with nonspecific  ST changes possible U waves.  EKG reviewed and interpreted by myself shows atrial fibrillation at 37 bpm with a narrow QRS, normal axis, nonspecific ST changes.  ____________________________________________    RADIOLOGY  mild to moderate interstitial edema.  ____________________________________________   INITIAL IMPRESSION / ASSESSMENT AND PLAN / ED COURSE  Pertinent labs & imaging results that were available during my care of the patient were reviewed by me and considered in my medical decision making (see chart for details).  patient presents to the emergency department for generalized fatigue/weakness. Patient has not had dialysis in 7 days. Largely negative review of systems. We will check labs including urinalysis, chest x-ray and continue to close monitoring the emergency department.  patient's labs have resulted with a hemoglobin of 8.9, potassium of 5.4. Patient's troponin is elevated at 0.06 which is likely due to renal failure. Chest  x-rayshows mild to moderate interstitial opacities likely edema. We will discuss the patient with nephrology to help arrange for dialysis.  I discussed the patient with nephrology who will arrange for dialysis. We will discuss with cardiology as well. Patient will require admission to the hospital.  ____________________________________________   FINAL CLINICAL IMPRESSION(S) / ED DIAGNOSES  generalized weakness symptomatically bradycardia hyperkalemia   Minna AntisPaduchowski, Tabrina Esty, MD 05/14/17 1759

## 2017-05-14 NOTE — Progress Notes (Signed)
This note also relates to the following rows which could not be included: Pulse Rate - Cannot attach notes to unvalidated device data Resp - Cannot attach notes to unvalidated device data BP - Cannot attach notes to unvalidated device data SpO2 - Cannot attach notes to unvalidated device data  HD STARTED  

## 2017-05-14 NOTE — Care Management Note (Signed)
Case Management Note  Patient Details  Name: Lorra HalsJohn Yogi MRN: [id number] Date of Birth: Oct 17, 1933  Subjective/Objective:   Per Jerilynn Somalvin at [facility], Mr Solon AugustaBynum is an [facility]. This Clinical research associatewriter requested that [facility] check on Mr Solon AugustaBynum more frequently because he has missed dialysis x 7 days per weakness. Per Jerilynn Somalvin, [facility] Mr Solon AugustaBynum at home on 05/06/17.                Action/Plan:   Expected Discharge Date:                  Expected Discharge Plan:     In-House Referral:     Discharge planning Services     Post Acute Care Choice:    Choice offered to:     DME Arranged:    DME Agency:     HH Arranged:    HH Agency:     Status of Service:     If discussed at [employer] Length of Stay Meetings, dates discussed:    Additional Comments:  Waverly Chavarria A, RN 05/14/2017, 4:37 PM

## 2017-05-14 NOTE — Progress Notes (Signed)
POST DIALYSIS ASSESSMENT 

## 2017-05-14 NOTE — ED Triage Notes (Signed)
Pt to ED via EMS from home c/o weakness x1 week getting worse.  Patient goes to dialysis, last session patient made was last Saturday.  Pt not c/o pain, SOB, n/v/d.  Presents A&Ox4, speaking in complete and coeherent sentences, with DNR.  EMS CBG 117.

## 2017-05-14 NOTE — Progress Notes (Signed)
HD COMPLETED  

## 2017-05-14 NOTE — ED Notes (Signed)
Patient gave permission to give grandson information over the phone.   Fayrene FearingJames (grandson): [phone]

## 2017-05-14 NOTE — ED Notes (Signed)
Stickers, EKG, and DNR tubed to 2A.

## 2017-05-14 NOTE — ED Notes (Signed)
Charlie from dialysis states ready for patient and will be monitored during procedure.  MD notified and aware.

## 2017-05-14 NOTE — ED Notes (Signed)
Patient HR going as low as 33bpm, MD notified, Zoll pads placed on patient.

## 2017-05-15 LAB — GLUCOSE, CAPILLARY: Glucose-Capillary: 81 mg/dL (ref 65–99)

## 2017-05-15 LAB — CBC
HEMATOCRIT: 23.8 % — AB (ref 40.0–52.0)
HEMOGLOBIN: 7.9 g/dL — AB (ref 13.0–18.0)
MCH: 29.4 pg (ref 26.0–34.0)
MCHC: 33.3 g/dL (ref 32.0–36.0)
MCV: 88.1 fL (ref 80.0–100.0)
Platelets: 171 10*3/uL (ref 150–440)
RBC: 2.71 MIL/uL — ABNORMAL LOW (ref 4.40–5.90)
RDW: 14 % (ref 11.5–14.5)
WBC: 4.7 10*3/uL (ref 3.8–10.6)

## 2017-05-15 LAB — BASIC METABOLIC PANEL
ANION GAP: 9 (ref 5–15)
BUN: 34 mg/dL — ABNORMAL HIGH (ref 6–20)
CHLORIDE: 102 mmol/L (ref 101–111)
CO2: 29 mmol/L (ref 22–32)
Calcium: 8.2 mg/dL — ABNORMAL LOW (ref 8.9–10.3)
Creatinine, Ser: 4.67 mg/dL — ABNORMAL HIGH (ref 0.61–1.24)
GFR calc Af Amer: 12 mL/min — ABNORMAL LOW (ref >60)
GFR, EST NON AFRICAN AMERICAN: 11 mL/min — AB (ref >60)
Glucose, Bld: 80 mg/dL (ref 65–99)
POTASSIUM: 3.9 mmol/L (ref 3.5–5.1)
SODIUM: 140 mmol/L (ref 135–145)

## 2017-05-15 LAB — MRSA PCR SCREENING: MRSA BY PCR: NEGATIVE

## 2017-05-15 NOTE — Plan of Care (Signed)
Problem: Activity: Goal: Risk for activity intolerance will decrease Outcome: Progressing Pt will be encouraged OOB to chair daily.

## 2017-05-15 NOTE — Progress Notes (Signed)
Pt has a 2.7 sec pause reported by CMD. MD Park BreedKahn notified. No new orders. I will continue to assess and notify prime MD.

## 2017-05-15 NOTE — Consult Note (Signed)
Eric Yu is a 83 y.o. male  [id number]  Primary Cardiologist: Adrian Blackwater Reason for Consultation: Severe bradycardia  HPI: This is a 83 year old African-American male with a past medical history of having severe bradycardia secondary to missing dialysis for a whole week and presented again with similar presentation. Patient complained of dizziness and feeling like he is A Pass out but Missed His Dialysis 3 Times and Underwent Dialysis in the Hospital with Heart Rate Initially in the 30s and Now in the 70s. He Denies Any Chest Pain No Orthopnea PND or Leg Swelling   Review of Systems: No chest pain orthopnea PND or so frank syncope   Past Medical History:  Diagnosis Date  . Anemia   . Chronic kidney disease   . COPD (chronic obstructive pulmonary disease) (HCC)   . Depression   . Gout   . Hypertension   . Iron deficiency     Medications Prior to Admission  Medication Sig Dispense Refill  . albuterol (PROVENTIL HFA;VENTOLIN HFA) 108 (90 Base) MCG/ACT inhaler Inhale 2 puffs into the lungs every morning.     Marland Kitchen amLODipine (NORVASC) 10 MG tablet Take 1 tablet (10 mg total) by mouth every morning. 30 tablet 0  . aspirin EC 81 MG tablet Take 81 mg by mouth every morning.     . calcium acetate (PHOSLO) 667 MG capsule Take 667 mg by mouth 3 (three) times daily with meals.     . hydrALAZINE (APRESOLINE) 100 MG tablet Take 1 tablet (100 mg total) by mouth 3 (three) times daily. 90 tablet 1  . lidocaine-prilocaine (EMLA) cream Apply 1 application topically every dialysis.    Marland Kitchen losartan (COZAAR) 100 MG tablet Take 1 tablet (100 mg total) by mouth at bedtime. 30 tablet 1  . senna-docusate (SENOKOT-S) 8.6-50 MG tablet Take 1 tablet by mouth at bedtime as needed for mild constipation. 30 tablet 0  . torsemide (DEMADEX) 20 MG tablet Take 1 tablet (20 mg total) by mouth daily. 30 tablet 0     . albuterol  3 mL Inhalation BH-q7a  . aspirin EC  81 mg Oral BH-q7a  . docusate sodium  100 mg  Oral BID  . heparin  5,000 Units Subcutaneous Q8H  . hydrALAZINE  100 mg Oral TID  . losartan  100 mg Oral QHS    Infusions:   Allergies  Allergen Reactions  . Cephalexin     Other reaction(s): Other (See Comments) Gross hematuria - occurred once. Not certain whether it was coincidence or causative.    Social History   Social History  . Marital status: Widowed    Spouse name: N/A  . Number of children: N/A  . Years of education: N/A   Occupational History  . Not on file.   Social History Main Topics  . Smoking status: Current Some Day Smoker  . Smokeless tobacco: Never Used     Comment: pt is currently using nicotine patches  . Alcohol use No  . Drug use: No  . Sexual activity: Not Currently   Other Topics Concern  . Not on file   Social History Narrative  . No narrative on file    Family History  Problem Relation Age of Onset  . Cancer Sister   . Hypertension Sister   . Urolithiasis Neg Hx   . Prostate cancer Neg Hx   . Kidney disease Neg Hx   . Kidney cancer Neg Hx     PHYSICAL EXAM: Vitals:  05/15/17 0415 05/15/17 0743  BP: (!) 143/58   Pulse: 72   Resp: 18   Temp: 97.9 F (36.6 C)   SpO2: 98% 92%     Intake/Output Summary (Last 24 hours) at 05/15/17 1310 Last data filed at 05/15/17 0001  Gross per 24 hour  Intake                0 ml  Output              700 ml  Net             -700 ml    General:  Well appearing. No respiratory difficulty HEENT: normal Neck: supple. no JVD. Carotids 2+ bilat; no bruits. No lymphadenopathy or thryomegaly appreciated. Cor: PMI nondisplaced. Regular rate & rhythm. No rubs, gallops or murmurs. Lungs: clear Abdomen: soft, nontender, nondistended. No hepatosplenomegaly. No bruits or masses. Good bowel sounds. Extremities: no cyanosis, clubbing, rash, edema Neuro: alert & oriented x 3, cranial nerves grossly intact. moves all 4 extremities w/o difficulty. Affect pleasant.  ECG: Severe bradycardia with a  heart rate 37 nonspecific interventricular conduction delay and nonspecific ST-T changes  Results for orders placed or performed during the hospital encounter of 05/14/17 (from the past 24 hour(s))  CBC with Differential     Status: Abnormal   Collection Time: 05/14/17  4:29 PM  Result Value Ref Range   WBC 5.5 3.8 - 10.6 K/uL   RBC 3.04 (L) 4.40 - 5.90 MIL/uL   Hemoglobin 8.9 (L) 13.0 - 18.0 g/dL   HCT 16.1 (L) 09.6 - 04.5 %   MCV 88.4 80.0 - 100.0 fL   MCH 29.3 26.0 - 34.0 pg   MCHC 33.1 32.0 - 36.0 g/dL   RDW 40.9 81.1 - 91.4 %   Platelets 202 150 - 440 K/uL   Neutrophils Relative % 72 %   Neutro Abs 4.0 1.4 - 6.5 K/uL   Lymphocytes Relative 18 %   Lymphs Abs 1.0 1.0 - 3.6 K/uL   Monocytes Relative 8 %   Monocytes Absolute 0.4 0.2 - 1.0 K/uL   Eosinophils Relative 1 %   Eosinophils Absolute 0.0 0 - 0.7 K/uL   Basophils Relative 1 %   Basophils Absolute 0.1 0 - 0.1 K/uL  Comprehensive metabolic panel     Status: Abnormal   Collection Time: 05/14/17  4:29 PM  Result Value Ref Range   Sodium 137 135 - 145 mmol/L   Potassium 5.4 (H) 3.5 - 5.1 mmol/L   Chloride 107 101 - 111 mmol/L   CO2 17 (L) 22 - 32 mmol/L   Glucose, Bld 116 (H) 65 - 99 mg/dL   BUN 88 (H) 6 - 20 mg/dL   Creatinine, Ser 7.82 (H) 0.61 - 1.24 mg/dL   Calcium 9.3 8.9 - 95.6 mg/dL   Total Protein 7.1 6.5 - 8.1 g/dL   Albumin 3.6 3.5 - 5.0 g/dL   AST 14 (L) 15 - 41 U/L   ALT 8 (L) 17 - 63 U/L   Alkaline Phosphatase 76 38 - 126 U/L   Total Bilirubin 0.7 0.3 - 1.2 mg/dL   GFR calc non Af Amer 5 (L) >60 mL/min   GFR calc Af Amer 6 (L) >60 mL/min   Anion gap 13 5 - 15  Troponin I     Status: Abnormal   Collection Time: 05/14/17  4:29 PM  Result Value Ref Range   Troponin I 0.06 (HH) <0.03 ng/mL  Magnesium  Status: None   Collection Time: 05/14/17  4:29 PM  Result Value Ref Range   Magnesium 2.4 1.7 - 2.4 mg/dL  Phosphorus     Status: None   Collection Time: 05/14/17  7:15 PM  Result Value Ref Range    Phosphorus 4.5 2.5 - 4.6 mg/dL  Glucose, capillary     Status: None   Collection Time: 05/14/17  8:58 PM  Result Value Ref Range   Glucose-Capillary 76 65 - 99 mg/dL  MRSA PCR Screening     Status: None   Collection Time: 05/14/17 11:39 PM  Result Value Ref Range   MRSA by PCR NEGATIVE NEGATIVE  Basic metabolic panel     Status: Abnormal   Collection Time: 05/15/17  5:29 AM  Result Value Ref Range   Sodium 140 135 - 145 mmol/L   Potassium 3.9 3.5 - 5.1 mmol/L   Chloride 102 101 - 111 mmol/L   CO2 29 22 - 32 mmol/L   Glucose, Bld 80 65 - 99 mg/dL   BUN 34 (H) 6 - 20 mg/dL   Creatinine, Ser 8.294.67 (H) 0.61 - 1.24 mg/dL   Calcium 8.2 (L) 8.9 - 10.3 mg/dL   GFR calc non Af Amer 11 (L) >60 mL/min   GFR calc Af Amer 12 (L) >60 mL/min   Anion gap 9 5 - 15  CBC     Status: Abnormal   Collection Time: 05/15/17  5:29 AM  Result Value Ref Range   WBC 4.7 3.8 - 10.6 K/uL   RBC 2.71 (L) 4.40 - 5.90 MIL/uL   Hemoglobin 7.9 (L) 13.0 - 18.0 g/dL   HCT 56.223.8 (L) 13.040.0 - 86.552.0 %   MCV 88.1 80.0 - 100.0 fL   MCH 29.4 26.0 - 34.0 pg   MCHC 33.3 32.0 - 36.0 g/dL   RDW 78.414.0 69.611.5 - 29.514.5 %   Platelets 171 150 - 440 K/uL  Glucose, capillary     Status: None   Collection Time: 05/15/17  7:56 AM  Result Value Ref Range   Glucose-Capillary 81 65 - 99 mg/dL   Comment 1 Notify RN    Dg Chest 2 View  Result Date: 05/14/2017 CLINICAL DATA:  Cough for 1 week and weakness. EXAM: CHEST  2 VIEW COMPARISON:  04/09/2017 and prior radiographs FINDINGS: Cardiomegaly noted. Mild to moderate diffuse interstitial opacities are present. Small bilateral pleural effusions are noted. Bibasilar opacities are present -question atelectasis versus airspace disease. No pneumothorax or acute bony abnormality. IMPRESSION: Mild to moderate diffuse interstitial opacities -favor pulmonary edema over infection. Bibasilar opacities-favor focal edema or atelectasis over infection. Cardiomegaly and trace bilateral pleural effusions.  Electronically Signed   By: Harmon PierJeffrey  Hu M.D.   On: 05/14/2017 17:17     ASSESSMENT AND PLAN: Severe bradycardia with presyncope secondary to noncompliance with hemodialysis. After hemodialysis his heart rate has nicely recovered just like last admission and currently is in sinus rhythm at 70 bpm. No further workup other than arrangement for dialysis on a regular basis needed. Thank you very much for referral.  Cathie Bonnell A

## 2017-05-15 NOTE — Progress Notes (Signed)
PT Cancellation Note  Patient Details Name: Eric Yu MRN: [id number] DOB: April 19, 1934   Cancelled Treatment:    Reason Eval/Treat Not Completed: Fatigue/lethargy limiting ability to participate (patient admitted with weakness; He has ESRD and is on dialysis; he has not had dialysis for 1 week and then came to ED with bradycardia; underwent emergent dialysis yesterday; reports feeling really tired and weak and did not want to get up this PM; ) Hemaglobin was 7.9; will re-attempt PT eval when patient is medically ready and appropriate. Thank you for this referral.   Trotter,Margaret PT, DPT 05/15/2017, 2:24 PM

## 2017-05-15 NOTE — Progress Notes (Signed)
Patient admitted after his dialysis treatment. Alert and oriented x 3. Denied any acute pain at this time. No respiratory distress noted. Tele box called to CCMD and was verified by Marchelle FolksAmanda NT. Skin assessment done with Anson CroftsAdrienne W. RN, noted bilateral dry flaky feet, ingrown toe nails and fistula to the left upper arm. Patient oriented to his room, call bell and staff. Patient voiced no concerns. Will continue to monitor.

## 2017-05-15 NOTE — Progress Notes (Signed)
[facility]  ROUNDING NOTE   Subjective:  Patient well known to [place] from last admission. He now presents after having missed dialysis 1 week. Next line he presented very similarly in late July. He was bradycardic this admission as well but his heart rate did improve after dialysis yesterday. Overall patient remains quite weak. Next line he lives alone at home.   Objective:  Vital signs in last 24 hours:  Temp:  [97.5 F (36.4 C)-98.5 F (36.9 C)] 97.9 F (36.6 C) (09/02 0415) Pulse Rate:  [36-80] 72 (09/02 0415) Resp:  [12-18] 18 (09/02 0415) BP: (134-172)/(45-71) 143/58 (09/02 0415) SpO2:  [92 %-98 %] 92 % (09/02 0743) Weight:  [70.3 kg (155 lb)-72.3 kg (159 lb 4.8 oz)] 72.3 kg (159 lb 4.8 oz) (09/02 0415)  Weight change:  Filed Weights   05/14/17 1628 05/15/17 0415  Weight: 70.3 kg (155 lb) 72.3 kg (159 lb 4.8 oz)    Intake/Output: I/O last 3 completed shifts: In: -  Out: 700 [Urine:200; Other:500]   Intake/Output this shift:  No intake/output data recorded.  Physical Exam: General: Chronically ill-appearing   Head: Normocephalic, atraumatic. Moist oral mucosal membranes  Eyes: Anicteric  Neck: Supple, trachea midline  Lungs:  Clear to auscultation, normal effort  Heart: S1S2 no rubs  Abdomen:  Soft, nontender, bowel sounds present  Extremities: No peripheral edema.  Neurologic: Awake, alert, following commands  Skin: No lesions  Access: Left upper extremity AV fistula     Basic Metabolic Panel:  Recent Labs Lab 05/14/17 1629 05/14/17 1915 05/15/17 0529  NA 137  --  140  K 5.4*  --  3.9  CL 107  --  102  CO2 17*  --  29  GLUCOSE 116*  --  80  BUN 88*  --  34*  CREATININE 9.08*  --  4.67*  CALCIUM 9.3  --  8.2*  MG 2.4  --   --   PHOS  --  4.5  --     Liver Function Tests:  Recent Labs Lab 05/14/17 1629  AST 14*  ALT 8*  ALKPHOS 76  BILITOT 0.7  PROT 7.1  ALBUMIN 3.6   No results for input(s): LIPASE, AMYLASE in the last  168 hours. No results for input(s): AMMONIA in the last 168 hours.  CBC:  Recent Labs Lab 05/14/17 1629 05/15/17 0529  WBC 5.5 4.7  NEUTROABS 4.0  --   HGB 8.9* 7.9*  HCT 26.9* 23.8*  MCV 88.4 88.1  PLT 202 171    Cardiac Enzymes:  Recent Labs Lab 05/14/17 1629  TROPONINI 0.06*    BNP: Invalid input(s): POCBNP  CBG:  Recent Labs Lab 05/14/17 2058 05/15/17 0756  GLUCAP 76 81    Microbiology: Results for orders placed or performed during the hospital encounter of 05/14/17  MRSA PCR Screening     Status: None   Collection Time: 05/14/17 11:39 PM  Result Value Ref Range Status   MRSA by PCR NEGATIVE NEGATIVE Final    Comment:        The GeneXpert MRSA Assay (FDA approved for NASAL specimens only), is one component of a comprehensive MRSA colonization surveillance program. It is not intended to diagnose MRSA infection nor to guide or monitor treatment for MRSA infections.     Coagulation Studies: No results for input(s): LABPROT, INR in the last 72 hours.  Urinalysis: No results for input(s): COLORURINE, LABSPEC, PHURINE, GLUCOSEU, HGBUR, BILIRUBINUR, KETONESUR, PROTEINUR, UROBILINOGEN, NITRITE, LEUKOCYTESUR in the last  72 hours.  Invalid input(s): APPERANCEUR    Imaging: Dg Chest 2 View  Result Date: 05/14/2017 CLINICAL DATA:  Cough for 1 week and weakness. EXAM: CHEST  2 VIEW COMPARISON:  04/09/2017 and prior radiographs FINDINGS: Cardiomegaly noted. Mild to moderate diffuse interstitial opacities are present. Small bilateral pleural effusions are noted. Bibasilar opacities are present -question atelectasis versus airspace disease. No pneumothorax or acute bony abnormality. IMPRESSION: Mild to moderate diffuse interstitial opacities -favor pulmonary edema over infection. Bibasilar opacities-favor focal edema or atelectasis over infection. Cardiomegaly and trace bilateral pleural effusions. Electronically Signed   By: Harmon PierJeffrey  Hu M.D.   On: 05/14/2017  17:17     Medications:    . albuterol  3 mL Inhalation BH-q7a  . aspirin EC  81 mg Oral BH-q7a  . docusate sodium  100 mg Oral BID  . heparin  5,000 Units Subcutaneous Q8H  . hydrALAZINE  100 mg Oral TID  . losartan  100 mg Oral QHS   acetaminophen **OR** acetaminophen, alteplase, bisacodyl, heparin, HYDROcodone-acetaminophen, ondansetron **OR** ondansetron (ZOFRAN) IV, traZODone  Assessment/ Plan:  83 y.o. male with past medical history of end-stage renal disease on hemodialysis, hypertension, depression, COPD, history of gout, chronic anemia who presents now with bradycardia, missed dialysis 1 week. Patient presented similarly in July. He lives by himself.  [facility]   1. [facility] Missed one week of hemodialysis. Currently uremic - Patient underwent hemodialysis yesterday. Heart rate improved after restarting dialysis. This is the second admission for similar issue. Suspect that he will need placement.  2. Anemia of chronic kidney disease: Hemoglobin down to 7.9. He missed at least 3 doses of Epogen recently. We'll restart Epogen with next [place]'s treatment.  3. Secondary hyperparathyroidism: Phosphorus currently 4.5 and acceptable.  4. Hypertension: Continue hydralazine and losartan for now.  5. Generalized weakness. Suspect that patient will require placement as this is his second admission with a very similar issue.   LOS: 1 Pike Scantlebury 9/2/201812:10 PM

## 2017-05-15 NOTE — Progress Notes (Signed)
[facility]   PATIENT NAME: Eric Yu    MR#:  [id number]  DATE OF BIRTH:  08/25/1934  SUBJECTIVE:   Patient still feels weak.  REVIEW OF SYSTEMS:    Review of Systems  Constitutional: Negative for fever, chills weight loss Generalized weakness HENT: Negative for ear pain, nosebleeds, congestion, facial swelling, rhinorrhea, neck pain, neck stiffness and ear discharge.   Respiratory: Negative for cough, shortness of breath, wheezing  Cardiovascular: Negative for chest pain, palpitations and leg swelling.  Gastrointestinal: Negative for heartburn, abdominal pain, vomiting, diarrhea or consitpation Genitourinary: Negative for dysuria, urgency, frequency, hematuria Musculoskeletal: Negative for back pain or joint pain Neurological: Negative for dizziness, seizures, syncope, focal weakness,  numbness and headaches.  Hematological: Does not bruise/bleed easily.  Psychiatric/Behavioral: Negative for hallucinations, confusion, dysphoric mood    Tolerating Diet: yes      DRUG ALLERGIES:   Allergies  Allergen Reactions  . Cephalexin     Other reaction(s): Other (See Comments) Gross hematuria - occurred once. Not certain whether it was coincidence or causative.    VITALS:  Blood pressure (!) 143/58, pulse 72, temperature 97.9 F (36.6 C), temperature source Oral, resp. rate 18, height 5\' 8"  (1.727 m), weight 72.3 kg (159 lb 4.8 oz), SpO2 92 %.  PHYSICAL EXAMINATION:  Constitutional: AppearsThin and frail. No distress. HENT: Normocephalic. Marland Kitchen. Oropharynx is clear and moist.  Eyes: Conjunctivae and EOM are normal. PERRLA, no scleral icterus.  Neck: Normal ROM. Neck supple. No JVD. No tracheal deviation. CVS: RRR, S1/S2 +, no murmurs, no gallops, no carotid bruit.  Pulmonary: Effort and breath sounds normal, no stridor, rhonchi, wheezes, rales.  Abdominal: Soft. BS +,  no distension, tenderness, rebound or guarding.  Musculoskeletal:  Normal range of motion. No edema and no tenderness.  Neuro: Alert. CN 2-12 grossly intact. No focal deficits. Skin: Skin is warm and dry. No rash noted. hemodialysis catheter left arm  Psychiatric: Normal mood and affect.      LABORATORY PANEL:   CBC  Recent Labs Lab 05/15/17 0529  WBC 4.7  HGB 7.9*  HCT 23.8*  PLT 171   ------------------------------------------------------------------------------------------------------------------  Chemistries   Recent Labs Lab 05/14/17 1629 05/15/17 0529  NA 137 140  K 5.4* 3.9  CL 107 102  CO2 17* 29  GLUCOSE 116* 80  BUN 88* 34*  CREATININE 9.08* 4.67*  CALCIUM 9.3 8.2*  MG 2.4  --   AST 14*  --   ALT 8*  --   ALKPHOS 76  --   BILITOT 0.7  --    ------------------------------------------------------------------------------------------------------------------  Cardiac Enzymes  Recent Labs Lab 05/14/17 1629  TROPONINI 0.06*   ------------------------------------------------------------------------------------------------------------------  RADIOLOGY:  Dg Chest 2 View  Result Date: 05/14/2017 CLINICAL DATA:  Cough for 1 week and weakness. EXAM: CHEST  2 VIEW COMPARISON:  04/09/2017 and prior radiographs FINDINGS: Cardiomegaly noted. Mild to moderate diffuse interstitial opacities are present. Small bilateral pleural effusions are noted. Bibasilar opacities are present -question atelectasis versus airspace disease. No pneumothorax or acute bony abnormality. IMPRESSION: Mild to moderate diffuse interstitial opacities -favor pulmonary edema over infection. Bibasilar opacities-favor focal edema or atelectasis over infection. Cardiomegaly and trace bilateral pleural effusions. Electronically Signed   By: Harmon PierJeffrey  Hu M.D.   On: 05/14/2017 17:17     ASSESSMENT AND PLAN:   83 year old male with history of end-stage renal disease on hemodialysis who presented with generalized weakness and hyperkalemia and noted to have  bradycardia.  1. Symptomatic  bradycardia which resolved after hemodialysis and thought to be due to hyperkalemia.  2. Generalized weakness: Physical therapy consultation for disposition planning  3. Hyperkalemia: This has resolved after dialysis was completed yesterday. 4. End-stage renal disease on hemodialysis: Consultation from nephrology is appreciated. Continue routine dialysis schedule, Tuesday, Thursday and Saturday.  5. Continue hydralazine and losartan.   Physical therapy consultation for disposition planning    Management plans discussed with the patient and he is in agreement.  CODE STATUS: dnr  TOTAL TIME TAKING CARE OF THIS PATIENT: 30 minutes.     POSSIBLE D/C tomorrow, DEPENDING ON CLINICAL CONDITION.   Yamina Lenis M.D on 05/15/2017 at 9:12 AM  Between 7am to 6pm - Pager - [phone] After 6pm go to [email or web address] - Social research officer, government  [facility]  Office  [phone]  CC: Primary care physician; Lauro Regulus, MD  Note: This dictation was prepared with Dragon dictation along with smaller phrase technology. Any transcriptional errors that result from this process are unintentional.

## 2017-05-15 NOTE — Progress Notes (Signed)
[facility] MD notified. Pt has a 2.7 sec pause and Cardio suggest discontinue telemetry 2/2 patient is DNR and refuses pacemaker placement. Order received. I will continue to assess.

## 2017-05-16 LAB — URINALYSIS, ROUTINE W REFLEX MICROSCOPIC
BILIRUBIN URINE: NEGATIVE
Bacteria, UA: NONE SEEN
GLUCOSE, UA: NEGATIVE mg/dL
HGB URINE DIPSTICK: NEGATIVE
KETONES UR: NEGATIVE mg/dL
LEUKOCYTES UA: NEGATIVE
NITRITE: NEGATIVE
Protein, ur: 100 mg/dL — AB
RBC / HPF: NONE SEEN RBC/hpf (ref 0–5)
SQUAMOUS EPITHELIAL / LPF: NONE SEEN
Specific Gravity, Urine: 1.011 (ref 1.005–1.030)
pH: 7 (ref 5.0–8.0)

## 2017-05-16 LAB — GLUCOSE, CAPILLARY
GLUCOSE-CAPILLARY: 76 mg/dL (ref 65–99)
Glucose-Capillary: 87 mg/dL (ref 65–99)
Glucose-Capillary: 96 mg/dL (ref 65–99)

## 2017-05-16 LAB — HEPATITIS B SURFACE ANTIBODY, QUANTITATIVE: Hepatitis B-Post: 7.9 m[IU]/mL — ABNORMAL LOW (ref >9.9)

## 2017-05-16 LAB — HEPATITIS B SURFACE ANTIGEN: HEP B S AG: NEGATIVE

## 2017-05-16 LAB — PHOSPHORUS: PHOSPHORUS: 3.6 mg/dL (ref 2.5–4.6)

## 2017-05-16 MED ORDER — EPOETIN ALFA 10000 UNIT/ML IJ SOLN
4000.0000 [IU] | INTRAMUSCULAR | Status: DC
  Administered 2017-05-16: 4000 [IU] via INTRAVENOUS

## 2017-05-16 NOTE — Progress Notes (Signed)
Post hd assessment 

## 2017-05-16 NOTE — Progress Notes (Signed)
Pre hd assessment  

## 2017-05-16 NOTE — Care Management (Addendum)
Readmission for similar symptoms.  he missed another week of dialysis and when asked why- responds "I don't know- I did not feel good." Reached out to attending regarding whether patient has capacity to fully understand the consequences on health status.  This CM made referral to [facility] PT OT Aide and SW.  Reached out to agency to discuss how care was gong in the home.  [facility] provides transportation to dialysis [facility] Notified Dimas ChyleAmanda Morris with [facility] of admission. PT and OT consults pending to assess for need for SNF.  Spoke with [facility] and informed that staff have concerns that patient needs assisted living level of care and agency social worker has assessed and patient "does not qualified for medicaid." Apparently there is a daughter that may be able to provide some addition information regarding this but there is no documentation of a daughter as a contact in Epic.  CM has made attempts to speak with patient about other family contacts but he has either been off the unit or sleeping

## 2017-05-16 NOTE — Progress Notes (Signed)
Post hd vitals 

## 2017-05-16 NOTE — Progress Notes (Signed)
Pre hd info 

## 2017-05-16 NOTE — Progress Notes (Addendum)
[facility] - [place] at [facility]   PATIENT NAME: Eric Yu    MR#:  [id number]  DATE OF BIRTH:  04/29/1934  SUBJECTIVE:   Patient Reports that he is feeling better not as weak as he did yesterday. He reports he did not want to go to HD because he was jut weak.   REVIEW OF SYSTEMS:    Review of Systems  Constitutional: Negative for fever, chills weight loss Generalized weakness HENT: Negative for ear pain, nosebleeds, congestion, facial swelling, rhinorrhea, neck pain, neck stiffness and ear discharge.   Respiratory: Negative for cough, shortness of breath, wheezing  Cardiovascular: Negative for chest pain, palpitations and leg swelling.  Gastrointestinal: Negative for heartburn, abdominal pain, vomiting, diarrhea or consitpation Genitourinary: Negative for dysuria, urgency, frequency, hematuria Musculoskeletal: Negative for back pain or joint pain Neurological: Negative for dizziness, seizures, syncope, focal weakness,  numbness and headaches.  Hematological: Does not bruise/bleed easily.  Psychiatric/Behavioral: Negative for hallucinations, confusion, dysphoric mood    Tolerating Diet: yes      DRUG ALLERGIES:   Allergies  Allergen Reactions  . Cephalexin     Other reaction(s): Other (See Comments) Gross hematuria - occurred once. Not certain whether it was coincidence or causative.    VITALS:  Blood pressure (!) 169/62, pulse 72, temperature 98.4 F (36.9 C), temperature source Oral, resp. rate 18, height 5\' 8"  (1.727 m), weight 70.6 kg (155 lb 9.6 oz), SpO2 92 %.  PHYSICAL EXAMINATION:  Constitutional: AppearsThin and frail. No distress. HENT: Normocephalic. Marland Kitchen. Oropharynx is clear and moist.  Eyes: Conjunctivae and EOM are normal. PERRLA, no scleral icterus.  Neck: Normal ROM. Neck supple. No JVD. No tracheal deviation. CVS: RRR, S1/S2 +, no murmurs, no gallops, no carotid bruit.  Pulmonary: Effort and breath sounds normal, no stridor,  rhonchi, wheezes, rales.  Abdominal: Soft. BS +,  no distension, tenderness, rebound or guarding.  Musculoskeletal: Normal range of motion. No edema and no tenderness.  Neuro: Alert. CN 2-12 grossly intact. No focal deficits. Skin: Skin is warm and dry. No rash noted. hemodialysis catheter left arm  Psychiatric: Normal mood and affect.      LABORATORY PANEL:   CBC  Recent Labs Lab 05/15/17 0529  WBC 4.7  HGB 7.9*  HCT 23.8*  PLT 171   ------------------------------------------------------------------------------------------------------------------  Chemistries   Recent Labs Lab 05/14/17 1629 05/15/17 0529  NA 137 140  K 5.4* 3.9  CL 107 102  CO2 17* 29  GLUCOSE 116* 80  BUN 88* 34*  CREATININE 9.08* 4.67*  CALCIUM 9.3 8.2*  MG 2.4  --   AST 14*  --   ALT 8*  --   ALKPHOS 76  --   BILITOT 0.7  --    ------------------------------------------------------------------------------------------------------------------  Cardiac Enzymes  Recent Labs Lab 05/14/17 1629  TROPONINI 0.06*   ------------------------------------------------------------------------------------------------------------------  RADIOLOGY:  Dg Chest 2 View  Result Date: 05/14/2017 CLINICAL DATA:  Cough for 1 week and weakness. EXAM: CHEST  2 VIEW COMPARISON:  04/09/2017 and prior radiographs FINDINGS: Cardiomegaly noted. Mild to moderate diffuse interstitial opacities are present. Small bilateral pleural effusions are noted. Bibasilar opacities are present -question atelectasis versus airspace disease. No pneumothorax or acute bony abnormality. IMPRESSION: Mild to moderate diffuse interstitial opacities -favor pulmonary edema over infection. Bibasilar opacities-favor focal edema or atelectasis over infection. Cardiomegaly and trace bilateral pleural effusions. Electronically Signed   By: Harmon PierJeffrey  Hu M.D.   On: 05/14/2017 17:17     ASSESSMENT AND PLAN:  83 year old male with history of  end-stage renal disease on hemodialysis who presented with generalized weakness and hyperkalemia and noted to have bradycardia.  1. Symptomatic bradycardia which resolved after hemodialysis and thought to be due to hyperkalemia.  2. Generalized weakness: Physical therapy [facility].  3. Hyperkalemia: This has resolved after dialysis was completed yesterday. 4. End-stage renal disease on hemodialysis: Consultation from nephrology is appreciated. Continue routine dialysis schedule, Tuesday, Thursday and Saturday.  5. HTN Continue hydralazine and losartan.  6. Acute hypoxic resp failure: Needs HD today    Patient seems to be able to make his own decisions and does not lack capacity on my examination today.  Physical therapy consultation for disposition planning    Management plans discussed with the patient and he is in agreement.  CODE STATUS: dnr  TOTAL TIME TAKING CARE OF THIS PATIENT: 24 minutes.     POSSIBLE D/C tomorrow , DEPENDING ON CLINICAL CONDITION.   Eric Yu M.D on 05/16/2017 at 11:04 AM  Between 7am to 6pm - Pager - [phone] After 6pm go to [email or web address] - Social research officer, government  [facility]  Office  [phone]  CC: Primary care physician; Eric Regulus, MD  Note: This dictation was prepared with Dragon dictation along with smaller phrase technology. Any transcriptional errors that result from this process are unintentional.

## 2017-05-16 NOTE — Progress Notes (Signed)
   Hd start, pt alert, no c/o, stable 

## 2017-05-16 NOTE — Progress Notes (Signed)
[facility]  ROUNDING NOTE   Subjective:  Patient with oxygen desaturation this a.m.  he is due for hemodialysis today. Orders have been prepared.   Objective:  Vital signs in last 24 hours:  Temp:  [97.8 F (36.6 C)-98.4 F (36.9 C)] 98.4 F (36.9 C) (09/03 0901) Pulse Rate:  [67-72] 72 (09/03 0901) Resp:  [14-18] 18 (09/03 0901) BP: (153-169)/(56-76) 169/62 (09/03 0901) SpO2:  [84 %-97 %] 92 % (09/03 0901) Weight:  [70.6 kg (155 lb 9.6 oz)] 70.6 kg (155 lb 9.6 oz) (09/03 0513)  Weight change: 0.272 kg (9.6 oz) Filed Weights   05/14/17 1628 05/15/17 0415 05/16/17 0513  Weight: 70.3 kg (155 lb) 72.3 kg (159 lb 4.8 oz) 70.6 kg (155 lb 9.6 oz)    Intake/Output: I/O last 3 completed shifts: In: 120 [P.O.:120] Out: 1100 [Urine:600; Other:500]   Intake/Output this shift:  Total I/O In: 240 [P.O.:240] Out: -   Physical Exam: General: Chronically ill-appearing   Head: Normocephalic, atraumatic. Moist oral mucosal membranes  Eyes: Anicteric  Neck: Supple, trachea midline  Lungs:  Clear to auscultation, normal effort  Heart: S1S2 no rubs  Abdomen:  Soft, nontender, bowel sounds present  Extremities: No peripheral edema.  Neurologic: Awake, alert, following commands  Skin: No lesions  Access: Left upper extremity AV fistula     Basic Metabolic Panel:  Recent Labs Lab 05/14/17 1629 05/14/17 1915 05/15/17 0529  NA 137  --  140  K 5.4*  --  3.9  CL 107  --  102  CO2 17*  --  29  GLUCOSE 116*  --  80  BUN 88*  --  34*  CREATININE 9.08*  --  4.67*  CALCIUM 9.3  --  8.2*  MG 2.4  --   --   PHOS  --  4.5  --     Liver Function Tests:  Recent Labs Lab 05/14/17 1629  AST 14*  ALT 8*  ALKPHOS 76  BILITOT 0.7  PROT 7.1  ALBUMIN 3.6   No results for input(s): LIPASE, AMYLASE in the last 168 hours. No results for input(s): AMMONIA in the last 168 hours.  CBC:  Recent Labs Lab 05/14/17 1629 05/15/17 0529  WBC 5.5 4.7  NEUTROABS 4.0  --    HGB 8.9* 7.9*  HCT 26.9* 23.8*  MCV 88.4 88.1  PLT 202 171    Cardiac Enzymes:  Recent Labs Lab 05/14/17 1629  TROPONINI 0.06*    BNP: Invalid input(s): POCBNP  CBG:  Recent Labs Lab 05/14/17 2058 05/15/17 0756 05/16/17 0813  GLUCAP 76 81 76    Microbiology: Results for orders placed or performed during the hospital encounter of 05/14/17  MRSA PCR Screening     Status: None   Collection Time: 05/14/17 11:39 PM  Result Value Ref Range Status   MRSA by PCR NEGATIVE NEGATIVE Final    Comment:        The GeneXpert MRSA Assay (FDA approved for NASAL specimens only), is one component of a comprehensive MRSA colonization surveillance program. It is not intended to diagnose MRSA infection nor to guide or monitor treatment for MRSA infections.     Coagulation Studies: No results for input(s): LABPROT, INR in the last 72 hours.  Urinalysis:  Recent Labs  05/16/17 0524  COLORURINE YELLOW*  LABSPEC 1.011  PHURINE 7.0  GLUCOSEU NEGATIVE  HGBUR NEGATIVE  BILIRUBINUR NEGATIVE  KETONESUR NEGATIVE  PROTEINUR 100*  NITRITE NEGATIVE  LEUKOCYTESUR NEGATIVE  Imaging: Dg Chest 2 View  Result Date: 05/14/2017 CLINICAL DATA:  Cough for 1 week and weakness. EXAM: CHEST  2 VIEW COMPARISON:  04/09/2017 and prior radiographs FINDINGS: Cardiomegaly noted. Mild to moderate diffuse interstitial opacities are present. Small bilateral pleural effusions are noted. Bibasilar opacities are present -question atelectasis versus airspace disease. No pneumothorax or acute bony abnormality. IMPRESSION: Mild to moderate diffuse interstitial opacities -favor pulmonary edema over infection. Bibasilar opacities-favor focal edema or atelectasis over infection. Cardiomegaly and trace bilateral pleural effusions. Electronically Signed   By: Harmon PierJeffrey  Hu M.D.   On: 05/14/2017 17:17     Medications:    . albuterol  3 mL Inhalation BH-q7a  . aspirin EC  81 mg Oral BH-q7a  . docusate  sodium  100 mg Oral BID  . heparin  5,000 Units Subcutaneous Q8H  . hydrALAZINE  100 mg Oral TID  . losartan  100 mg Oral QHS   acetaminophen **OR** acetaminophen, alteplase, bisacodyl, heparin, HYDROcodone-acetaminophen, ondansetron **OR** ondansetron (ZOFRAN) IV, traZODone  Assessment/ Plan:  83 y.o. male with past medical history of end-stage renal disease on hemodialysis, hypertension, depression, COPD, history of gout, chronic anemia who presents now with bradycardia, missed dialysis 1 week. Patient presented similarly in July. He lives by himself.  MWF [facility]   1. [facility]. Missed one week of hemodialysis.  -  Patient due for another dialysis treatment today. Orders have been prepared. We may need to increase ultrafiltration target.  2. Anemia of chronic kidney disease: Hemoglobin down to 7.9. We have started the patient on Epogen 4000 units IV with dialysis.  3. Secondary hyperparathyroidism: Recheck serum phosphorus today.  4. Hypertension: Continue losartan and hydralazine.  5. Generalized weakness. Suspect that patient will require placement as this is his second admission with a very similar issue.   LOS: 2 Ocia Simek 9/3/201811:04 AM

## 2017-05-16 NOTE — Evaluation (Signed)
Occupational Therapy Evaluation Patient Details Name: Eric Yu MRN: [id number] DOB: May 04, 1934 Today's Date: 05/16/2017    History of Present Illness 83 year old male with history of end-stage renal disease on hemodialysis who presented with generalized weakness and hyperkalemia and noted to have bradycardia. Noted pt has missed his dialysis recently.   Clinical Impression   Pt seen for OT evaluation this date. Pt lives by himself, requiring assist from his sister for IADL and buttoning. Pt presents with 10/10 abdominal pain per pt report, decreased activity tolerance, global weakness, and able to follow commands consistently, just requiring verbal cues to attend, as pt is very fatigued, unable to keep eyes open. Pt declined to trial out of bed or sitting EOB due to pain and fatigue this date. Pt will benefit from skilled OT services to address noted impairments and functional deficits in order to maximize return to PLOF. Recommend transition to STR following hospitalization prior to return home.    Follow Up Recommendations  SNF    Equipment Recommendations       Recommendations for Other Services       Precautions / Restrictions Precautions Precautions: Fall Restrictions Weight Bearing Restrictions: No      Mobility Bed Mobility Overal bed mobility: Needs Assistance             General bed mobility comments: pt able to roll side to side without difficulty, declined to sit EOB due to abdominal pain and fatigue  Transfers Overall transfer level: Needs assistance               General transfer comment: pt declined to trial due to abdominal pain and fatigue    Balance                                           ADL either performed or assessed with clinical judgement   ADL Overall ADL's : Needs assistance/impaired Eating/Feeding: Bed level;Set up   Grooming: Bed level;Set up   Upper Body Bathing: Minimal assistance;Bed level   Lower Body  Bathing: Bed level;Minimal assistance   Upper Body Dressing : Minimal assistance;Bed level   Lower Body Dressing: Minimal assistance;Bed level     Toilet Transfer Details (indicate cue type and reason): pt declined to attempt at this time, reports fatigue after PT                 Vision Baseline Vision/History: Wears glasses Wears Glasses: At all times Patient Visual Report: No change from baseline Vision Assessment?: No apparent visual deficits     Perception     Praxis      Pertinent Vitals/Pain Pain Assessment: 0-10 Pain Score: 10-Worst pain ever Pain Location: abdomen Pain Intervention(s): Limited activity within patient's tolerance;Monitored during session     Hand Dominance Right   Extremity/Trunk Assessment Upper Extremity Assessment Upper Extremity Assessment: Generalized weakness (4-/5 bilaterally)   Lower Extremity Assessment Lower Extremity Assessment: Generalized weakness;Defer to PT evaluation       Communication Communication Communication: No difficulties   Cognition Arousal/Alertness: Lethargic Behavior During Therapy: WFL for tasks assessed/performed Overall Cognitive Status: Within Functional Limits for tasks assessed                                 General Comments: pt lethargic/fatigued, requires verbal cues to attend to questions/tasks, but appropriate with  responses, oriented to self, place, and situation (not oriented to day of the week but knew it was the 9th month and knew it was Labor Day); pt reports unable to read   General Comments       Exercises     Shoulder Instructions      Home Living Family/patient expects to be discharged to:: Private residence Living Arrangements: Alone   Type of Home: House Home Access: Ramped entrance (per pt no steps, per chart, ramped entrance)     Home Layout: One level     Bathroom Shower/Tub: Chief Strategy OfficerTub/shower unit   Bathroom Toilet: Handicapped height     Home Equipment:  Environmental consultantWalker - 2 wheels;Cane - single point;Shower seat;Adaptive equipment          Prior Functioning/Environment Level of Independence: Needs assistance  Gait / Transfers Assistance Needed: pt reports using SPC for mobility ADL's / Homemaking Assistance Needed: Pt's sister manages meds, meals, cleaning, assists pt with button up shirts, and drives pt; pt takes seated showers   Comments: Pt denies falls in past 12 months.        OT Problem List: Decreased strength;Pain;Decreased activity tolerance      OT Treatment/Interventions: Self-care/ADL training;Therapeutic exercise;Therapeutic activities;Energy conservation;DME and/or AE instruction;Patient/family education    OT Goals(Current goals can be found in the care plan section) Acute Rehab OT Goals Patient Stated Goal: get better OT Goal Formulation: With patient Time For Goal Achievement: 05/30/17 Potential to Achieve Goals: Good  OT Frequency: Min 1X/week   Barriers to D/C: Decreased caregiver support          Co-evaluation              AM-PAC PT "6 Clicks" Daily Activity     Outcome Measure Help from another person eating meals?: None Help from another person taking care of personal grooming?: None Help from another person toileting, which includes using toliet, bedpan, or urinal?: A Lot Help from another person bathing (including washing, rinsing, drying)?: A Lot Help from another person to put on and taking off regular upper body clothing?: A Little Help from another person to put on and taking off regular lower body clothing?: A Lot 6 Click Score: 17   End of Session    Activity Tolerance: Patient limited by fatigue;Patient limited by pain Patient left: in bed;with call bell/phone within reach;with bed alarm set  OT Visit Diagnosis: Other abnormalities of gait and mobility (R26.89);Muscle weakness (generalized) (M62.81);Pain Pain - part of body:  (abdomen)                Time: [id number] OT Time Calculation  (min): 15 min Charges:  OT General Charges $OT Visit: 1 Visit OT Evaluation $OT Eval Low Complexity: 1 Low G-Codes: OT G-codes **NOT FOR INPATIENT CLASS** Functional Assessment Tool Used: AM-PAC 6 Clicks Daily Activity;Clinical judgement Functional Limitation: Self care Self Care Current Status ([id number]): At least 40 percent but less than 60 percent impaired, limited or restricted Self Care Goal Status ([id number]): At least 1 percent but less than 20 percent impaired, limited or restricted   Richrd PrimeJamie Stiller, MPH, MS, OTR/L ascom [phone] 05/16/17, 12:03 PM

## 2017-05-16 NOTE — Evaluation (Signed)
Physical Therapy Evaluation Patient Details Name: Eric Yu MRN: [id number] DOB: Mar 24, 1934 Today's Date: 05/16/2017   History of Present Illness  83 year old male with history of end-stage renal disease on hemodialysis who presented with generalized weakness and hyperkalemia and noted to have bradycardia. Noted pt has missed his dialysis recently.  Clinical Impression  83 yo Male came to ED with weakness and bradycardia after missing dialysis for approximately 1 week. Patient has transportation to dialysis, but reports that he was too tired to get up out of bed. Patient lives alone. His sister comes by to help give him his meds. Unsure if she could help more with ADLs. Patient was ambulating with SPC/RW at times. He denies any recent falls. He does demonstrate increased weakness in BLE (grossly 3/5 bilaterally). Patient is supervision for bed mobility; requires min A for sit<>Stand transfers. He ambulated 50 feet with RW, min A. Patient is incontinent. He demonstrates fair/fair- balance. Patient would benefit from additional skilled PT intervention to improve strength and mobility. Concerned about patient going home as he needs more help with ADLs. Recommend STR/SNF upon discharge to work on strength and mobility;     Follow Up Recommendations SNF    Equipment Recommendations  None recommended by PT    Recommendations for Other Services Rehab consult     Precautions / Restrictions Precautions Precautions: Fall Restrictions Weight Bearing Restrictions: No      Mobility  Bed Mobility Overal bed mobility: Needs Assistance Bed Mobility: Supine to Sit     Supine to sit: Supervision     General bed mobility comments: with elevated head of bed and bed rail; patient required increased time;   Transfers Overall transfer level: Needs assistance Equipment used: Rolling walker (2 wheeled) Transfers: Sit to/from Stand Sit to Stand: Min assist         General transfer comment:  Patient requires Min A with RW; upon standing patient immediately started urinating. Reports incontinence with standing/weight bearing activity; Patient transferred x3 reps with Min A;   Ambulation/Gait Ambulation/Gait assistance: Min assist Ambulation Distance (Feet): 50 Feet Assistive device: Rolling walker (2 wheeled) Gait Pattern/deviations: Step-through pattern;Decreased step length - right;Decreased step length - left;Decreased dorsiflexion - right;Decreased dorsiflexion - left;Shuffle;Trunk flexed Gait velocity: decreased   General Gait Details: patient demonstrates forward weight shift, slow gait speed with short shuffled steps;   Stairs            Wheelchair Mobility    Modified Rankin (Stroke Patients Only)       Balance Overall balance assessment: Needs assistance Sitting-balance support: Bilateral upper extremity supported;Feet supported Sitting balance-Leahy Scale: Fair Sitting balance - Comments: posterior lean with dynamic movement;    Standing balance support: Bilateral upper extremity supported Standing balance-Leahy Scale: Fair (fair-)                               Pertinent Vitals/Pain Pain Assessment: 0-10 Pain Score: 10-Worst pain ever Pain Location: abdominal pain;  Pain Intervention(s): Limited activity within patient's tolerance;Repositioned;Monitored during session    Home Living Family/patient expects to be discharged to:: Private residence Living Arrangements: Alone Available Help at Discharge: Family;Available PRN/intermittently (sister helps some, but would not be able to help him daily; ) Type of Home: House Home Access: Ramped entrance (per pt no steps, per chart, ramped entrance)     Home Layout: One level Home Equipment: Walker - 2 wheels;Cane - single point;Shower seat;Adaptive equipment  Prior Function Level of Independence: Needs assistance   Gait / Transfers Assistance Needed: pt reports using SPC for  mobility  ADL's / Homemaking Assistance Needed: Pt's sister manages meds, meals, cleaning, assists pt with button up shirts, and drives pt; pt takes seated showers  Comments: Pt denies falls in past 12 months.     Hand Dominance   Dominant Hand: Right    Extremity/Trunk Assessment   Upper Extremity Assessment Upper Extremity Assessment: Generalized weakness    Lower Extremity Assessment Lower Extremity Assessment: RLE deficits/detail;LLE deficits/detail RLE Deficits / Details: ROM is WFL; intact light touch sensation; strength grossly 3/5  LLE Deficits / Details: ROM is WFL; intact light touch sensation; strength grossly 3/5     Cervical / Trunk Assessment Cervical / Trunk Assessment: Normal  Communication   Communication: No difficulties  Cognition Arousal/Alertness: Lethargic Behavior During Therapy: WFL for tasks assessed/performed Overall Cognitive Status: Within Functional Limits for tasks assessed                                 General Comments: pt lethargic/fatigued, requires verbal cues to attend to questions/tasks, but appropriate with responses, oriented to self, place, and situation (not oriented to day of the week but knew it was the 9th month and knew it was Labor Day); pt reports unable to read      General Comments      Exercises     Assessment/Plan    PT Assessment Patient needs continued PT services  PT Problem List Decreased strength;Decreased mobility;Decreased safety awareness;Decreased activity tolerance;Decreased balance       PT Treatment Interventions Gait training;Functional mobility training;Neuromuscular re-education;Balance training;Therapeutic exercise;Therapeutic activities;Patient/family education    PT Goals (Current goals can be found in the Care Plan section)  Acute Rehab PT Goals Patient Stated Goal: "I want to go home."  PT Goal Formulation: With patient Time For Goal Achievement: 05/30/17 Potential to Achieve  Goals: Fair    Frequency Min 2X/week   Barriers to discharge Decreased caregiver support lives alone; sister comes by to help with meds but unsure if she can help with ADLs;     Co-evaluation               AM-PAC PT "6 Clicks" Daily Activity  Outcome Measure Difficulty turning over in bed (including adjusting bedclothes, sheets and blankets)?: A Lot Difficulty moving from lying on back to sitting on the side of the bed? : Unable Difficulty sitting down on and standing up from a chair with arms (e.g., wheelchair, bedside commode, etc,.)?: A Lot Help needed moving to and from a bed to chair (including a wheelchair)?: A Little Help needed walking in hospital room?: A Little Help needed climbing 3-5 steps with a railing? : Total 6 Click Score: 12    End of Session Equipment Utilized During Treatment: Gait belt Activity Tolerance: Patient limited by fatigue Patient left: in bed;with nursing/sitter in room Nurse Communication: Mobility status PT Visit Diagnosis: Unsteadiness on feet (R26.81);Muscle weakness (generalized) (M62.81)    Time: [id number] PT Time Calculation (min) (ACUTE ONLY): 27 min   Charges:   PT Evaluation $PT Eval Low Complexity: 1 Low     PT G Codes:   PT G-Codes **NOT FOR INPATIENT CLASS** Functional Assessment Tool Used: AM-PAC 6 Clicks Basic Mobility;Clinical judgement Functional Limitation: Mobility: Walking and moving around Mobility: Walking and Moving Around Current Status (W1191(G8978): At least 60 percent but less than 80  percent impaired, limited or restricted Mobility: Walking and Moving Around Goal Status [phone]): At least 40 percent but less than 60 percent impaired, limited or restricted      Oris Calmes PT, DPT 05/16/2017, 1:30 PM

## 2017-05-16 NOTE — Clinical Social Work Note (Signed)
CSW attempted to meet with patient to complete assessment to discuss SNF placement.  Patient was not available, will attempt to see him at a later time.  Ervin KnackEric R. Brenna Friesenhahn, MSW, Theresia MajorsLCSWA [phone]  05/16/2017 3:52 PM

## 2017-05-16 NOTE — NC FL2 (Signed)
[place] MEDICAID FL2 LEVEL OF CARE SCREENING TOOL     IDENTIFICATION  Patient Name: Eric Yu Birthdate: [birth year] Sex: male Admission Date (Current Location): 05/14/2017  [place]   Facility and Address:  [facility], [address], [place], [place] [address]      Provider Number: [id number]  Attending Physician Name and Address:  Adrian SaranMody, Sital, MD  Relative Name and Phone Number:  Darlin CocoLloyd,[name] J Sister [phone]  [phone]     Current Level of Care: Hospital Recommended Level of Care: Skilled Nursing Facility Prior Approval Number:    Date Approved/Denied:   PASRR Number: [id number]  Discharge Plan: SNF    Current Diagnoses: Patient Active Problem List   Diagnosis Date Noted  . Sinus bradycardia 05/14/2017  . ESRD on hemodialysis (HCC)   . Goals of care, counseling/discussion   . Hypertensive crisis   . Acute pulmonary edema (HCC)   . Complete heart block (HCC) 04/09/2017  . Hyperkalemia 07/29/2016  . Physical debility   . Palliative care by specialist   . DNR (do not resuscitate)   . ESRD (end stage renal disease) on dialysis (HCC)   . Symptomatic anemia 05/08/2016  . Severe major depression, single episode (HCC) 04/28/2016  . Injury of kidney 01/08/2015  . Blood loss, postoperative 01/06/2015  . Hypertensive left ventricular hypertrophy 12/26/2014  . Gout 01/16/2014  . Benign hypertension 05/19/2012  . Current tobacco use 05/19/2012    Orientation RESPIRATION BLADDER Height & Weight     Self, Time, Situation, Place  O2 (3L) Continent Weight: 155 lb 9.6 oz (70.6 kg) Height:  5\' 8"  (172.7 cm)  BEHAVIORAL SYMPTOMS/MOOD NEUROLOGICAL BOWEL NUTRITION STATUS      Continent Diet (Renal diet)  AMBULATORY STATUS COMMUNICATION OF NEEDS Skin   Limited Assist Verbally                         Personal Care Assistance Level of Assistance  Bathing, Feeding, Dressing Bathing Assistance: Limited  assistance Feeding assistance: Limited assistance Dressing Assistance: Limited assistance     Functional Limitations Info  Sight, Hearing, Speech Sight Info: Adequate Hearing Info: Adequate Speech Info: Adequate    SPECIAL CARE FACTORS FREQUENCY  PT (By licensed PT), OT (By licensed OT)     PT Frequency: 5x a week OT Frequency: 5x a week            Contractures Contractures Info: Not present    Additional Factors Info  Code Status, Allergies Code Status Info: DNR Allergies Info: CEPHALEXIN           Current Medications (05/16/2017):  This is the current hospital active medication list Current Facility-Administered Medications  Medication Dose Route Frequency Provider Last Rate Last Dose  . acetaminophen (TYLENOL) tablet 650 mg  650 mg Oral Q6H PRN Katha HammingKonidena, Snehalatha, MD       Or  . acetaminophen (TYLENOL) suppository 650 mg  650 mg Rectal Q6H PRN Katha HammingKonidena, Snehalatha, MD      . albuterol (PROVENTIL) (2.5 MG/3ML) 0.083% nebulizer solution 3 mL  3 mL Inhalation Dorrene GermanBH-q7a Konidena, Madelyn FlavorsSnehalatha, MD   3 mL at 05/16/17 0730  . alteplase (CATHFLO ACTIVASE) injection 2 mg  2 mg Intracatheter Once PRN Lateef, Munsoor, MD      . aspirin EC tablet 81 mg  81 mg Oral Dorrene GermanBH-q7a Konidena, Madelyn FlavorsSnehalatha, MD   81 mg at 05/16/17 0908  . bisacodyl (DULCOLAX) EC tablet 5 mg  5 mg  Oral Daily PRN Katha Hamming, MD      . docusate sodium (COLACE) capsule 100 mg  100 mg Oral BID Katha Hamming, MD   100 mg at 05/16/17 0908  . epoetin alfa (EPOGEN,PROCRIT) injection 4,000 Units  4,000 Units Intravenous Q M,W,F-HD Cherylann Ratel, Munsoor, MD   4,000 Units at 05/16/17 1425  . heparin injection 1,000 Units  1,000 Units Dialysis PRN Lateef, Munsoor, MD      . heparin injection 5,000 Units  5,000 Units Subcutaneous Q8H Katha Hamming, MD   5,000 Units at 05/16/17 0908  . hydrALAZINE (APRESOLINE) tablet 100 mg  100 mg Oral TID Katha Hamming, MD   100 mg at 05/16/17 0908  .  HYDROcodone-acetaminophen (NORCO/VICODIN) 5-325 MG per tablet 1-2 tablet  1-2 tablet Oral Q4H PRN Katha Hamming, MD   2 tablet at 05/15/17 1630  . losartan (COZAAR) tablet 100 mg  100 mg Oral QHS Katha Hamming, MD   100 mg at 05/15/17 2213  . ondansetron (ZOFRAN) tablet 4 mg  4 mg Oral Q6H PRN Katha Hamming, MD       Or  . ondansetron (ZOFRAN) injection 4 mg  4 mg Intravenous Q6H PRN Katha Hamming, MD      . traZODone (DESYREL) tablet 25 mg  25 mg Oral QHS PRN Katha Hamming, MD         Discharge Medications: Please see discharge summary for a list of discharge medications.  Relevant Imaging Results:  Relevant Lab Results:   Additional Information SS# [id number]  [place]

## 2017-05-16 NOTE — Progress Notes (Signed)
  End of hd 

## 2017-05-17 ENCOUNTER — Inpatient Hospital Stay: Payer: Medicare HMO

## 2017-05-17 LAB — URINE CULTURE: Culture: <10000 — AB

## 2017-05-17 LAB — BASIC METABOLIC PANEL
ANION GAP: 9 (ref 5–15)
BUN: 20 mg/dL (ref 6–20)
CHLORIDE: 102 mmol/L (ref 101–111)
CO2: 29 mmol/L (ref 22–32)
Calcium: 8.8 mg/dL — ABNORMAL LOW (ref 8.9–10.3)
Creatinine, Ser: 3.89 mg/dL — ABNORMAL HIGH (ref 0.61–1.24)
GFR calc non Af Amer: 13 mL/min — ABNORMAL LOW (ref >60)
GFR, EST AFRICAN AMERICAN: 15 mL/min — AB (ref >60)
GLUCOSE: 79 mg/dL (ref 65–99)
Potassium: 3.9 mmol/L (ref 3.5–5.1)
Sodium: 140 mmol/L (ref 135–145)

## 2017-05-17 LAB — PARATHYROID HORMONE, INTACT (NO CA): PTH: 339 pg/mL — ABNORMAL HIGH (ref 15–65)

## 2017-05-17 LAB — GLUCOSE, CAPILLARY: Glucose-Capillary: 67 mg/dL (ref 65–99)

## 2017-05-17 MED ORDER — ALBUTEROL SULFATE (2.5 MG/3ML) 0.083% IN NEBU
3.0000 mL | INHALATION_SOLUTION | Freq: Every day | RESPIRATORY_TRACT | Status: DC
  Administered 2017-05-18: 3 mL via RESPIRATORY_TRACT
  Filled 2017-05-17: qty 3

## 2017-05-17 NOTE — Progress Notes (Signed)
SUBJECTIVE: Patient is doing better   Vitals:   05/17/17 0513 05/17/17 0751 05/17/17 0825 05/17/17 1138  BP: (!) 154/54  (!) 167/59 (!) 157/59  Pulse: 69 68 73 68  Resp: 18  19 18   Temp: 98.3 F (36.8 C)  98.1 F (36.7 C) 99.1 F (37.3 C)  TempSrc: Oral  Oral Oral  SpO2: 100% 97% (!) 88% 98%  Weight: 155 lb 12.8 oz (70.7 kg)     Height:        Intake/Output Summary (Last 24 hours) at 05/17/17 1203 Last data filed at 05/17/17 1011  Gross per 24 hour  Intake              480 ml  Output             1200 ml  Net             -720 ml    LABS: Basic Metabolic Panel:  Recent Labs  16/06/9608/01/18 1629 05/14/17 1915 05/15/17 0529 05/16/17 1419 05/17/17 0513  NA 137  --  140  --  140  K 5.4*  --  3.9  --  3.9  CL 107  --  102  --  102  CO2 17*  --  29  --  29  GLUCOSE 116*  --  80  --  79  BUN 88*  --  34*  --  20  CREATININE 9.08*  --  4.67*  --  3.89*  CALCIUM 9.3  --  8.2*  --  8.8*  MG 2.4  --   --   --   --   PHOS  --  4.5  --  3.6  --    Liver Function Tests:  Recent Labs  05/14/17 1629  AST 14*  ALT 8*  ALKPHOS 76  BILITOT 0.7  PROT 7.1  ALBUMIN 3.6   No results for input(s): LIPASE, AMYLASE in the last 72 hours. CBC:  Recent Labs  05/14/17 1629 05/15/17 0529  WBC 5.5 4.7  NEUTROABS 4.0  --   HGB 8.9* 7.9*  HCT 26.9* 23.8*  MCV 88.4 88.1  PLT 202 171   Cardiac Enzymes:  Recent Labs  05/14/17 1629  TROPONINI 0.06*   BNP: Invalid input(s): POCBNP D-Dimer: No results for input(s): DDIMER in the last 72 hours. Hemoglobin A1C: No results for input(s): HGBA1C in the last 72 hours. Fasting Lipid Panel: No results for input(s): CHOL, HDL, LDLCALC, TRIG, CHOLHDL, LDLDIRECT in the last 72 hours. Thyroid Function Tests: No results for input(s): TSH, T4TOTAL, T3FREE, THYROIDAB in the last 72 hours.  Invalid input(s): FREET3 Anemia Panel: No results for input(s): VITAMINB12, FOLATE, FERRITIN, TIBC, IRON, RETICCTPCT in the last 72  hours.   PHYSICAL EXAM General: Well developed, well nourished, in no acute distress HEENT:  Normocephalic and atramatic Neck:  No JVD.  Lungs: Clear bilaterally to auscultation and percussion. Heart: HRRR . Normal S1 and S2 without gallops or murmurs.  Abdomen: Bowel sounds are positive, abdomen soft and non-tender  Msk:  Back normal, normal gait. Normal strength and tone for age. Extremities: No clubbing, cyanosis or edema.   Neuro: Alert and oriented X 3. Psych:  Good affect, responds appropriately   ASSESSMENT AND PLAN:S/P Severe Bradycardia due to non-compliance with HD, and feeling much better with HR 70/min. He doesnot want PPM and is DNR thus monitoring stopped. Active Problems:   Sinus bradycardia    Kayda Allers A, MD, [facility] 05/17/2017 12:03 PM

## 2017-05-17 NOTE — Progress Notes (Signed)
[name]  ROUNDING NOTE   Subjective:   Hemodialysis yesterday. Tolerated treatment well. UF of 1 liter.   Continues to require oxygen.    Objective:  Vital signs in last 24 hours:  Temp:  [97.5 F (36.4 C)-99.1 F (37.3 C)] 99.1 F (37.3 C) (09/04 1138) Pulse Rate:  [65-74] 68 (09/04 1138) Resp:  [14-22] 18 (09/04 1138) BP: (154-182)/(54-85) 157/59 (09/04 1138) SpO2:  [88 %-100 %] 98 % (09/04 1138) FiO2 (%):  [32 %] 32 % (09/04 0801) Weight:  [70.7 kg (155 lb 12.8 oz)] 70.7 kg (155 lb 12.8 oz) (09/04 0513)  Weight change: 0.091 kg (3.2 oz) Filed Weights   05/15/17 0415 05/16/17 0513 05/17/17 0513  Weight: 72.3 kg (159 lb 4.8 oz) 70.6 kg (155 lb 9.6 oz) 70.7 kg (155 lb 12.8 oz)    Intake/Output: I/O last 3 completed shifts: In: 480 [P.O.:480] Out: 1300 [Urine:300; Other:1000]   Intake/Output this shift:  Total I/O In: 240 [P.O.:240] Out: -   Physical Exam: General: Chronically ill-appearing   Head: Normocephalic, atraumatic. Moist oral mucosal membranes  Eyes: Anicteric  Neck: Supple, trachea midline  Lungs:  Clear to auscultation, normal effort  Heart: S1S2 no rubs  Abdomen:  Soft, nontender, bowel sounds present  Extremities: No peripheral edema.  Neurologic: Awake, alert, following commands  Skin: No lesions  Access: Left upper extremity AV fistula     Basic Metabolic Panel:  Recent Labs Lab 05/14/17 1629 05/14/17 1915 05/15/17 0529 05/16/17 1419 05/17/17 0513  NA 137  --  140  --  140  K 5.4*  --  3.9  --  3.9  CL 107  --  102  --  102  CO2 17*  --  29  --  29  GLUCOSE 116*  --  80  --  79  BUN 88*  --  34*  --  20  CREATININE 9.08*  --  4.67*  --  3.89*  CALCIUM 9.3  --  8.2*  --  8.8*  MG 2.4  --   --   --   --   PHOS  --  4.5  --  3.6  --     Liver Function Tests:  Recent Labs Lab 05/14/17 1629  AST 14*  ALT 8*  ALKPHOS 76  BILITOT 0.7  PROT 7.1  ALBUMIN 3.6   No results for input(s): LIPASE, AMYLASE in the  last 168 hours. No results for input(s): AMMONIA in the last 168 hours.  CBC:  Recent Labs Lab 05/14/17 1629 05/15/17 0529  WBC 5.5 4.7  NEUTROABS 4.0  --   HGB 8.9* 7.9*  HCT 26.9* 23.8*  MCV 88.4 88.1  PLT 202 171    Cardiac Enzymes:  Recent Labs Lab 05/14/17 1629  TROPONINI 0.06*    BNP: Invalid input(s): POCBNP  CBG:  Recent Labs Lab 05/15/17 0756 05/16/17 0813 05/16/17 1149 05/16/17 2046 05/17/17 0754  GLUCAP 81 76 87 96 67    Microbiology: Results for orders placed or performed during the hospital encounter of 05/14/17  MRSA PCR Screening     Status: None   Collection Time: 05/14/17 11:39 PM  Result Value Ref Range Status   MRSA by PCR NEGATIVE NEGATIVE Final    Comment:        The GeneXpert MRSA Assay (FDA approved for NASAL specimens only), is one component of a comprehensive MRSA colonization surveillance program. It is not intended to diagnose MRSA infection nor to guide or monitor  treatment for MRSA infections.   Urine culture     Status: Abnormal   Collection Time: 05/16/17  5:24 AM  Result Value Ref Range Status   Specimen Description URINE, RANDOM  Final   Special Requests NONE  Final   Culture (A)  Final    <10,000 COLONIES/mL INSIGNIFICANT GROWTH Performed at [facility], [address], [place], [place] [address]    Report Status 05/17/2017 FINAL  Final    Coagulation Studies: No results for input(s): LABPROT, INR in the last 72 hours.  Urinalysis:  Recent Labs  05/16/17 0524  COLORURINE YELLOW*  LABSPEC 1.011  PHURINE 7.0  GLUCOSEU NEGATIVE  HGBUR NEGATIVE  BILIRUBINUR NEGATIVE  KETONESUR NEGATIVE  PROTEINUR 100*  NITRITE NEGATIVE  LEUKOCYTESUR NEGATIVE      Imaging: Dg Chest 1 View  Result Date: 05/17/2017 CLINICAL DATA:  CHF EXAM: CHEST 1 VIEW COMPARISON:  Three days ago FINDINGS: Unchanged cardiomegaly. Haziness of the lower chest from small pleural effusions. Interstitial opacities bilaterally. No  air bronchogram or pneumothorax. Stable aortic and hilar contours. IMPRESSION: CHF pattern that is unchanged from 3 days ago. Electronically Signed   By: Marnee Spring M.D.   On: 05/17/2017 08:46     Medications:    . [START ON 05/18/2017] albuterol  3 mL Inhalation Daily  . aspirin EC  81 mg Oral BH-q7a  . docusate sodium  100 mg Oral BID  . epoetin (EPOGEN/PROCRIT) injection  4,000 Units Intravenous Q M,W,F-HD  . heparin  5,000 Units Subcutaneous Q8H  . hydrALAZINE  100 mg Oral TID  . losartan  100 mg Oral QHS   acetaminophen **OR** acetaminophen, alteplase, bisacodyl, heparin, HYDROcodone-acetaminophen, ondansetron **OR** ondansetron (ZOFRAN) IV, traZODone  Assessment/ Plan:  83 y.o.black male with end-stage renal disease on hemodialysis, hypertension, depression, COPD, history of gout, chronic anemia who presents with bradycardia, missed dialysis 1 week. Patient presented similarly in July. He lives by himself.  MWF [facility] left [facility].   1. End-stage renal disease: MWF. Hemodialysis treatment yesterday. Tolerated treatment well. No acute indication for dialysis today.  - Plan on hemodialysis again tomorrow. Resume MWF schedule  2. Anemia of chronic kidney disease: Hemoglobin 7.9 - EPO with HD treatment  3. Secondary hyperparathyroidism: phosphorus and calcium at goal. Not currently on a binder. PTH at goal, 339.   4. Hypertension: elevated.  - Continue losartan and hydralazine.   LOS: 3 Marsella Suman 9/4/20181:27 PM

## 2017-05-17 NOTE — Care Management (Signed)
patient is agreeable to go to skilled nursing.  Informed attending will require authorization from insurance company.  CSW is involved.  Patient clarified to CM that he does not have a daughter that would have helped him with medicaid application as reported by [employer]. "It must have been my sister."  Says has "looked into" medicaid in the past but unable to say when or if process was completed.  If [facility]- will need assist with medicaid application and not leave it up to patient nor his sister to complete

## 2017-05-17 NOTE — Progress Notes (Signed)
Notifed by [facility] of expedited appeal with case number as noted [id number]. The CM department will facilitate faxing records to them by noon tomorrow. Appropriateness of DC decision can take up to 72 hours.

## 2017-05-17 NOTE — Clinical Social Work Placement (Signed)
   CLINICAL SOCIAL WORK PLACEMENT  NOTE  Date:  05/17/2017  Patient Details  Name: Eric Yu MRN: [id number] Date of Birth: [birth year]  Clinical Social Work is seeking post-discharge placement for this patient at the Skilled  Nursing Facility level of care (*CSW will initial, date and re-position this form in  chart as items are completed):  Yes   Patient/family provided with [place] Clinical Social Work Department's list of facilities offering this level of care within the geographic area requested by the patient (or if unable, by the patient's family).  Yes   Patient/family informed of their freedom to choose among providers that offer the needed level of care, that participate in Medicare, Medicaid or managed care program needed by the patient, have an available bed and are willing to accept the patient.  Yes   Patient/family informed of [facility]'s ownership interest in [facility] and [facility], as well as of the fact that they are under no obligation to receive care at these facilities.  PASRR submitted to EDS on 05/17/17     PASRR number received on       Existing PASRR number confirmed on 05/17/17     FL2 transmitted to all facilities in geographic area requested by pt/family on 05/17/17     FL2 transmitted to all facilities within larger geographic area on       Patient informed that his/her managed care company has contracts with or will negotiate with certain facilities, including the following:        Yes   Patient/family informed of bed offers received.  [facility]     Physician recommends and patient chooses bed at      [facility] on  .  Patient [facility] be [facility]     Patient family notified on   of transfer.  Name of family member notified:        PHYSICIAN       Additional Comment:     _______________________________________________ [name], [name], [name] 05/17/2017, 4:52 PM

## 2017-05-17 NOTE — Clinical Social Work Note (Addendum)
CSW received insurance authorization for patient to go to SNF for short term rehab.  Authorization number is [id number], insurance case manager is Jacki ConesLaurie at [phone] extension [phone].  Patient to be d/c'ed today to [facility].  Patient and family agreeable to plans will transport via ems RN to call report to Elly ModenaKathy Rogers room [phone].  4:30pm  Patient expressed he does not feel like he is well enough to discharge to SNF.  He is appealing his discharge, case manager was made aware.  CSW updated patient's sister, [facility] and insurance company.  Eric Yu, MSW, Theresia MajorsLCSWA [phone]

## 2017-05-17 NOTE — Clinical Social Work Note (Signed)
Clinical Social Work Assessment  Patient Details  Name: Lorra HalsJohn Cicio MRN: [id number] Date of Birth: 1934-01-05  Date of referral:  05/17/17               Reason for consult:  Facility Placement                Permission sought to share information with:  Family Supports, Magazine features editoracility Contact Representative Permission granted to share information::  Yes, Verbal Permission Granted  Name::     Nathaniel ManLloyd,[name] J Sister [phone]  [phone]   Agency::     Relationship::     Contact Information:     Housing/Transportation Living arrangements for the past 2 months:  Single Family Home Source of Information:  Patient Patient Interpreter Needed:  None Criminal Activity/Legal Involvement Pertinent to Current Situation/Hospitalization:  No - Comment as needed Significant Relationships:  Other Family Members Lives with:  Self Do you feel safe going back to the place where you live?  No Need for family participation in patient care:  Yes (Comment)  Care giving concerns:  Patient feels he needs short term rehab before he is able to return back home.   Social Worker assessment / plan:  Patient is an 83 year old male who is alert and oriented x3.  Patient states he lives alone, and his sister is involved in his life.  Patient states he has been to rehab in the past, CSW informed patient of what to expect at SNF and process for looking for placement.  CSW explained how insurance will pay for stay at SNF, and process of getting insurance authorization.  Patient requested that CSW also speak with his sister [name] to discuss SNF options.  Patient and his sister did not express any other questions or concerns.  CSW was given permission to [place].  Employment status:  Retired Database administratornsurance information:  Managed Medicare PT Recommendations:  Skilled Nursing Facility Information / Referral to community resources:  Skilled Nursing Facility  Patient/Family's Response to care:  Patient is  in agreement to going to SNF for short term rehab.  Patient/Family's Understanding of and Emotional Response to Diagnosis, Current Treatment, and Prognosis:  Patient expressed that he is hopeful that he will not be in SNF for very long.  Emotional Assessment Appearance:  Appears stated age Attitude/Demeanor/Rapport:    Affect (typically observed):  Appropriate, Calm Orientation:  Oriented to Self, Oriented to Place, Oriented to  Time Alcohol / Substance use:  Not Applicable Psych involvement (Current and /or in the community):  No (Comment)  Discharge Needs  Concerns to be addressed:  Lack of Support Readmission within the last 30 days:  No Current discharge risk:  Lack of support system, Lives alone Barriers to Discharge:  English as a second language teachernsurance Authorization, Continued Medical Work up   [place] [employer], Talisha Erby R, [name] 05/17/2017, 4:37 PM

## 2017-05-17 NOTE — Care Management Important Message (Signed)
Important Message  Patient Details  Name: Eric Yu MRN: [id number] Date of Birth: [birth year]   Medicare Important Message Given:  Yes Signed IM notice given    [name], Milinda Sweeney R, RN 05/17/2017, 11:30 AM

## 2017-05-17 NOTE — Care Management (Signed)
Patient has decided to appeal the discharge.  Call has been made to [employer] and [facility], [facility], Mayford KnifeSandy Holland, attending and [employer] administrative assistants.  Patient states he is appealing due to feeling "so weak I cain't do nothing."  CM discussed with patient that this is the reason for skilled nursing placement and he becomes tearful.  CM assisted patient with the call to [employer] and obtained case [id number].

## 2017-05-17 NOTE — Progress Notes (Signed)
[facility] [place] [facility]   PATIENT NAME: Eric Yu    MR#:  [id number]  DATE OF BIRTH:  1934/06/19  SUBJECTIVE:   Patient Is 88% on room air. Chest x-ray today still shows some fluid. He is agreeable to short-term rehabilitation. His weakness has improved since admission. He is denying shortness of breath or chest pain this morning. REVIEW OF SYSTEMS:    Review of Systems  Constitutional: Negative for fever, chills weight loss Generalized weakness HENT: Negative for ear pain, nosebleeds, congestion, facial swelling, rhinorrhea, neck pain, neck stiffness and ear discharge.   Respiratory: Negative for cough, shortness of breath, wheezing  Cardiovascular: Negative for chest pain, palpitations and leg swelling.  Gastrointestinal: Negative for heartburn, abdominal pain, vomiting, diarrhea or consitpation Genitourinary: Negative for dysuria, urgency, frequency, hematuria Musculoskeletal: Negative for back pain or joint pain Neurological: Negative for dizziness, seizures, syncope, focal weakness,  numbness and headaches.  Hematological: Does not bruise/bleed easily.  Psychiatric/Behavioral: Negative for hallucinations, confusion, dysphoric mood    Tolerating Diet: yes      DRUG ALLERGIES:   Allergies  Allergen Reactions  . Cephalexin     Other reaction(s): Other (See Comments) Gross hematuria - occurred once. Not certain whether it was coincidence or causative.    VITALS:  Blood pressure (!) 167/59, pulse 73, temperature 98.1 F (36.7 C), temperature source Oral, resp. rate 19, height 5\' 8"  (1.727 m), weight 70.7 kg (155 lb 12.8 oz), SpO2 (!) 88 %.  PHYSICAL EXAMINATION:  Constitutional: AppearsThin and frail. No distress. HENT: Normocephalic. Marland Kitchen. Oropharynx is clear and moist.  Eyes: Conjunctivae and EOM are normal. PERRLA, no scleral icterus.  Neck: Normal ROM. Neck supple. No JVD. No tracheal deviation. CVS: RRR, S1/S2 +, no murmurs, no  gallops, no carotid bruit.  Pulmonary: Bilateral rhonchi without wheezing or rales Abdominal: Soft. BS +,  no d istension, tenderness, rebound or guarding.  Musculoskeletal: Normal range of motion. No edema and no tenderness.  Neuro: Alert. CN 2-12 grossly intact. No focal deficits. Skin: Skin is warm and dry. No rash noted. hemodialysis catheter left arm  Psychiatric: Normal mood and affect.      LABORATORY PANEL:   CBC  Recent Labs Lab 05/15/17 0529  WBC 4.7  HGB 7.9*  HCT 23.8*  PLT 171   ------------------------------------------------------------------------------------------------------------------  Chemistries   Recent Labs Lab 05/14/17 1629  05/17/17 0513  NA 137  < > 140  K 5.4*  < > 3.9  CL 107  < > 102  CO2 17*  < > 29  GLUCOSE 116*  < > 79  BUN 88*  < > 20  CREATININE 9.08*  < > 3.89*  CALCIUM 9.3  < > 8.8*  MG 2.4  --   --   AST 14*  --   --   ALT 8*  --   --   ALKPHOS 76  --   --   BILITOT 0.7  --   --   < > = values in this interval not displayed. ------------------------------------------------------------------------------------------------------------------  Cardiac Enzymes  Recent Labs Lab 05/14/17 1629  TROPONINI 0.06*   ------------------------------------------------------------------------------------------------------------------  RADIOLOGY:  Dg Chest 1 View  Result Date: 05/17/2017 CLINICAL DATA:  CHF EXAM: CHEST 1 VIEW COMPARISON:  Three days ago FINDINGS: Unchanged cardiomegaly. Haziness of the lower chest from small pleural effusions. Interstitial opacities bilaterally. No air bronchogram or pneumothorax. Stable aortic and hilar contours. IMPRESSION: CHF pattern that is unchanged from 3 days ago. Electronically Signed  By: Marnee Spring M.D.   On: 05/17/2017 08:46     ASSESSMENT AND PLAN:   83 year old male with history of end-stage renal disease on hemodialysis who presented with generalized weakness and hyperkalemia and  noted to have bradycardia.  1. Symptomatic bradycardia which resolved after hemodialysis and thought to be due to hyperkalemia.  2. Generalized weakness: Physical therapy RECS SNF.  3. Hyperkalemia: This has resolved after dialysis was completed yesterday.  4. End-stage renal disease on hemodialysis: Consultation from nephrology is appreciated.  Patient will receive another session of dialysis today. Case discussed with nephrologist today   5. HTN Continue hydralazine and losartan.  6. Acute hypoxic resp failure: This is due to fluid overload from end-stage renal disease. I spoke with nephrologist and patient will have another dialysis session today.      Management plans discussed with the patient and he is in agreement.  CODE STATUS: dnr  TOTAL TIME TAKING CARE OF THIS PATIENT: 24 minutes.     POSSIBLE D/C today, DEPENDING ON CLINICAL CONDITION.   Eric Yu M.D on 05/17/2017 at 9:05 AM  Between 7am to 6pm - Pager - [phone] After 6pm go to [email or web address] - Social research officer, government  [facility]  Office  [phone]  CC: Primary care physician; Eric Regulus, MD  Note: This dictation was prepared with Dragon dictation along with smaller phrase technology. Any transcriptional errors that result from this process are unintentional.

## 2017-05-17 NOTE — Discharge Summary (Addendum)
[facility] [place] at [facility]   PATIENT NAME: Eric Yu    MR#:  [id number]  DATE OF BIRTH:  August 20, 1934  DATE OF ADMISSION:  05/14/2017 ADMITTING PHYSICIAN: Katha Hamming, MD  DATE OF DISCHARGE: 05/17/2017  PRIMARY CARE PHYSICIAN: Lauro Regulus, MD    ADMISSION DIAGNOSIS:  Weakness  DISCHARGE DIAGNOSIS:  Active Problems:   Sinus bradycardia   SECONDARY DIAGNOSIS:   Past Medical History:  Diagnosis Date  . Anemia   . Chronic kidney disease   . COPD (chronic obstructive pulmonary disease) (HCC)   . Depression   . Gout   . Hypertension   . Iron deficiency     HOSPITAL COURSE:   83 year old male with history of end-stage renal disease on hemodialysis who presented with generalized weakness and hyperkalemia and noted to have bradycardia.  1. Symptomatic bradycardia which resolved after hemodialysis and thought to be due to hyperkalemia.  2. Generalized weakness: Physical therapy RECS SNF.  3. Hyperkalemia: This has resolved after dialysis was completed yesterday.  4. End-stage renal disease on hemodialysis: Consultation from nephrology is appreciated.  Patient received dialysis on the hospital  5. HTN Continue hydralazine and losartan.  6. Acute hypoxic resp failure: This is due to fluid overload from end-stage renal disease. 7. Acute bronchitis: Patient is started on Levaquin and will complete course of therapy in 3 days.  DISCHARGE CONDITIONS AND DIET:   Stable renal diet  CONSULTS OBTAINED:  Treatment Team:  Laurier Nancy, MD  DRUG ALLERGIES:   Allergies  Allergen Reactions  . Cephalexin     Other reaction(s): Other (See Comments) Gross hematuria - occurred once. Not certain whether it was coincidence or causative.    DISCHARGE MEDICATIONS:   Current Discharge Medication List    START taking these medications   Details  levofloxacin (LEVAQUIN) 500 MG tablet Take 1 tablet (500 mg total) by mouth every  other day. Start on Friday Qty: 1 tablet, Refills: 0      CONTINUE these medications which have NOT CHANGED   Details  albuterol (PROVENTIL HFA;VENTOLIN HFA) 108 (90 Base) MCG/ACT inhaler Inhale 2 puffs into the lungs every morning.     amLODipine (NORVASC) 10 MG tablet Take 1 tablet (10 mg total) by mouth every morning. Qty: 30 tablet, Refills: 0    aspirin EC 81 MG tablet Take 81 mg by mouth every morning.     calcium acetate (PHOSLO) 667 MG capsule Take 667 mg by mouth 3 (three) times daily with meals.     hydrALAZINE (APRESOLINE) 100 MG tablet Take 1 tablet (100 mg total) by mouth 3 (three) times daily. Qty: 90 tablet, Refills: 1    lidocaine-prilocaine (EMLA) cream Apply 1 application topically every dialysis.    losartan (COZAAR) 100 MG tablet Take 1 tablet (100 mg total) by mouth at bedtime. Qty: 30 tablet, Refills: 1    senna-docusate (SENOKOT-S) 8.6-50 MG tablet Take 1 tablet by mouth at bedtime as needed for mild constipation. Qty: 30 tablet, Refills: 0    torsemide (DEMADEX) 20 MG tablet Take 1 tablet (20 mg total) by mouth daily. Qty: 30 tablet, Refills: 0          Today   CHIEF COMPLAINT:   Denies shortness of breath or PND   VITAL SIGNS:  Blood pressure (!) 170/75, pulse 75, temperature 98 F (36.7 C), temperature source Oral, resp. rate 19, height 5\' 8"  (1.727 m), weight 72.3 kg (159 lb 6.4 oz), SpO2 100 %.  REVIEW OF SYSTEMS:  Review of Systems  Constitutional: Positive for malaise/fatigue. Negative for chills and fever.  HENT: Negative.  Negative for ear discharge, ear pain, hearing loss, nosebleeds and sore throat.   Eyes: Negative.  Negative for blurred vision and pain.  Respiratory: Positive for cough. Negative for hemoptysis, shortness of breath and wheezing.   Cardiovascular: Negative.  Negative for chest pain, palpitations and leg swelling.  Gastrointestinal: Negative.  Negative for abdominal pain, blood in stool, diarrhea, nausea and  vomiting.  Genitourinary: Negative.  Negative for dysuria.  Musculoskeletal: Negative.  Negative for back pain.  Skin: Negative.   Neurological: Positive for weakness. Negative for dizziness, tremors, speech change, focal weakness, seizures and headaches.  Endo/Heme/Allergies: Negative.  Does not bruise/bleed easily.  Psychiatric/Behavioral: Negative.  Negative for depression, hallucinations and suicidal ideas.     PHYSICAL EXAMINATION:  GENERAL:  83 y.o.-year-old patient lying in the bed with no acute distress.  NECK:  Supple, no jugular venous distention. No thyroid enlargement, no tenderness.  LUNGS: Bilateral,rhonchi  No use of accessory muscles of respiration.  CARDIOVASCULAR: S1, S2 normal. No murmurs, rubs, or gallops.  ABDOMEN: Soft, non-tender, non-distended. Bowel sounds present. No organomegaly or mass.  EXTREMITIES: No pedal edema, cyanosis, or clubbing.  PSYCHIATRIC: The patient is alert and oriented x 3.  SKIN: No obvious rash, lesion, or ulcer.   DATA REVIEW:   CBC  Recent Labs Lab 05/18/17 0459  WBC 5.1  HGB 8.1*  HCT 24.6*  PLT 175    Chemistries   Recent Labs Lab 05/14/17 1629  05/17/17 0513  NA 137  < > 140  K 5.4*  < > 3.9  CL 107  < > 102  CO2 17*  < > 29  GLUCOSE 116*  < > 79  BUN 88*  < > 20  CREATININE 9.08*  < > 3.89*  CALCIUM 9.3  < > 8.8*  MG 2.4  --   --   AST 14*  --   --   ALT 8*  --   --   ALKPHOS 76  --   --   BILITOT 0.7  --   --   < > = values in this interval not displayed.  Cardiac Enzymes  Recent Labs Lab 05/14/17 1629  TROPONINI 0.06*    Microbiology Results  @MICRORSLT48 @  RADIOLOGY:  Dg Chest 1 View  Result Date: 05/17/2017 CLINICAL DATA:  CHF EXAM: CHEST 1 VIEW COMPARISON:  Three days ago FINDINGS: Unchanged cardiomegaly. Haziness of the lower chest from small pleural effusions. Interstitial opacities bilaterally. No air bronchogram or pneumothorax. Stable aortic and hilar contours. IMPRESSION: CHF pattern  that is unchanged from 3 days ago. Electronically Signed   By: Marnee Spring M.D.   On: 05/17/2017 08:46      Current Discharge Medication List    START taking these medications   Details  levofloxacin (LEVAQUIN) 500 MG tablet Take 1 tablet (500 mg total) by mouth every other day. Start on Friday Qty: 1 tablet, Refills: 0      CONTINUE these medications which have NOT CHANGED   Details  albuterol (PROVENTIL HFA;VENTOLIN HFA) 108 (90 Base) MCG/ACT inhaler Inhale 2 puffs into the lungs every morning.     amLODipine (NORVASC) 10 MG tablet Take 1 tablet (10 mg total) by mouth every morning. Qty: 30 tablet, Refills: 0    aspirin EC 81 MG tablet Take 81 mg by mouth every morning.     calcium acetate (PHOSLO) 667 MG  capsule Take 667 mg by mouth 3 (three) times daily with meals.     hydrALAZINE (APRESOLINE) 100 MG tablet Take 1 tablet (100 mg total) by mouth 3 (three) times daily. Qty: 90 tablet, Refills: 1    lidocaine-prilocaine (EMLA) cream Apply 1 application topically every dialysis.    losartan (COZAAR) 100 MG tablet Take 1 tablet (100 mg total) by mouth at bedtime. Qty: 30 tablet, Refills: 1    senna-docusate (SENOKOT-S) 8.6-50 MG tablet Take 1 tablet by mouth at bedtime as needed for mild constipation. Qty: 30 tablet, Refills: 0    torsemide (DEMADEX) 20 MG tablet Take 1 tablet (20 mg total) by mouth daily. Qty: 30 tablet, Refills: 0           Management plans discussed with the patient and he is in agreement. Stable for discharge   Patient should follow up with pcp  CODE STATUS:     Code Status Orders        Start     Ordered   05/15/17 0916  Do not attempt resuscitation (DNR)  Continuous    Question Answer Comment  In the event of cardiac or respiratory ARREST Do not call a "code blue"   In the event of cardiac or respiratory ARREST Do not perform Intubation, CPR, defibrillation or ACLS   In the event of cardiac or respiratory ARREST Use medication by  any route, position, wound care, and other measures to relive pain and suffering. May use oxygen, suction and manual treatment of airway obstruction as needed for comfort.      05/15/17 0915    Code Status History    Date Active Date Inactive Code Status Order ID Comments User Context   05/14/2017  6:50 PM 05/15/2017  9:15 AM Full Code [id number]  Katha HammingKonidena, Snehalatha, MD ED   04/09/2017 10:31 PM 04/13/2017  5:58 PM DNR [id number]  Lewie Loronukov, Magadalene S, NP Inpatient   04/09/2017  8:46 PM 04/09/2017 10:31 PM Full Code [id number]  Shaune Pollackhen, Qing, MD Inpatient   02/23/2017  2:23 PM 02/24/2017  7:29 PM Full Code [id number]  Enedina FinnerPatel, Sona, MD Inpatient   07/29/2016  2:56 PM 07/30/2016  7:44 PM Full Code [id number]  Enedina FinnerPatel, Sona, MD Inpatient   05/11/2016  8:58 AM 05/12/2016  1:33 AM DNR [id number]  Canary BrimLarach, Mary W, NP Inpatient   05/08/2016  1:54 PM 05/11/2016  8:58 AM Full Code [id number]  Milagros LollSudini, Srikar, MD ED   04/26/2016  7:06 PM 05/03/2016  6:47 PM Full Code [id number]  Adrian SaranMody, Jenicka Coxe, MD Inpatient    Advance Directive Documentation     Most Recent Value  Type of Advance Directive  Out of facility DNR (pink MOST or yellow form)  Pre-existing out of facility DNR order (yellow form or pink MOST form)  Yellow form placed in chart (order not valid for inpatient use)  "MOST" Form in Place?  -      TOTAL TIME TAKING CARE OF THIS PATIENT: 38 minutes.    Note: This dictation was prepared with Dragon dictation along with smaller phrase technology. Any transcriptional errors that result from this process are unintentional.  Rissie Sculley M.D on 05/18/2017 at 11:05 AM  Between 7am to 6pm - Pager - [phone] After 6pm go to [email or web address] - Social research officer, governmentpassword EPAS [facility]  [facility] [place] [facility]  Office  [phone]  CC: Primary care physician; Lauro RegulusAnderson, Marshall W, MD

## 2017-05-18 LAB — CBC
HEMATOCRIT: 24.6 % — AB (ref 40.0–52.0)
Hemoglobin: 8.1 g/dL — ABNORMAL LOW (ref 13.0–18.0)
MCH: 29.2 pg (ref 26.0–34.0)
MCHC: 33 g/dL (ref 32.0–36.0)
MCV: 88.2 fL (ref 80.0–100.0)
PLATELETS: 175 10*3/uL (ref 150–440)
RBC: 2.79 MIL/uL — ABNORMAL LOW (ref 4.40–5.90)
RDW: 13.9 % (ref 11.5–14.5)
WBC: 5.1 10*3/uL (ref 3.8–10.6)

## 2017-05-18 LAB — GLUCOSE, CAPILLARY: GLUCOSE-CAPILLARY: 77 mg/dL (ref 65–99)

## 2017-05-18 MED ORDER — LEVOFLOXACIN 500 MG PO TABS: 500.0000 mg | ORAL_TABLET | ORAL | 0 refills | Status: AC

## 2017-05-18 MED ORDER — EPOETIN ALFA 10000 UNIT/ML IJ SOLN
10000.0000 [IU] | INTRAMUSCULAR | Status: DC
  Administered 2017-05-18: 10000 [IU] via INTRAVENOUS

## 2017-05-18 MED ORDER — LEVOFLOXACIN IN D5W 500 MG/100ML IV SOLN: 500.0000 mg | INTRAVENOUS | Status: DC

## 2017-05-18 MED ORDER — LEVOFLOXACIN IN D5W 750 MG/150ML IV SOLN
750.0000 mg | Freq: Once | INTRAVENOUS | Status: AC
  Administered 2017-05-18: 750 mg via INTRAVENOUS
  Filled 2017-05-18: qty 150

## 2017-05-18 NOTE — Care Management (Signed)
Patient informed CM - "I am ready to go.  [facility]  Asked if he wanted to wait for determination before discharging and patient said "No - I want to go today."  Notified Nicolasa DuckingSally Holland CM Director.

## 2017-05-18 NOTE — Progress Notes (Signed)
HD initiated without issue. Patient currently has no complaints. Resting quietly.

## 2017-05-18 NOTE — Progress Notes (Signed)
PT Cancellation Note  Patient Details Name: Eric HalsJohn Yu MRN: [id number] DOB: 1934-07-29   Cancelled Treatment:    Reason Eval/Treat Not Completed: Patient at procedure or test/unavailable. Treatment attempted; pt unavailable, at dialysis. Re attempt later today, as SW requires an updated note before discharge.    Scot DockHeidi E Alisabeth Selkirk, PTA 05/18/2017, 12:47 PM

## 2017-05-18 NOTE — Progress Notes (Signed)
Pre HD  

## 2017-05-18 NOTE — Care Management (Signed)
There is confusion regarding whether the medicare appeal is still in progress. It is reported that patient has informed attending and primary nurse that [facility]  CM spoke directly with patient regarding his appeal and patient relayed that he is wishing to proceed.  CM discussed that even though the appeal is in progress, he can change his mind at anytime and discharge to the skilled nursing facility before the determination.  He is currently receiving his dialysis treatment and off the unit.

## 2017-05-18 NOTE — Progress Notes (Signed)
OT Cancellation Note  Patient Details Name: Lorra HalsJohn Wessells MRN: [id number] DOB: [birth year]   Cancelled Treatment:    Reason Eval/Treat Not Completed: Patient at procedure or test/ unavailable. Pt receiving dialysis. Will re-attempt at later date/time as pt is available.  Richrd PrimeJamie Stiller, MPH, MS, OTR/L ascom [phone] 05/18/17, 10:26 AM

## 2017-05-18 NOTE — Progress Notes (Signed)
Post hd assessment 

## 2017-05-18 NOTE — Progress Notes (Signed)
Physical Therapy Treatment Patient Details Name: Eric Yu MRN: [id number] DOB: 01/04/1934 Today's Date: 05/18/2017    History of Present Illness 83 year old male with history of end-stage renal disease on hemodialysis who presented with generalized weakness and hyperkalemia and noted to have bradycardia. Noted pt has missed his dialysis recently.    PT Comments    Pt agreeable to PT session; reports moderate pain in B distal thighs that is worse with walking. Pt initially on 2 liters O2 with 96% saturation level; removed O2 with 93% saturation on room air. Pt participates in transfer and short ambulation activity with O2 saturation 90-91%. Nursing informed and O2 replaced. Pt demonstrates improved transfers to Min guard with slow, but safe practice. Ambulation with Min guard as well, with subjective report of less than baseline speed. Frail posture noted with ambulation, but no loss of balance or unsafe practice. Pt returns to edge of bed to eat his lunch. Continued recommendation of skilled nursing facility to progress strength, endurance to improve functional mobility to baseline to ultimately return home safely and prevent risk of injury.     Follow Up Recommendations  SNF     Equipment Recommendations  None recommended by PT    Recommendations for Other Services       Precautions / Restrictions Precautions Precautions: Fall Restrictions Weight Bearing Restrictions: No    Mobility  Bed Mobility               General bed mobility comments: Not tested; sitting edge of bed  Transfers Overall transfer level: Needs assistance Equipment used: Rolling walker (2 wheeled) Transfers: Sit to/from Stand Sit to Stand: Min guard         General transfer comment: Mildly slow to rise; safe hand placement. Steady  Ambulation/Gait Ambulation/Gait assistance: Min guard Ambulation Distance (Feet): 25 Feet Assistive device: Rolling walker (2 wheeled) Gait Pattern/deviations:  Step-through pattern;Decreased dorsiflexion - right;Decreased dorsiflexion - left;Trunk flexed;Decreased stride length (knees slightly flexed throughout) Gait velocity: decreased Gait velocity interpretation: <1.8 ft/sec, indicative of risk for recurrent falls General Gait Details: O2 sats on room air 90-91% post ambulation. Much slower than baseline subjectively. Slow, steady, demonstrates safe practice overall. Posture reperesents weakness throughout   Stairs            Wheelchair Mobility    Modified Rankin (Stroke Patients Only)       Balance Overall balance assessment: Needs assistance Sitting-balance support: Feet supported Sitting balance-Leahy Scale: Good     Standing balance support: Bilateral upper extremity supported Standing balance-Leahy Scale: Fair                              Cognition Arousal/Alertness: Awake/alert Behavior During Therapy: WFL for tasks assessed/performed Overall Cognitive Status: Within Functional Limits for tasks assessed                                        Exercises      General Comments        Pertinent Vitals/Pain Pain Assessment: Faces Faces Pain Scale: Hurts little more Pain Location: B distal thighs (states worse while walking) Pain Intervention(s): Monitored during session    Home Living                      Prior Function  PT Goals (current goals can now be found in the care plan section)      Frequency    Min 2X/week      PT Plan Current plan remains appropriate    Co-evaluation              AM-PAC PT "6 Clicks" Daily Activity  Outcome Measure  Difficulty turning over in bed (including adjusting bedclothes, sheets and blankets)?: A Lot Difficulty moving from lying on back to sitting on the side of the bed? : Unable Difficulty sitting down on and standing up from a chair with arms (e.g., wheelchair, bedside commode, etc,.)?: A Little Help  needed moving to and from a bed to chair (including a wheelchair)?: A Little Help needed walking in hospital room?: A Little Help needed climbing 3-5 steps with a railing? : A Lot 6 Click Score: 14    End of Session Equipment Utilized During Treatment: Gait belt Activity Tolerance: Patient tolerated treatment well Patient left: Other (comment);with nursing/sitter in room (sitting edge of bed to use urinal and have lunch)   PT Visit Diagnosis: Unsteadiness on feet (R26.81);Muscle weakness (generalized) (M62.81)     Time: [id number] PT Time Calculation (min) (ACUTE ONLY): 13 min  Charges:  $Gait Training: 8-22 mins                    G Codes:  Functional Assessment Tool Used: AM-PAC 6 Clicks Basic Mobility;Clinical judgement     [name], PTA 05/18/2017, 2:44 PM

## 2017-05-18 NOTE — Progress Notes (Signed)
HD completed without issue. Patient tolerated well.  

## 2017-05-18 NOTE — Clinical Social Work Note (Signed)
Patient to be d/c'ed today to [facility] .  Patient and family agreeable to plans will transport via ems RN to call report.  Tyia Binford, MSW, [name] [phone]  

## 2017-05-18 NOTE — Clinical Social Work Note (Signed)
CSW spoke with patient and he has decided not to continue with the appeal process.  CSW updated case Production designer, theatre/television/filmmanager, physician, bedside nurse, and insurance company.  He said he would like to discharge to [facility] [place] today.  [facility] [place], he will be able to discharge to SNF today.  Patient will be going to room 601, report to be called to Elly ModenaKathy Rogers [phone].  Ervin KnackEric R. Julieann Drummonds, MSW, Theresia MajorsLCSWA [phone]  05/18/2017 3:13 PM

## 2017-05-18 NOTE — Progress Notes (Signed)
[employer] [facility] [place]   PATIENT NAME: Eric Yu    MR#:  [id number]  DATE OF BIRTH:  1933/12/09  SUBJECTIVE:  Patient has appealed discharge Patient did not want to go to rehabilitation yesterday as he felt that he was very weak. He has a cough and feels like he has pneumonia however he has not had any fevers. He is agreeable to going to rehabilitation today after dialysis REVIEW OF SYSTEMS:    Review of Systems  Constitutional: Negative for fever, chills weight loss Generalized weakness HENT: Negative for ear pain, nosebleeds, congestion, facial swelling, rhinorrhea, neck pain, neck stiffness and ear discharge.   Respiratory: Negative for cough, shortness of breath, wheezing  Cardiovascular: Negative for chest pain, palpitations and leg swelling.  Gastrointestinal: Negative for heartburn, abdominal pain, vomiting, diarrhea or consitpation Genitourinary: Negative for dysuria, urgency, frequency, hematuria Musculoskeletal: Negative for back pain or joint pain Neurological: Negative for dizziness, seizures, syncope, focal weakness,  numbness and headaches.  Hematological: Does not bruise/bleed easily.  Psychiatric/Behavioral: Negative for hallucinations, confusion, dysphoric mood    Tolerating Diet: yes      DRUG ALLERGIES:   Allergies  Allergen Reactions  . Cephalexin     Other reaction(s): Other (See Comments) Gross hematuria - occurred once. Not certain whether it was coincidence or causative.    VITALS:  Blood pressure (!) 166/63, pulse 65, temperature 98.4 F (36.9 C), temperature source Oral, resp. rate 16, height 5\' 8"  (1.727 m), weight 70.7 kg (155 lb 12.8 oz), SpO2 96 %.  PHYSICAL EXAMINATION:  Constitutional: AppearsThin and frail. No distress. HENT: Normocephalic. Marland Kitchen Oropharynx is clear and moist.  Eyes: Conjunctivae and EOM are normal. PERRLA, no scleral icterus.  Neck: Normal ROM. Neck supple. No JVD. No tracheal  deviation. CVS: RRR, S1/S2 +, no murmurs, no gallops, no carotid bruit.  Pulmonary: Normal breath sounds without rhonchi rales or wheezing  Abdominal: Soft. BS +,  nondistended nontender no  rebound or guarding.  Musculoskeletal: Normal range of motion. No edema and no tenderness.  Neuro: Alert. CN 2-12 grossly intact. No focal deficits. Skin: Skin is warm and dry. No rash noted. hemodialysis catheter left arm  Psychiatric: Normal mood and affect.      LABORATORY PANEL:   CBC  Recent Labs Lab 05/18/17 0459  WBC 5.1  HGB 8.1*  HCT 24.6*  PLT 175   ------------------------------------------------------------------------------------------------------------------  Chemistries   Recent Labs Lab 05/14/17 1629  05/17/17 0513  NA 137  < > 140  K 5.4*  < > 3.9  CL 107  < > 102  CO2 17*  < > 29  GLUCOSE 116*  < > 79  BUN 88*  < > 20  CREATININE 9.08*  < > 3.89*  CALCIUM 9.3  < > 8.8*  MG 2.4  --   --   AST 14*  --   --   ALT 8*  --   --   ALKPHOS 76  --   --   BILITOT 0.7  --   --   < > = values in this interval not displayed. ------------------------------------------------------------------------------------------------------------------  Cardiac Enzymes  Recent Labs Lab 05/14/17 1629  TROPONINI 0.06*   ------------------------------------------------------------------------------------------------------------------  RADIOLOGY:  Dg Chest 1 View  Result Date: 05/17/2017 CLINICAL DATA:  CHF EXAM: CHEST 1 VIEW COMPARISON:  Three days ago FINDINGS: Unchanged cardiomegaly. Haziness of the lower chest from small pleural effusions. Interstitial opacities bilaterally. No air bronchogram or pneumothorax. Stable aortic and hilar contours.  IMPRESSION: CHF pattern that is unchanged from 3 days ago. Electronically Signed   By: Marnee SpringJonathon  Watts M.D.   On: 05/17/2017 08:46     ASSESSMENT AND PLAN:   83 year old male with history of end-stage renal disease on hemodialysis who  presented with generalized weakness and hyperkalemia and noted to have bradycardia.  1. Symptomatic bradycardia which resolved after hemodialysis and thought to be due to hyperkalemia.  2. Generalized weakness: Physical therapy RECS SNF.  3. Hyperkalemia: This has resolved after dialysis was completed yesterday.  4. End-stage renal disease on hemodialysis: Consultation from nephrology is appreciated.  Patient will receive  dialysis today.   5. HTN Continue hydralazine and losartan.  6. Acute hypoxic resp failure: This is due to fluid overload from end-stage renal disease.  Wean oxygen as tolerated  7. Acute bronchitis: Chest x-ray does not show evidence of pneumonia Will start Levaquin for bronchitis.    Patient has appealed his discharge, however patient agreeable for discharge to skilled nursing facility today after hemodialysis.  Management plans discussed with the patient and he is in agreement.  CODE STATUS: dnr  TOTAL TIME TAKING CARE OF THIS PATIENT: 27 minutes.     POSSIBLE D/C today, DEPENDING ON CLINICAL CONDITION.   Leni Pankonin M.D on 05/18/2017 at 8:32 AM  Between 7am to 6pm - Pager - [phone] After 6pm go to [email or web address] - Social research officer, governmentpassword EPAS [facility]  [facility]  Office  [phone]  CC: Primary care physician; Lauro RegulusAnderson, Marshall W, MD  Note: This dictation was prepared with Dragon dictation along with smaller phrase technology. Any transcriptional errors that result from this process are unintentional.

## 2017-05-18 NOTE — Progress Notes (Signed)
Pre hd 

## 2017-05-18 NOTE — Progress Notes (Signed)
[name]  ROUNDING NOTE   Subjective:   Seen and examined on hemodialysis. Tolerating treatment well. UF of 0.5 liter    HEMODIALYSIS FLOWSHEET:  Blood Flow Rate (mL/min): 350 mL/min Arterial Pressure (mmHg): -120 mmHg Venous Pressure (mmHg): 210 mmHg Transmembrane Pressure (mmHg): 70 mmHg Ultrafiltration Rate (mL/min): 340 mL/min Dialysate Flow Rate (mL/min): 800 ml/min Conductivity: Machine : 14 Conductivity: Machine : 14 Dialysis Fluid Bolus: Normal Saline Bolus Amount (mL): 250 mL Dialysate Change:  (3k 2.5)   Objective:  Vital signs in last 24 hours:  Temp:  [98 F (36.7 C)-98.6 F (37 C)] 98 F (36.7 C) (09/05 1009) Pulse Rate:  [65-83] 70 (09/05 1115) Resp:  [16-21] 19 (09/05 1115) BP: (144-179)/(47-75) 175/62 (09/05 1115) SpO2:  [93 %-100 %] 100 % (09/05 1115) Weight:  [72.3 kg (159 lb 6.4 oz)] 72.3 kg (159 lb 6.4 oz) (09/05 1009)  Weight change:  Filed Weights   05/16/17 0513 05/17/17 0513 05/18/17 1009  Weight: 70.6 kg (155 lb 9.6 oz) 70.7 kg (155 lb 12.8 oz) 72.3 kg (159 lb 6.4 oz)    Intake/Output: I/O last 3 completed shifts: In: 480 [P.O.:480] Out: 1100 [Urine:1100]   Intake/Output this shift:  No intake/output data recorded.  Physical Exam: General: Chronically ill-appearing   Head: Normocephalic, atraumatic. Moist oral mucosal membranes  Eyes: Anicteric  Neck: Supple, trachea midline  Lungs:  Clear to auscultation, normal effort  Heart: S1S2 no rubs  Abdomen:  Soft, nontender, bowel sounds present  Extremities: No peripheral edema.  Neurologic: Awake, alert, following commands  Skin: No lesions  Access: Left upper extremity AV fistula     Basic Metabolic Panel:  Recent Labs Lab 05/14/17 1629 05/14/17 1915 05/15/17 0529 05/16/17 1419 05/17/17 0513  NA 137  --  140  --  140  K 5.4*  --  3.9  --  3.9  CL 107  --  102  --  102  CO2 17*  --  29  --  29  GLUCOSE 116*  --  80  --  79  BUN 88*  --  34*  --  20   CREATININE 9.08*  --  4.67*  --  3.89*  CALCIUM 9.3  --  8.2*  --  8.8*  MG 2.4  --   --   --   --   PHOS  --  4.5  --  3.6  --     Liver Function Tests:  Recent Labs Lab 05/14/17 1629  AST 14*  ALT 8*  ALKPHOS 76  BILITOT 0.7  PROT 7.1  ALBUMIN 3.6   No results for input(s): LIPASE, AMYLASE in the last 168 hours. No results for input(s): AMMONIA in the last 168 hours.  CBC:  Recent Labs Lab 05/14/17 1629 05/15/17 0529 05/18/17 0459  WBC 5.5 4.7 5.1  NEUTROABS 4.0  --   --   HGB 8.9* 7.9* 8.1*  HCT 26.9* 23.8* 24.6*  MCV 88.4 88.1 88.2  PLT 202 171 175    Cardiac Enzymes:  Recent Labs Lab 05/14/17 1629  TROPONINI 0.06*    BNP: Invalid input(s): POCBNP  CBG:  Recent Labs Lab 05/16/17 0813 05/16/17 1149 05/16/17 2046 05/17/17 0754 05/18/17 0729  GLUCAP 76 87 96 67 77    Microbiology: Results for orders placed or performed during the hospital encounter of 05/14/17  MRSA PCR Screening     Status: None   Collection Time: 05/14/17 11:39 PM  Result Value Ref Range Status  MRSA by PCR NEGATIVE NEGATIVE Final    Comment:        The GeneXpert MRSA Assay (FDA approved for NASAL specimens only), is one component of a comprehensive MRSA colonization surveillance program. It is not intended to diagnose MRSA infection nor to guide or monitor treatment for MRSA infections.   Urine culture     Status: Abnormal   Collection Time: 05/16/17  5:24 AM  Result Value Ref Range Status   Specimen Description URINE, RANDOM  Final   Special Requests NONE  Final   Culture (A)  Final    <10,000 COLONIES/mL INSIGNIFICANT GROWTH Performed at [facility], [address], [place], [place] [address]    Report Status 05/17/2017 FINAL  Final    Coagulation Studies: No results for input(s): LABPROT, INR in the last 72 hours.  Urinalysis:  Recent Labs  05/16/17 0524  COLORURINE YELLOW*  LABSPEC 1.011  PHURINE 7.0  GLUCOSEU NEGATIVE  HGBUR  NEGATIVE  BILIRUBINUR NEGATIVE  KETONESUR NEGATIVE  PROTEINUR 100*  NITRITE NEGATIVE  LEUKOCYTESUR NEGATIVE      Imaging: Dg Chest 1 View  Result Date: 05/17/2017 CLINICAL DATA:  CHF EXAM: CHEST 1 VIEW COMPARISON:  Three days ago FINDINGS: Unchanged cardiomegaly. Haziness of the lower chest from small pleural effusions. Interstitial opacities bilaterally. No air bronchogram or pneumothorax. Stable aortic and hilar contours. IMPRESSION: CHF pattern that is unchanged from 3 days ago. Electronically Signed   By: Marnee Spring M.D.   On: 05/17/2017 08:46     Medications:   . levofloxacin (LEVAQUIN) IV     Followed by  . [START ON 05/20/2017] levofloxacin (LEVAQUIN) IV     . albuterol  3 mL Inhalation Daily  . aspirin EC  81 mg Oral BH-q7a  . docusate sodium  100 mg Oral BID  . epoetin (EPOGEN/PROCRIT) injection  4,000 Units Intravenous Q M,W,F-HD  . heparin  5,000 Units Subcutaneous Q8H  . hydrALAZINE  100 mg Oral TID  . losartan  100 mg Oral QHS   acetaminophen **OR** acetaminophen, alteplase, bisacodyl, heparin, HYDROcodone-acetaminophen, ondansetron **OR** ondansetron (ZOFRAN) IV, traZODone  Assessment/ Plan:  83 y.o.black male with end-stage renal disease on hemodialysis, hypertension, depression, COPD, history of gout, chronic anemia who presents with bradycardia, missed dialysis 1 week.    MWF [facility] left [facility].   1. End-stage renal disease: MWF. Hemodialysis treatment today. Tolerating treatment well.  - MWF schedule  2. Anemia of chronic kidney disease: Hemoglobin 8.1 - EPO 10000 with HD treatment  3. Secondary hyperparathyroidism: phosphorus and calcium at goal. Not currently on a binder. PTH at goal, 339.   4. Hypertension:   - Continue losartan and hydralazine.   LOS: 4 Trellis Vanoverbeke 9/5/201811:56 AM

## 2017-05-18 NOTE — Progress Notes (Signed)
Pharmacy Antibiotic Note  [name] is a 83 y.o. male admitted on 05/14/2017 with bronchitis. Pharmacy has been consulted for levofloxacin dosing.  Plan: Will start levofloxacin 750mg  IV x 1, followed by levofloxacin 500mg  IV Q48hr. Recommend treating for total course of 5 days.   Height: 5\' 8"  (172.7 cm) Weight: 155 lb 12.8 oz (70.7 kg) IBW/kg (Calculated) : 68.4  Temp (24hrs), Avg:98.7 F (37.1 C), Min:98.4 F (36.9 C), Max:99.1 F (37.3 C)   Recent Labs Lab 05/14/17 1629 05/15/17 0529 05/17/17 0513 05/18/17 0459  WBC 5.5 4.7  --  5.1  CREATININE 9.08* 4.67* 3.89*  --     Estimated Creatinine Clearance: 14.7 mL/min (A) (by C-G formula based on SCr of 3.89 mg/dL (H)).    Allergies  Allergen Reactions  . Cephalexin     Other reaction(s): Other (See Comments) Gross hematuria - occurred once. Not certain whether it was coincidence or causative.    Antimicrobials this admission: Levofloxacin 9/5 >>   Dose adjustments this admission: N/A  Microbiology results: 9/3 Urine: insignificant growth  9/1 MRSA PCR: negative  Thank you for allowing pharmacy to be a part of this patient's care.  Simpson,Michael L 05/18/2017 9:32 AM

## 2017-05-18 NOTE — Progress Notes (Signed)
Dialysis site clean dry and intact, post dialysis. Packet ready. Report called to [facility]. IV's discontinued and removed. EMS notified for transport.

## 2017-09-13 DEATH — deceased

## 2018-02-05 IMAGING — DX DG CHEST 1V
1 series · 1 of 1 positions shown · non-contrast
Comparison: Three days ago

CLINICAL DATA: CHF

EXAM:
CHEST 1 VIEW

[chest ap]
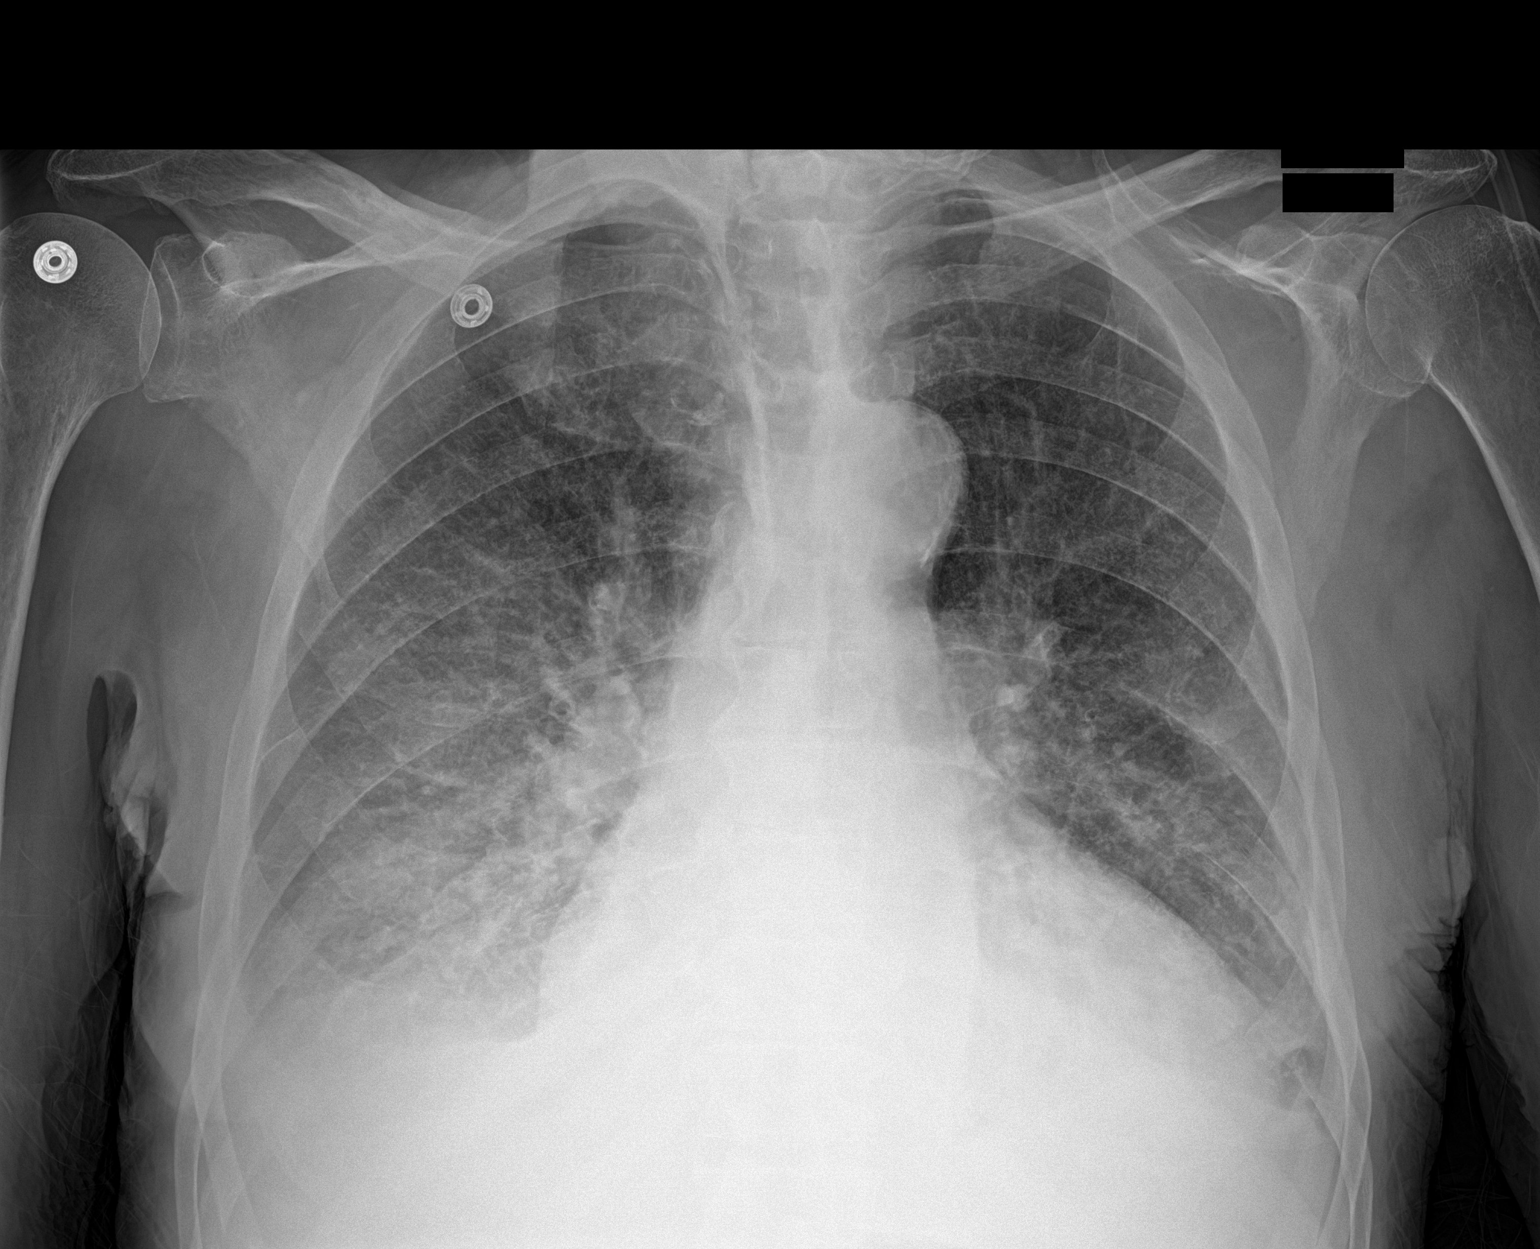

[1 of 1 positions shown; findings below may reference images not displayed]

FINDINGS: Unchanged cardiomegaly. Haziness of the lower chest from small
pleural effusions. Interstitial opacities bilaterally. No air
bronchogram or pneumothorax. Stable aortic and hilar contours.
IMPRESSION: CHF pattern that is unchanged from 3 days ago.
